# Patient Record
Sex: Female | Born: 1955 | Race: Black or African American | Hispanic: No | Smoking: Never smoker
Health system: Southern US, Community
[De-identification: ages and names within clinical notes are randomized; demographics above are authoritative.]

## PROBLEM LIST (undated history)

## (undated) DIAGNOSIS — A0472 Enterocolitis due to Clostridium difficile, not specified as recurrent: Secondary | ICD-10-CM

## (undated) DIAGNOSIS — M1990 Unspecified osteoarthritis, unspecified site: Secondary | ICD-10-CM

## (undated) DIAGNOSIS — I10 Essential (primary) hypertension: Secondary | ICD-10-CM

## (undated) DIAGNOSIS — K5190 Ulcerative colitis, unspecified, without complications: Secondary | ICD-10-CM

## (undated) DIAGNOSIS — E785 Hyperlipidemia, unspecified: Secondary | ICD-10-CM

## (undated) DIAGNOSIS — I219 Acute myocardial infarction, unspecified: Secondary | ICD-10-CM

## (undated) HISTORY — DX: Enterocolitis due to Clostridium difficile, not specified as recurrent: A04.72

## (undated) HISTORY — DX: Hyperlipidemia, unspecified: E78.5

## (undated) HISTORY — PX: TONSILLECTOMY: SUR1361

## (undated) HISTORY — DX: Ulcerative colitis, unspecified, without complications: K51.90

## (undated) HISTORY — DX: Unspecified osteoarthritis, unspecified site: M19.90

## (undated) HISTORY — DX: Acute myocardial infarction, unspecified: I21.9

## (undated) HISTORY — DX: Essential (primary) hypertension: I10

---

## 2008-10-22 HISTORY — PX: TOTAL KNEE ARTHROPLASTY: SHX125

## 2008-10-24 DIAGNOSIS — I219 Acute myocardial infarction, unspecified: Secondary | ICD-10-CM

## 2008-10-24 HISTORY — DX: Acute myocardial infarction, unspecified: I21.9

## 2017-10-05 ENCOUNTER — Encounter: Payer: Self-pay | Admitting: Gastroenterology

## 2017-10-08 ENCOUNTER — Encounter: Payer: Self-pay | Admitting: Gastroenterology

## 2017-10-08 ENCOUNTER — Ambulatory Visit: Payer: BLUE CROSS/BLUE SHIELD | Admitting: Gastroenterology

## 2017-10-08 ENCOUNTER — Other Ambulatory Visit (INDEPENDENT_AMBULATORY_CARE_PROVIDER_SITE_OTHER): Payer: BLUE CROSS/BLUE SHIELD

## 2017-10-08 VITALS — BP 100/70 | HR 104 | Ht 65.5 in | Wt 213.5 lb

## 2017-10-08 DIAGNOSIS — R112 Nausea with vomiting, unspecified: Secondary | ICD-10-CM

## 2017-10-08 DIAGNOSIS — K921 Melena: Secondary | ICD-10-CM

## 2017-10-08 DIAGNOSIS — R197 Diarrhea, unspecified: Secondary | ICD-10-CM

## 2017-10-08 DIAGNOSIS — R1030 Lower abdominal pain, unspecified: Secondary | ICD-10-CM | POA: Diagnosis not present

## 2017-10-08 LAB — BASIC METABOLIC PANEL
BUN: 17 mg/dL (ref 6–23)
CALCIUM: 9.7 mg/dL (ref 8.4–10.5)
CO2: 30 meq/L (ref 19–32)
Chloride: 98 mEq/L (ref 96–112)
Creatinine, Ser: 1.55 mg/dL — ABNORMAL HIGH (ref 0.40–1.20)
GFR: 43.54 mL/min — AB (ref >60.00)
GLUCOSE: 121 mg/dL — AB (ref 70–99)
POTASSIUM: 3.9 meq/L (ref 3.5–5.1)
SODIUM: 136 meq/L (ref 135–145)

## 2017-10-08 LAB — CBC WITH DIFFERENTIAL/PLATELET
BASOS ABS: 0 10*3/uL (ref 0.0–0.1)
Basophils Relative: 0.2 % (ref 0.0–3.0)
EOS PCT: 0.9 % (ref 0.0–5.0)
Eosinophils Absolute: 0.1 10*3/uL (ref 0.0–0.7)
HEMATOCRIT: 38.9 % (ref 36.0–46.0)
Hemoglobin: 13 g/dL (ref 12.0–15.0)
LYMPHS ABS: 1.7 10*3/uL (ref 0.7–4.0)
LYMPHS PCT: 17.3 % (ref 12.0–46.0)
MCHC: 33.3 g/dL (ref 30.0–36.0)
MCV: 90.7 fl (ref 78.0–100.0)
MONOS PCT: 11.1 % (ref 3.0–12.0)
Monocytes Absolute: 1.1 10*3/uL — ABNORMAL HIGH (ref 0.1–1.0)
NEUTROS ABS: 7.1 10*3/uL (ref 1.4–7.7)
NEUTROS PCT: 70.5 % (ref 43.0–77.0)
Platelets: 536 10*3/uL — ABNORMAL HIGH (ref 150.0–400.0)
RBC: 4.29 Mil/uL (ref 3.87–5.11)
RDW: 14.2 % (ref 11.5–15.5)
WBC: 10.1 10*3/uL (ref 4.0–10.5)

## 2017-10-08 LAB — SEDIMENTATION RATE: Sed Rate: 85 mm/hr — ABNORMAL HIGH (ref 0–30)

## 2017-10-08 MED ORDER — ONDANSETRON HCL 4 MG PO TABS: 4.0000 mg | ORAL_TABLET | Freq: Three times a day (TID) | ORAL | 1 refills | Status: AC | PRN

## 2017-10-08 MED ORDER — DICYCLOMINE HCL 10 MG PO CAPS: 10.0000 mg | ORAL_CAPSULE | Freq: Three times a day (TID) | ORAL | 0 refills | Status: AC | PRN

## 2017-10-08 MED ORDER — HYDROCORTISONE ACETATE 25 MG RE SUPP
25.0000 mg | Freq: Two times a day (BID) | RECTAL | 0 refills | Status: DC
Start: 2017-10-08 — End: 2017-10-18

## 2017-10-08 NOTE — Patient Instructions (Signed)
If you are age 62 or older, your body mass index should be between 23-30. Your Body mass index is 34.99 kg/m. If this is out of the aforementioned range listed, please consider follow up with your Primary Care Provider.  If you are age 59 or younger, your body mass index should be between 19-25. Your Body mass index is 34.99 kg/m. If this is out of the aformentioned range listed, please consider follow up with your Primary Care Provider.   Your provider has requested that you go to the basement level for lab work before leaving today. Press "B" on the elevator. The lab is located at the first door on the left as you exit the elevator.  It was a pleasure to see you today!  Dr. Loletha Carrow

## 2017-10-08 NOTE — Progress Notes (Signed)
[facility] Consult Note:  History: [name] 10/08/2017  Referring physician: Self-referred  Reason for consult/chief complaint: Ulcerative Colitis (pt thinks she is having a flare); Diarrhea (blood mixed in with stools); and nausea and vomiting   Subjective  HPI:  This is a very pleasant 62 year old woman self-referred for abdominal pain, vomiting and rectal bleeding.  She moved here several months ago and has not yet established primary care.  She reports a history of "colitis" dating back to perhaps 2011.  It was most recently cared for at the [name].  She gave [place] the name of the last physician she saw there, and unfortunately we have no records available today.  She describes getting "flares" that would occur every few years, and be manifest by crampy lower abdominal pain, protracted nausea and vomiting, sometimes to the point of volume depletion, and bloody diarrhea.  She was treated with antibiotics on at least one occasion for unclear reasons.  When I mention having to test for infection, she did not recall having been positive for C. difficile on one occasion.  At some point she was given prednisone but said it only made her worse.  Her last colonoscopy was in December 2017, and Dr.Cross told her there was "some inflammation".Takhia believe she had a colonoscopy perhaps a year prior to that as well.  She also believes these flares have often been triggered by stress.  About 2 weeks ago she started having the same constellation of symptoms, with crampy lower abdominal pain, bloody loose stool, nausea and vomiting and decreased oral intake.  She has not lately been traveling or had antibiotic use.   ROS:  Review of Systems  Constitutional: Negative for appetite change and unexpected weight change.  HENT: Negative for mouth sores and voice change.   Eyes: Negative for pain and redness.  Respiratory: Negative for cough and shortness of breath.     Cardiovascular: Negative for chest pain and palpitations.  Genitourinary: Negative for dysuria and hematuria.  Musculoskeletal: Positive for arthralgias. Negative for myalgias.  Skin: Negative for pallor and rash.  Neurological: Negative for weakness and headaches.  Hematological: Negative for adenopathy.     Past Medical History: Past Medical History:  Diagnosis Date  . Arthritis   . C. difficile diarrhea   . Heart attack ([place]) 10/24/2008  . HLD (hyperlipidemia)   . HTN (hypertension)   . UC (ulcerative colitis) [facility])      Past Surgical History: Past Surgical History:  Procedure Laterality Date  . CESAREAN SECTION     x 3  . TONSILLECTOMY     age 78  . TOTAL KNEE ARTHROPLASTY Bilateral 10/22/2008     Family History: Family History  Problem Relation Age of Onset  . Stroke Mother   . Congestive Heart Failure Father   . Obesity Sister   . Hypertension Sister   . Diabetes Brother   . Thyroid disease Daughter     Social History: Social History   Socioeconomic History  . Marital status: Unknown    Spouse name: Not on file  . Number of children: 3  . Years of education: Not on file  . Highest education level: Not on file  Occupational History  . Occupation: LPN  Social Needs  . Financial resource strain: Not on file  . Food insecurity:    Worry: Not on file    Inability: Not on file  . Transportation needs:    Medical: Not on file  Non-medical: Not on file  Tobacco Use  . Smoking status: Never Smoker  . Smokeless tobacco: Never Used  Substance and Sexual Activity  . Alcohol use: Not Currently  . Drug use: Never  . Sexual activity: Not on file  Lifestyle  . Physical activity:    Days per week: Not on file    Minutes per session: Not on file  . Stress: Not on file  Relationships  . Social connections:    Talks on phone: Not on file    Gets together: Not on file    Attends religious service: Not on file    Active member of club or organization:  Not on file    Attends meetings of clubs or organizations: Not on file    Relationship status: Not on file  Other Topics Concern  . Not on file  Social History Narrative  . Not on file   She is widowed, and moved here recently to be closer to family before her upcoming retirement. Allergies: Allergies  Allergen Reactions  . Prednisone Other (See Comments)    Tachycardia, nervous, sweating , jitters    Outpatient Meds: Current Outpatient Medications  Medication Sig Dispense Refill  . acetaminophen (TYLENOL) 500 MG tablet Take 500 mg by mouth as needed.    . Calcium Carb-Cholecalciferol (CALCIUM-VITAMIN D) 600-400 MG-UNIT TABS Take 1 tablet by mouth daily.    . metoprolol succinate (TOPROL-XL) 50 MG 24 hr tablet Take 50 mg by mouth daily. Take with or immediately following a meal.    . Multiple Vitamin (MULTIVITAMIN) tablet Take 1 tablet by mouth daily.    Marland Kitchen dicyclomine (BENTYL) 10 MG capsule Take 1 capsule (10 mg total) by mouth every 8 (eight) hours as needed for spasms. 90 capsule 0  . hydrocortisone (ANUSOL-HC) 25 MG suppository Place 1 suppository (25 mg total) rectally 2 (two) times daily. 14 suppository 0  . ondansetron (ZOFRAN) 4 MG tablet Take 1 tablet (4 mg total) by mouth every 8 (eight) hours as needed for nausea or vomiting. 30 tablet 1   No current facility-administered medications for this visit.       ___________________________________________________________________ Objective   Exam:  BP 100/70 (BP Location: Left Arm, Patient Position: Sitting, Cuff Size: Large)   Pulse (!) 104   Ht 5' 5.5" (1.664 m) Comment: height measured without shoes  Wt 213 lb 8 oz (96.8 kg)   BMI 34.99 kg/m    General: this is a(n) pleasant and somewhat anxious appearing, female patient, nontoxic, no acute distress  Eyes: sclera anicteric, no redness  ENT: oral mucosa moist without lesions, no cervical or supraclavicular lymphadenopathy, good dentition  CV: RRR without murmur,  S1/S2, no JVD, no peripheral edema  Resp: clear to auscultation bilaterally, normal RR and effort noted  GI: soft, mild lower tenderness, with active bowel sounds. No guarding or palpable organomegaly noted.  Skin; warm and dry, no rash or jaundice noted.  Venous stasis changes legs  Neuro: awake, alert and oriented x 3. Normal gross motor function and fluent speech Rectal: Discomfort, irregular texture to the rectal mucosa, blood on the glove.  She then had the urgent need for a bowel movement and had to go immediately to the bathroom across the hall, and had semi-formed to loose stool with rust colored blood.  No data for review, no prior records for review  Assessment: Encounter Diagnoses  Name Primary?  . Lower abdominal pain Yes  . Acute diarrhea   . Hematochezia   .  Nausea and vomiting in adult     Reported history of colitis, though not clear if it was truly ulcerative colitis.  If so, she was apparently not on chronic therapy for unknown reasons.  She seemed to recall perhaps had not been prescribed some medicine she took for a while, but then had to stop due to expense.  Unknown how often she was following with GI.  She also reports intolerance to prednisone in the past, and seemed to recall it actually made her worse. She does not appear volume depleted of any acute distress right now.  I am hoping we can get some records soon to understand her history.  I think she is likely need a colonoscopy very soon, but I would like to make sure there is no infection first.  Plan:  Lab today for CBC, BMP, ESR, stool for C. difficile PCR I prescribed ondansetron because she reports it has helped in the past with nausea and vomiting.  Hopefully this will prevent volume depletion and need for emergency room. As needed dicyclomine for cramps and urgency. Hydrocortisone suppository 25 mg twice a day for a week.  By then we will receive lab reports and hopefully some records to know how to  proceed.  Thank you for the courtesy of this consult.  Please call me with any questions or concerns.  Nelida Meuse III

## 2017-10-11 ENCOUNTER — Telehealth: Payer: Self-pay | Admitting: Gastroenterology

## 2017-10-11 ENCOUNTER — Other Ambulatory Visit: Payer: BLUE CROSS/BLUE SHIELD

## 2017-10-11 ENCOUNTER — Other Ambulatory Visit: Payer: Self-pay

## 2017-10-11 DIAGNOSIS — K921 Melena: Secondary | ICD-10-CM

## 2017-10-11 DIAGNOSIS — R197 Diarrhea, unspecified: Secondary | ICD-10-CM

## 2017-10-11 DIAGNOSIS — R1030 Lower abdominal pain, unspecified: Secondary | ICD-10-CM

## 2017-10-11 DIAGNOSIS — R112 Nausea with vomiting, unspecified: Secondary | ICD-10-CM

## 2017-10-11 NOTE — Telephone Encounter (Signed)
Patient turned in specimen this morning wanting to know if the results were available yet. Let her know that I did not see anything completed yet but when we do we will contact her. Also her insurance does not cover the Anusol suppositories and wanted to know what else could be prescribed.

## 2017-10-12 ENCOUNTER — Emergency Department (HOSPITAL_COMMUNITY): Payer: BLUE CROSS/BLUE SHIELD

## 2017-10-12 ENCOUNTER — Encounter (HOSPITAL_COMMUNITY): Payer: Self-pay | Admitting: Emergency Medicine

## 2017-10-12 ENCOUNTER — Other Ambulatory Visit: Payer: Self-pay

## 2017-10-12 ENCOUNTER — Inpatient Hospital Stay (HOSPITAL_COMMUNITY)
Admission: EM | Admit: 2017-10-12 | Discharge: 2017-10-18 | DRG: 386 | Disposition: A | Discharge status: Home / Self Care | Payer: BLUE CROSS/BLUE SHIELD | Attending: Internal Medicine | Admitting: Internal Medicine

## 2017-10-12 DIAGNOSIS — K5190 Ulcerative colitis, unspecified, without complications: Secondary | ICD-10-CM | POA: Diagnosis not present

## 2017-10-12 DIAGNOSIS — E860 Dehydration: Secondary | ICD-10-CM | POA: Diagnosis present

## 2017-10-12 DIAGNOSIS — Z888 Allergy status to other drugs, medicaments and biological substances status: Secondary | ICD-10-CM

## 2017-10-12 DIAGNOSIS — R112 Nausea with vomiting, unspecified: Secondary | ICD-10-CM

## 2017-10-12 DIAGNOSIS — K921 Melena: Secondary | ICD-10-CM | POA: Diagnosis not present

## 2017-10-12 DIAGNOSIS — K625 Hemorrhage of anus and rectum: Secondary | ICD-10-CM | POA: Diagnosis not present

## 2017-10-12 DIAGNOSIS — R739 Hyperglycemia, unspecified: Secondary | ICD-10-CM | POA: Diagnosis present

## 2017-10-12 DIAGNOSIS — E785 Hyperlipidemia, unspecified: Secondary | ICD-10-CM | POA: Diagnosis present

## 2017-10-12 DIAGNOSIS — D72829 Elevated white blood cell count, unspecified: Secondary | ICD-10-CM | POA: Diagnosis present

## 2017-10-12 DIAGNOSIS — K6289 Other specified diseases of anus and rectum: Secondary | ICD-10-CM | POA: Diagnosis not present

## 2017-10-12 DIAGNOSIS — R197 Diarrhea, unspecified: Secondary | ICD-10-CM | POA: Diagnosis not present

## 2017-10-12 DIAGNOSIS — Z79899 Other long term (current) drug therapy: Secondary | ICD-10-CM | POA: Diagnosis not present

## 2017-10-12 DIAGNOSIS — I10 Essential (primary) hypertension: Secondary | ICD-10-CM | POA: Diagnosis present

## 2017-10-12 DIAGNOSIS — R1084 Generalized abdominal pain: Secondary | ICD-10-CM | POA: Diagnosis not present

## 2017-10-12 DIAGNOSIS — I252 Old myocardial infarction: Secondary | ICD-10-CM | POA: Diagnosis not present

## 2017-10-12 DIAGNOSIS — K51811 Other ulcerative colitis with rectal bleeding: Secondary | ICD-10-CM | POA: Diagnosis present

## 2017-10-12 DIAGNOSIS — K51011 Ulcerative (chronic) pancolitis with rectal bleeding: Secondary | ICD-10-CM | POA: Diagnosis not present

## 2017-10-12 DIAGNOSIS — N179 Acute kidney failure, unspecified: Secondary | ICD-10-CM | POA: Diagnosis present

## 2017-10-12 DIAGNOSIS — D473 Essential (hemorrhagic) thrombocythemia: Secondary | ICD-10-CM | POA: Diagnosis not present

## 2017-10-12 DIAGNOSIS — R933 Abnormal findings on diagnostic imaging of other parts of digestive tract: Secondary | ICD-10-CM | POA: Diagnosis not present

## 2017-10-12 DIAGNOSIS — Z96653 Presence of artificial knee joint, bilateral: Secondary | ICD-10-CM | POA: Diagnosis present

## 2017-10-12 DIAGNOSIS — D75839 Thrombocytosis, unspecified: Secondary | ICD-10-CM | POA: Diagnosis present

## 2017-10-12 DIAGNOSIS — K51911 Ulcerative colitis, unspecified with rectal bleeding: Secondary | ICD-10-CM | POA: Diagnosis not present

## 2017-10-12 DIAGNOSIS — K644 Residual hemorrhoidal skin tags: Secondary | ICD-10-CM | POA: Diagnosis not present

## 2017-10-12 DIAGNOSIS — K529 Noninfective gastroenteritis and colitis, unspecified: Secondary | ICD-10-CM | POA: Diagnosis not present

## 2017-10-12 LAB — COMPREHENSIVE METABOLIC PANEL
ALK PHOS: 100 U/L (ref 38–126)
ALT: 14 U/L (ref 0–44)
AST: 16 U/L (ref 15–41)
Albumin: 2.7 g/dL — ABNORMAL LOW (ref 3.5–5.0)
Anion gap: 13 (ref 5–15)
BILIRUBIN TOTAL: 0.7 mg/dL (ref 0.3–1.2)
BUN: 15 mg/dL (ref 8–23)
CALCIUM: 9.6 mg/dL (ref 8.9–10.3)
CO2: 29 mmol/L (ref 22–32)
CREATININE: 1.61 mg/dL — AB (ref 0.44–1.00)
Chloride: 93 mmol/L — ABNORMAL LOW (ref 98–111)
GFR calc non Af Amer: 33 mL/min — ABNORMAL LOW (ref >60)
GFR, EST AFRICAN AMERICAN: 39 mL/min — AB (ref >60)
GLUCOSE: 119 mg/dL — AB (ref 70–99)
Potassium: 4.1 mmol/L (ref 3.5–5.1)
SODIUM: 135 mmol/L (ref 135–145)
Total Protein: 6.6 g/dL (ref 6.5–8.1)

## 2017-10-12 LAB — CBC WITH DIFFERENTIAL/PLATELET
Basophils Absolute: 0 10*3/uL (ref 0.0–0.1)
Basophils Relative: 0 %
EOS ABS: 0.1 10*3/uL (ref 0.0–0.7)
EOS PCT: 1 %
HCT: 42.1 % (ref 36.0–46.0)
Hemoglobin: 14.4 g/dL (ref 12.0–15.0)
LYMPHS ABS: 2.2 10*3/uL (ref 0.7–4.0)
Lymphocytes Relative: 17 %
MCH: 30.8 pg (ref 26.0–34.0)
MCHC: 34.2 g/dL (ref 30.0–36.0)
MCV: 90.1 fL (ref 78.0–100.0)
Monocytes Absolute: 1.2 10*3/uL (ref 0.1–1.0)
Monocytes Relative: 9 %
Neutro Abs: 9.5 10*3/uL (ref 1.7–7.7)
Neutrophils Relative %: 73 %
PLATELETS: 555 10*3/uL — AB (ref 150–400)
RBC: 4.67 MIL/uL (ref 3.87–5.11)
RDW: 13.8 % (ref 11.5–15.5)
WBC: 12.9 10*3/uL — AB (ref 4.0–10.5)

## 2017-10-12 LAB — GASTROINTESTINAL PANEL BY PCR, STOOL (REPLACES STOOL CULTURE)
Adenovirus F40/41: NOT DETECTED
Astrovirus: NOT DETECTED
CAMPYLOBACTER SPECIES: NOT DETECTED
Cryptosporidium: NOT DETECTED
Cyclospora cayetanensis: NOT DETECTED
ENTEROAGGREGATIVE E COLI (EAEC): NOT DETECTED
Entamoeba histolytica: NOT DETECTED
Enteropathogenic E coli (EPEC): NOT DETECTED
Enterotoxigenic E coli (ETEC): NOT DETECTED
Giardia lamblia: NOT DETECTED
NOROVIRUS GI/GII: NOT DETECTED
PLESIMONAS SHIGELLOIDES: NOT DETECTED
Rotavirus A: NOT DETECTED
SALMONELLA SPECIES: NOT DETECTED
SHIGA LIKE TOXIN PRODUCING E COLI (STEC): NOT DETECTED
SHIGELLA/ENTEROINVASIVE E COLI (EIEC): NOT DETECTED
Sapovirus (I, II, IV, and V): NOT DETECTED
Vibrio cholerae: NOT DETECTED
Vibrio species: NOT DETECTED
Yersinia enterocolitica: NOT DETECTED

## 2017-10-12 LAB — C DIFFICILE QUICK SCREEN W PCR REFLEX
C DIFFICILE (CDIFF) INTERP: NOT DETECTED
C DIFFICLE (CDIFF) ANTIGEN: NEGATIVE
C Diff toxin: NEGATIVE

## 2017-10-12 LAB — TYPE AND SCREEN
ABO/RH(D): O POS
Antibody Screen: NEGATIVE

## 2017-10-12 LAB — URINALYSIS, ROUTINE W REFLEX MICROSCOPIC
Bilirubin Urine: NEGATIVE
Glucose, UA: NEGATIVE mg/dL
Hgb urine dipstick: NEGATIVE
KETONES UR: 20 mg/dL — AB
LEUKOCYTES UA: NEGATIVE
NITRITE: NEGATIVE
Protein, ur: NEGATIVE mg/dL
Specific Gravity, Urine: >1.046 — ABNORMAL HIGH (ref 1.005–1.030)
pH: 5 (ref 5.0–8.0)

## 2017-10-12 LAB — SEDIMENTATION RATE: SED RATE: 0 mm/h (ref 0–22)

## 2017-10-12 LAB — MAGNESIUM: MAGNESIUM: 1.8 mg/dL (ref 1.7–2.4)

## 2017-10-12 LAB — LIPASE, BLOOD: Lipase: 22 U/L (ref 11–51)

## 2017-10-12 LAB — CLOSTRIDIUM DIFFICILE TOXIN B, QUALITATIVE, REAL-TIME PCR: CDIFFPCR: NOT DETECTED

## 2017-10-12 LAB — PROTIME-INR
INR: 0.98
PROTHROMBIN TIME: 12.9 s (ref 11.4–15.2)

## 2017-10-12 LAB — I-STAT CG4 LACTIC ACID, ED: LACTIC ACID, VENOUS: 1.82 mmol/L (ref 0.5–1.9)

## 2017-10-12 LAB — OCCULT BLOOD X 1 CARD TO LAB, STOOL: FECAL OCCULT BLD: POSITIVE — AB

## 2017-10-12 LAB — PHOSPHORUS: PHOSPHORUS: 3.8 mg/dL (ref 2.5–4.6)

## 2017-10-12 LAB — ABO/RH: ABO/RH(D): O POS

## 2017-10-12 MED ORDER — ACETAMINOPHEN 500 MG PO TABS
500.0000 mg | ORAL_TABLET | Freq: Four times a day (QID) | ORAL | Status: DC | PRN
  Administered 2017-10-18: 500 mg via ORAL
  Filled 2017-10-12: qty 1

## 2017-10-12 MED ORDER — IOPAMIDOL (ISOVUE-300) INJECTION 61%: 100.0000 mL | Freq: Once | INTRAVENOUS | Status: DC | PRN

## 2017-10-12 MED ORDER — HYDROMORPHONE HCL 1 MG/ML IJ SOLN
1.0000 mg | INTRAMUSCULAR | Status: DC | PRN
  Administered 2017-10-12 – 2017-10-17 (×14): 1 mg via INTRAVENOUS
  Filled 2017-10-12 (×14): qty 1

## 2017-10-12 MED ORDER — FAMOTIDINE IN NACL 20-0.9 MG/50ML-% IV SOLN
20.0000 mg | Freq: Once | INTRAVENOUS | Status: AC
  Administered 2017-10-12: 20 mg via INTRAVENOUS
  Filled 2017-10-12: qty 50

## 2017-10-12 MED ORDER — SODIUM CHLORIDE 0.9 % IV SOLN
INTRAVENOUS | Status: DC
  Administered 2017-10-13: 09:00:00 via INTRAVENOUS

## 2017-10-12 MED ORDER — IOHEXOL 300 MG/ML  SOLN
30.0000 mL | Freq: Once | INTRAMUSCULAR | Status: AC | PRN
  Administered 2017-10-12: 30 mL via ORAL

## 2017-10-12 MED ORDER — DICYCLOMINE HCL 10 MG PO CAPS
10.0000 mg | ORAL_CAPSULE | Freq: Three times a day (TID) | ORAL | Status: DC | PRN
  Filled 2017-10-12: qty 1

## 2017-10-12 MED ORDER — ADULT MULTIVITAMIN W/MINERALS CH
1.0000 | ORAL_TABLET | Freq: Every day | ORAL | Status: DC
  Administered 2017-10-12 – 2017-10-18 (×7): 1 via ORAL
  Filled 2017-10-12 (×7): qty 1

## 2017-10-12 MED ORDER — ONDANSETRON HCL 4 MG/2ML IJ SOLN
4.0000 mg | Freq: Four times a day (QID) | INTRAMUSCULAR | Status: DC | PRN
  Administered 2017-10-12 – 2017-10-18 (×15): 4 mg via INTRAVENOUS
  Filled 2017-10-12 (×15): qty 2

## 2017-10-12 MED ORDER — HYDROCORTISONE ACETATE 25 MG RE SUPP
25.0000 mg | Freq: Two times a day (BID) | RECTAL | Status: DC
  Administered 2017-10-13 – 2017-10-18 (×11): 25 mg via RECTAL
  Filled 2017-10-12 (×14): qty 1

## 2017-10-12 MED ORDER — SODIUM CHLORIDE 0.9 % IV SOLN
1000.0000 mL | INTRAVENOUS | Status: DC
  Administered 2017-10-12: 1000 mL via INTRAVENOUS

## 2017-10-12 MED ORDER — SODIUM CHLORIDE 0.9 % IV BOLUS (SEPSIS)
1000.0000 mL | Freq: Once | INTRAVENOUS | Status: AC
  Administered 2017-10-12: 1000 mL via INTRAVENOUS

## 2017-10-12 MED ORDER — IOHEXOL 300 MG/ML  SOLN
75.0000 mL | Freq: Once | INTRAMUSCULAR | Status: AC | PRN
  Administered 2017-10-12: 75 mL via INTRAVENOUS

## 2017-10-12 MED ORDER — METOPROLOL SUCCINATE ER 50 MG PO TB24
50.0000 mg | ORAL_TABLET | Freq: Every day | ORAL | Status: DC
  Administered 2017-10-12 – 2017-10-18 (×7): 50 mg via ORAL
  Filled 2017-10-12 (×7): qty 1

## 2017-10-12 MED ORDER — ONDANSETRON HCL 4 MG PO TABS
4.0000 mg | ORAL_TABLET | Freq: Four times a day (QID) | ORAL | Status: DC | PRN
  Administered 2017-10-18: 4 mg via ORAL
  Filled 2017-10-12: qty 1

## 2017-10-12 MED ORDER — PEG-KCL-NACL-NASULF-NA ASC-C 100 G PO SOLR
0.5000 | Freq: Once | ORAL | Status: AC
  Administered 2017-10-12: 100 g via ORAL
  Filled 2017-10-12: qty 1

## 2017-10-12 MED ORDER — METOCLOPRAMIDE HCL 5 MG/ML IJ SOLN
10.0000 mg | Freq: Once | INTRAMUSCULAR | Status: AC
  Administered 2017-10-12: 10 mg via INTRAVENOUS
  Filled 2017-10-12: qty 2

## 2017-10-12 MED ORDER — SODIUM CHLORIDE 0.9 % IV SOLN
INTRAVENOUS | Status: DC
  Administered 2017-10-12 – 2017-10-13 (×4): via INTRAVENOUS

## 2017-10-12 MED ORDER — SODIUM CHLORIDE 0.9 % IV BOLUS: 1000.0000 mL | Freq: Once | INTRAVENOUS | Status: DC

## 2017-10-12 MED ORDER — ONDANSETRON HCL 4 MG/2ML IJ SOLN
4.0000 mg | Freq: Once | INTRAMUSCULAR | Status: AC
  Administered 2017-10-12: 4 mg via INTRAVENOUS
  Filled 2017-10-12: qty 2

## 2017-10-12 MED ORDER — HYDROMORPHONE HCL 1 MG/ML IJ SOLN
1.0000 mg | Freq: Once | INTRAMUSCULAR | Status: AC
  Administered 2017-10-12: 1 mg via INTRAVENOUS
  Filled 2017-10-12: qty 1

## 2017-10-12 NOTE — ED Provider Notes (Signed)
[facility] DEPT Provider Note   CSN: [id number] Arrival date & time: 10/12/17  4098     History   Chief Complaint Chief Complaint  Patient presents with  . Rectal Bleeding    HPI [name] is a 62 y.o. female.  HPI Patient reports she has history of ulcerative colitis.  This is been for a number of years.  She has recently moved to this area and was seen by [facility] gastroenterology where evaluation and diagnostic studies are being reinitiated.  Patient reports that she saw them 4 days ago because she has been having a flareup of her symptoms.  She reports this is been going on for about 2 weeks now.  She has had between 10 and 7 bloody diarrheal bowel movements per day.  She has had persistent cramping lower abdominal pain.  She has also had vomiting.  She reports she has been unable to tolerate food or fluids.  There has been a lot of nausea.  Initially she reports her emesis was yellow.  She reports now is mostly clear frothy.  She denies she is had any bloody or coffee-ground looking emesis.  She reports she is gotten very weak.  She has not had a fall or a syncopal episode.  She was prescribed Zofran and Bentyl from gastroenterology.  She reports that she has not been able to keep them down and she continues to have vomiting and abdominal pain.  She was prescribed Anusol which she was not able to start as it was not available at the pharmacy.  She reports 3 prior C-sections and no other intra-abdominal surgeries. Past Medical History:  Diagnosis Date  . Arthritis   . C. difficile diarrhea   . Heart attack ([place]) 10/24/2008  . HLD (hyperlipidemia)   . HTN (hypertension)   . UC (ulcerative colitis) ([place])     There are no active problems to display for this patient.   Past Surgical History:  Procedure Laterality Date  . CESAREAN SECTION     x 3  . TONSILLECTOMY     age 28  . TOTAL KNEE ARTHROPLASTY Bilateral 10/22/2008     OB History   None      Home Medications    Prior to Admission medications   Medication Sig Start Date End Date Taking? Authorizing Provider  acetaminophen (TYLENOL) 500 MG tablet Take 500 mg by mouth every 6 (six) hours as needed (For arthritis pain.).    Yes [provider]  Calcium Carb-Cholecalciferol (CALCIUM-VITAMIN D) 600-400 MG-UNIT TABS Take 1 tablet by mouth daily.   Yes [provider]  dicyclomine (BENTYL) 10 MG capsule Take 1 capsule (10 mg total) by mouth every 8 (eight) hours as needed for spasms. 10/08/17  Yes Danis, Kirke Corin, MD  metoprolol succinate (TOPROL-XL) 50 MG 24 hr tablet Take 50 mg by mouth daily. Take with or immediately following a meal.   Yes [provider]  Multiple Vitamin (MULTIVITAMIN) tablet Take 1 tablet by mouth daily.   Yes [provider]  ondansetron (ZOFRAN) 4 MG tablet Take 1 tablet (4 mg total) by mouth every 8 (eight) hours as needed for nausea or vomiting. 10/08/17  Yes Danis, Kirke Corin, MD  hydrocortisone (ANUSOL-HC) 25 MG suppository Place 1 suppository (25 mg total) rectally 2 (two) times daily. Patient not taking: Reported on 10/12/2017 10/08/17   Doran Stabler, MD    Family History Family History  Problem Relation Age of Onset  .  Stroke Mother   . Congestive Heart Failure Father   . Obesity Sister   . Hypertension Sister   . Diabetes Brother   . Thyroid disease Daughter     Social History Social History   Tobacco Use  . Smoking status: Never Smoker  . Smokeless tobacco: Never Used  Substance Use Topics  . Alcohol use: Not Currently  . Drug use: Never     Allergies   Prednisone   Review of Systems Review of Systems 10 Systems reviewed and are negative for acute change except as noted in the HPI.  Physical Exam Updated Vital Signs BP (!) 134/92   Pulse (!) 107   Temp 99.8 F (37.7 C) (Oral)   Resp 16   Ht 5\' 7"  (1.702 m)   Wt 97.5 kg   SpO2 98%   BMI 33.67 kg/m   Physical Exam   Constitutional: She is oriented to person, place, and time.  Patient is alert and nontoxic.  She does appear slightly pale and uncomfortable.  Mental status is clear.  No respiratory distress.  HENT:  Head: Normocephalic and atraumatic.  Mucous membranes slightly dry.  Posterior oropharynx widely patent.  Eyes: Conjunctivae and EOM are normal.  Neck: Neck supple.  Cardiovascular:  Borderline tachycardia.  No gross rub murmur gallop.  Pulmonary/Chest: Effort normal and breath sounds normal.  Abdominal: Soft. Bowel sounds are normal. She exhibits no distension.  Patient reports lower abdomen is persistently uncomfortable.  She however reports palpation does not make it much worse.  No upper abdominal tenderness to palpation.  No guarding.  Musculoskeletal: Normal range of motion. She exhibits no edema or tenderness.  Neurological: She is alert and oriented to person, place, and time. She exhibits normal muscle tone. Coordination normal.  Skin: Skin is warm and dry.  Psychiatric: She has a normal mood and affect.     ED Treatments / Results  Labs (all labs ordered are listed, but only abnormal results are displayed) Labs Reviewed  COMPREHENSIVE METABOLIC PANEL - Abnormal; Notable for the following components:      Result Value   Chloride 93 (*)    Glucose, Bld 119 (*)    Creatinine, Ser 1.61 (*)    Albumin 2.7 (*)    GFR calc non Af Amer 33 (*)    GFR calc Af Amer 39 (*)    All other components within normal limits  OCCULT BLOOD X 1 CARD TO LAB, STOOL - Abnormal; Notable for the following components:   Fecal Occult Bld POSITIVE (*)    All other components within normal limits  CBC WITH DIFFERENTIAL/PLATELET - Abnormal; Notable for the following components:   WBC 12.9 (*)    Platelets 555 (*)    All other components within normal limits  C DIFFICILE QUICK SCREEN W PCR REFLEX  GASTROINTESTINAL PANEL BY PCR, STOOL (REPLACES STOOL CULTURE)  LIPASE, BLOOD  PROTIME-INR  CBC WITH  DIFFERENTIAL/PLATELET  URINALYSIS, ROUTINE W REFLEX MICROSCOPIC  I-STAT CG4 LACTIC ACID, ED  I-STAT CG4 LACTIC ACID, ED  TYPE AND SCREEN  ABO/RH    EKG EKG Interpretation  Date/Time:  Tuesday October 12 2017 08:42:22 EDT Ventricular Rate:  109 PR Interval:    QRS Duration: 86 QT Interval:  333 QTC Calculation: 449 R Axis:   9 Text Interpretation:  Sinus tachycardia Consider left atrial enlargement Abnormal R-wave progression, early transition LVH by voltage agree, no acute ischemia, no old comparison Confirmed by Charlesetta Shanks [phone]) on 10/12/2017 8:47:13 AM  Radiology Ct Abdomen Pelvis W Contrast  Result Date: 10/12/2017 CLINICAL DATA:  Nausea and vomiting for 2 weeks, history of ulcerative colitis, patient could not drink oral contrast due to vomiting EXAM: CT ABDOMEN AND PELVIS WITH CONTRAST TECHNIQUE: Multidetector CT imaging of the abdomen and pelvis was performed using the standard protocol following bolus administration of intravenous contrast. CONTRAST:  4mL OMNIPAQUE IOHEXOL 300 MG/ML  SOLN COMPARISON:  None. FINDINGS: Lower chest: The lung bases are clear. The heart is within normal limits in size. No pericardial effusion is seen. Hepatobiliary: Tiny subcentimeter low-attenuation structures in the liver most consistent with a benign process such as cysts. No focal hepatic abnormality is seen. The gallbladder is slightly distended but no calcified gallstones are seen. Pancreas: The pancreas is normal in size and the pancreatic duct is not dilated. Spleen: The spleen is unremarkable. Adrenals/Urinary Tract: The adrenal glands appear normal. The kidneys enhance with no mass or calculus. On delayed images, the pelvocaliceal systems are unremarkable. Ureters appear normal in caliber. The urinary bladder is not well distended but no abnormality is noted. Stomach/Bowel: The stomach is completely decompressed and cannot be well assessed. No abnormality of small bowel is seen. The colon  however is completely decompressed and does appear to be somewhat edematous diffusely. Diffuse colitis is a definite consideration. No focal abscess is seen. The terminal ileum and the appendix are unremarkable. Vascular/Lymphatic: The abdominal aorta is normal in caliber. No focal aneurysmal dilatation is seen. No adenopathy is noted. Reproductive: The uterus is normal in size. No adnexal lesion seen with probable tiny follicles on the right. No free fluid is seen within the pelvis. Other: No abdominal wall hernia is seen. Musculoskeletal: There degenerative changes involving both SI joints with sclerosis and irregularity. The lumbar vertebrae are in normal alignment with intervertebral disc spaces appear grossly normal. However considerable degenerative change does involve the facet joints of the mid to lower lumbar spine diffusely. IMPRESSION: 1. Although the colon is completely decompressed, the entire colonic mucosa appears somewhat edematous, suspicious for diffuse colitis. Correlate clinically. No abscess is seen. 2. Degenerative changes throughout the facet joints of the lower lumbar spine and within the SI joints. Electronically Signed   By: Ivar Drape M.D.   On: 10/12/2017 13:25    Procedures Procedures (including critical care time)  Medications Ordered in ED Medications  sodium chloride 0.9 % bolus 1,000 mL (0 mLs Intravenous Stopped 10/12/17 1112)    Followed by  0.9 %  sodium chloride infusion (1,000 mLs Intravenous New Bag/Given 10/12/17 1244)  sodium chloride 0.9 % bolus 1,000 mL (0 mLs Intravenous Hold 10/12/17 1112)  ondansetron (ZOFRAN) injection 4 mg (4 mg Intravenous Given 10/12/17 1037)  famotidine (PEPCID) IVPB 20 mg premix (0 mg Intravenous Stopped 10/12/17 1111)  HYDROmorphone (DILAUDID) injection 1 mg (1 mg Intravenous Given 10/12/17 1037)  iohexol (OMNIPAQUE) 300 MG/ML solution 30 mL (30 mLs Oral Contrast Given 10/12/17 0947)  iohexol (OMNIPAQUE) 300 MG/ML solution 75 mL (75 mLs  Intravenous Contrast Given 10/12/17 1251)     Initial Impression / Assessment and Plan / ED Course  I have reviewed the triage vital signs and the nursing notes.  Pertinent labs & imaging results that were available during my care of the patient were reviewed by me and considered in my medical decision making (see chart for details).  Clinical Course as of Oct 13 1355  Tue Oct 12, 2017  1157 Just informed by nursing staff that the patient's IV blew after getting  her medications and 100 mils of ordered fluids.  Advised that no one is been able to obtain IV and CT has not yet been done.  Will evaluate for ultrasound IV by myself.   [MP]    Clinical Course User Index [MP] Charlesetta Shanks, MD  Recheck: Patient reports feeling much improved in terms of nausea and pain after treatment with Pepcid, Dilaudid and Zofran.  Fluids are infusing.  There was delayed due to difficulty with getting second IV.  Consult: Reviewed with PA for lower GI.  Suggests patient should be admitted for ongoing hydration and management of active ulcerative colitis.  They will do consultation for management.  Final Clinical Impressions(s) / ED Diagnoses   Final diagnoses:  Other ulcerative colitis with rectal bleeding ([place])  Dehydration  Intractable vomiting with nausea, unspecified vomiting type  Patient presents as outlined above.  She has known history of colitis.  She has had 2 weeks of bloody diarrhea.  While in the emergency department she continues to have frequent, small amounts of bloody diarrhea.  Clinically dehydrated with tachycardia and generalized weakness.  She is nontoxic.  CT does not show any abscess or perforation.  Also patient has been made with gastroenterology.  Recommendation is for admission to hospitalist service and they will do consultation for further management.  ED Discharge Orders    None       Charlesetta Shanks, MD 10/12/17 1359

## 2017-10-12 NOTE — Telephone Encounter (Signed)
Patient is currently in the ED for rectal bleeding.

## 2017-10-12 NOTE — ED Notes (Signed)
Unable to initiate an IV, IV team requested

## 2017-10-12 NOTE — Consult Note (Signed)
Consultation  Referring Provider: Dr. Johnney Killian      Primary Care Physician:  Patient, No Pcp Per Primary Gastroenterologist: Dr. Loletha Carrow        Reason for Consultation: Rectal Bleeding           HPI:   [name] is a 62 y.o. female with a past medical history as listed below including Ulcerative Colitis, who presents to the ER today with a complaint of rectal bleeding.    Today, explains that over the past 2 weeks she has had a "flare" of her colitis.  Describes having at least 8 loose bowel movements per day, about 50% of these are bloody associated with lower abdominal pain, described as "stabbing" and constant nausea with episodes of vomiting when she tries to eat anything.  Prior to this patient's normal, about 3 weeks ago, was 2-3 formed bowel movements per day with no abdominal pain.  Has been using Dicyclomine which helps with the cramping and Zofran which does help with the nausea.  Associated symptoms include a weight loss of around 20 pounds in the past 3 weeks per the patient.    Past medical history significant for another episode of colitis back in December 2017 for which she was hospitalized and on Prednisone.  Explains that Prednisone made her heart rate jump into the 160s and made her jittery and anxious.  They had to taper off of this quickly due to the side effects.  Prior to this her last flare was in 2013.    Denies fever or chills, new rashes or new joint pains.  ER work-up: CT abdomen pelvis with contrast showing that the entire colonic mucosa appeared somewhat edematous, suspicious for diffuse colitis, degenerative changes also throughout the facet joints of the lower lumbar spine and within the SI joints  GI History: 10/08/2017 office visit Dr. Loletha Carrow: At that time establishing care for history of ulcerative colitis, diarrhea and nausea and vomiting, apparently recent colonoscopy in December 2017 with "some inflammation", at that time lab including CBC, BMP, ESR and stool  for C. difficile, prescribed Zofran and dicyclomine as well as hydrocortisone suppositories; C. difficile returned negative and Dr. Loletha Carrow thought this was a true flare of her ulcerative colitis, had still not received records from her GI physician, he recommended she have a colonoscopy with him by the next week and she was told to use over-the-counter preparations H suppositories with hydrocortisone as her insurance did not cover Anusol-ESR returned elevated at 85, BMP with a creatinine of 1.55, CBC with elevated platelets at 536  Past Medical History:  Diagnosis Date  . Arthritis   . C. difficile diarrhea   . Heart attack ([place]) 10/24/2008  . HLD (hyperlipidemia)   . HTN (hypertension)   . UC (ulcerative colitis) [facility])     Past Surgical History:  Procedure Laterality Date  . CESAREAN SECTION     x 3  . TONSILLECTOMY     age 61  . TOTAL KNEE ARTHROPLASTY Bilateral 10/22/2008    Family History  Problem Relation Age of Onset  . Stroke Mother   . Congestive Heart Failure Father   . Obesity Sister   . Hypertension Sister   . Diabetes Brother   . Thyroid disease Daughter     Social History   Tobacco Use  . Smoking status: Never Smoker  . Smokeless tobacco: Never Used  Substance Use Topics  . Alcohol use: Not Currently  . Drug use: Never  Prior to Admission medications   Medication Sig Start Date End Date Taking? Authorizing Provider  acetaminophen (TYLENOL) 500 MG tablet Take 500 mg by mouth every 6 (six) hours as needed (For arthritis pain.).    Yes [provider]  Calcium Carb-Cholecalciferol (CALCIUM-VITAMIN D) 600-400 MG-UNIT TABS Take 1 tablet by mouth daily.   Yes [provider]  dicyclomine (BENTYL) 10 MG capsule Take 1 capsule (10 mg total) by mouth every 8 (eight) hours as needed for spasms. 10/08/17  Yes Danis, Kirke Corin, MD  metoprolol succinate (TOPROL-XL) 50 MG 24 hr tablet Take 50 mg by mouth daily. Take with or immediately following a meal.    Yes [provider]  Multiple Vitamin (MULTIVITAMIN) tablet Take 1 tablet by mouth daily.   Yes [provider]  ondansetron (ZOFRAN) 4 MG tablet Take 1 tablet (4 mg total) by mouth every 8 (eight) hours as needed for nausea or vomiting. 10/08/17  Yes Danis, Kirke Corin, MD  hydrocortisone (ANUSOL-HC) 25 MG suppository Place 1 suppository (25 mg total) rectally 2 (two) times daily. Patient not taking: Reported on 10/12/2017 10/08/17   Doran Stabler, MD    Current Facility-Administered Medications  Medication Dose Route Frequency Provider Last Rate Last Dose  . 0.9 %  sodium chloride infusion  1,000 mL Intravenous Continuous Charlesetta Shanks, MD 125 mL/hr at 10/12/17 1244 1,000 mL at 10/12/17 1244  . sodium chloride 0.9 % bolus 1,000 mL  1,000 mL Intravenous Once Charlesetta Shanks, MD   Stopped at 10/12/17 1112   Current Outpatient Medications  Medication Sig Dispense Refill  . acetaminophen (TYLENOL) 500 MG tablet Take 500 mg by mouth every 6 (six) hours as needed (For arthritis pain.).     Marland Kitchen Calcium Carb-Cholecalciferol (CALCIUM-VITAMIN D) 600-400 MG-UNIT TABS Take 1 tablet by mouth daily.    Marland Kitchen dicyclomine (BENTYL) 10 MG capsule Take 1 capsule (10 mg total) by mouth every 8 (eight) hours as needed for spasms. 90 capsule 0  . metoprolol succinate (TOPROL-XL) 50 MG 24 hr tablet Take 50 mg by mouth daily. Take with or immediately following a meal.    . Multiple Vitamin (MULTIVITAMIN) tablet Take 1 tablet by mouth daily.    . ondansetron (ZOFRAN) 4 MG tablet Take 1 tablet (4 mg total) by mouth every 8 (eight) hours as needed for nausea or vomiting. 30 tablet 1  . hydrocortisone (ANUSOL-HC) 25 MG suppository Place 1 suppository (25 mg total) rectally 2 (two) times daily. (Patient not taking: Reported on 10/12/2017) 14 suppository 0    Allergies as of 10/12/2017 - Review Complete 10/12/2017  Allergen Reaction Noted  . Prednisone Other (See Comments) 10/08/2017     Review of  Systems:    Constitutional: No fever or chills Skin: No rash  Cardiovascular: No chest pain   Respiratory: No SOB  Gastrointestinal: See HPI and otherwise negative Genitourinary: No dysuria  Neurological: No headache, dizziness or syncope Musculoskeletal: No new muscle or joint pain Hematologic: No bruising Psychiatric: No history of depression or anxiety    Physical Exam:  Vital signs in last 24 hours: Temp:  [99.8 F (37.7 C)] 99.8 F (37.7 C) (08/20 0831) Pulse Rate:  [42-121] 107 (08/20 1340) Resp:  [16] 16 (08/20 1243) BP: (102-136)/(72-92) 134/92 (08/20 1340) SpO2:  [91 %-100 %] 98 % (08/20 1340) Weight:  [97.5 kg] 97.5 kg (08/20 0833)   General:   Pleasant overweight AA female appears to be in NAD, Well developed, Well nourished, alert  and cooperative Head:  Normocephalic and atraumatic. Eyes:   PEERL, EOMI. No icterus. Conjunctiva pink. Ears:  Normal auditory acuity. Neck:  Supple Throat: Oral cavity and pharynx without inflammation, swelling or lesion. Lungs: Respirations even and unlabored. Lungs clear to auscultation bilaterally.   No wheezes, crackles, or rhonchi.  Heart: Normal S1, S2. No MRG. Regular rate and rhythm. No peripheral edema, cyanosis or pallor.  Abdomen:  Soft, nondistended, nontender (did just receive Dilaudid prior to exam). No rebound or guarding. Normal bowel sounds. No appreciable masses or hepatomegaly. Rectal:  Not performed.  Msk:  Symmetrical without gross deformities.  Extremities:  Without edema, no deformity or joint abnormality.  Neurologic:  Alert and  oriented x4;  grossly normal neurologically. Skin:   Dry and intact without significant lesions or rashes. Psychiatric: Demonstrates good judgement and reason without abnormal affect or behaviors.   LAB RESULTS: Recent Labs    10/12/17 0900  WBC 12.9*  HGB 14.4  HCT 42.1  PLT 555*   BMET Recent Labs    10/12/17 0900  NA 135  K 4.1  CL 93*  CO2 29  GLUCOSE 119*  BUN 15    CREATININE 1.61*  CALCIUM 9.6   LFT Recent Labs    10/12/17 0900  PROT 6.6  ALBUMIN 2.7*  AST 16  ALT 14  ALKPHOS 100  BILITOT 0.7   PT/INR Recent Labs    10/12/17 0900  LABPROT 12.9  INR 0.98    STUDIES: Ct Abdomen Pelvis W Contrast  Result Date: 10/12/2017 CLINICAL DATA:  Nausea and vomiting for 2 weeks, history of ulcerative colitis, patient could not drink oral contrast due to vomiting EXAM: CT ABDOMEN AND PELVIS WITH CONTRAST TECHNIQUE: Multidetector CT imaging of the abdomen and pelvis was performed using the standard protocol following bolus administration of intravenous contrast. CONTRAST:  50m OMNIPAQUE IOHEXOL 300 MG/ML  SOLN COMPARISON:  None. FINDINGS: Lower chest: The lung bases are clear. The heart is within normal limits in size. No pericardial effusion is seen. Hepatobiliary: Tiny subcentimeter low-attenuation structures in the liver most consistent with a benign process such as cysts. No focal hepatic abnormality is seen. The gallbladder is slightly distended but no calcified gallstones are seen. Pancreas: The pancreas is normal in size and the pancreatic duct is not dilated. Spleen: The spleen is unremarkable. Adrenals/Urinary Tract: The adrenal glands appear normal. The kidneys enhance with no mass or calculus. On delayed images, the pelvocaliceal systems are unremarkable. Ureters appear normal in caliber. The urinary bladder is not well distended but no abnormality is noted. Stomach/Bowel: The stomach is completely decompressed and cannot be well assessed. No abnormality of small bowel is seen. The colon however is completely decompressed and does appear to be somewhat edematous diffusely. Diffuse colitis is a definite consideration. No focal abscess is seen. The terminal ileum and the appendix are unremarkable. Vascular/Lymphatic: The abdominal aorta is normal in caliber. No focal aneurysmal dilatation is seen. No adenopathy is noted. Reproductive: The uterus is  normal in size. No adnexal lesion seen with probable tiny follicles on the right. No free fluid is seen within the pelvis. Other: No abdominal wall hernia is seen. Musculoskeletal: There degenerative changes involving both SI joints with sclerosis and irregularity. The lumbar vertebrae are in normal alignment with intervertebral disc spaces appear grossly normal. However considerable degenerative change does involve the facet joints of the mid to lower lumbar spine diffusely. IMPRESSION: 1. Although the colon is completely decompressed, the entire colonic mucosa appears somewhat  edematous, suspicious for diffuse colitis. Correlate clinically. No abscess is seen. 2. Degenerative changes throughout the facet joints of the lower lumbar spine and within the SI joints. Electronically Signed   By: Ivar Drape M.D.   On: 10/12/2017 13:25    Impression / Plan:   Impression: 1.  Hematochezia: with nausea, vomiting and abdominal pain, Cdiff negative, CT as above showing diffuse colitis, history of ulcerative colitis per patient that we do not have records; likely UC flare  Plan: 1.  Scheduled patient for colonoscopy tomorrow with Dr. Rush Landmark.  Did discuss risk, benefits, limitations and alternatives and the patient agrees to proceed. 2.  Patient will do a half of a movi prep tonight starting at 7 PM.  Ordered IV Reglan 20 mg to be given at time of starting prep 3.  Patient will be on clears today and n.p.o. after midnight 4.  We will hold off on starting steroids due to their side effects for the patient last time.  Did discuss that depending on severity of inflammation these may be required.  Or possibly Uceris 5.  Ordered fecal calprotectin and ESR today 6.  Patient was seen with Dr. Rush Landmark.  Further recommendations after procedure tomorrow.  Thank you for your kind consultation, we will continue to follow.  Lavone Nian Lemmon  10/12/2017, 2:04 PM

## 2017-10-12 NOTE — H&P (View-Only) (Signed)
Consultation  Referring Provider: Dr. Johnney Killian      Primary Care Physician:  Patient, No Pcp Per Primary Gastroenterologist: Dr. Loletha Carrow        Reason for Consultation: Rectal Bleeding           HPI:   [name] is a 62 y.o. female with a past medical history as listed below including Ulcerative Colitis, who presents to the ER today with a complaint of rectal bleeding.    Today, explains that over the past 2 weeks she has had a "flare" of her colitis.  Describes having at least 8 loose bowel movements per day, about 50% of these are bloody associated with lower abdominal pain, described as "stabbing" and constant nausea with episodes of vomiting when she tries to eat anything.  Prior to this patient's normal, about 3 weeks ago, was 2-3 formed bowel movements per day with no abdominal pain.  Has been using Dicyclomine which helps with the cramping and Zofran which does help with the nausea.  Associated symptoms include a weight loss of around 20 pounds in the past 3 weeks per the patient.    Past medical history significant for another episode of colitis back in December 2017 for which she was hospitalized and on Prednisone.  Explains that Prednisone made her heart rate jump into the 160s and made her jittery and anxious.  They had to taper off of this quickly due to the side effects.  Prior to this her last flare was in 2013.    Denies fever or chills, new rashes or new joint pains.  ER work-up: CT abdomen pelvis with contrast showing that the entire colonic mucosa appeared somewhat edematous, suspicious for diffuse colitis, degenerative changes also throughout the facet joints of the lower lumbar spine and within the SI joints  GI History: 10/08/2017 office visit Dr. Loletha Carrow: At that time establishing care for history of ulcerative colitis, diarrhea and nausea and vomiting, apparently recent colonoscopy in December 2017 with "some inflammation", at that time lab including CBC, BMP, ESR and stool  for C. difficile, prescribed Zofran and dicyclomine as well as hydrocortisone suppositories; C. difficile returned negative and Dr. Loletha Carrow thought this was a true flare of her ulcerative colitis, had still not received records from her GI physician, he recommended she have a colonoscopy with him by the next week and she was told to use over-the-counter preparations H suppositories with hydrocortisone as her insurance did not cover Anusol-ESR returned elevated at 85, BMP with a creatinine of 1.55, CBC with elevated platelets at 536  Past Medical History:  Diagnosis Date  . Arthritis   . C. difficile diarrhea   . Heart attack ([place]) 10/24/2008  . HLD (hyperlipidemia)   . HTN (hypertension)   . UC (ulcerative colitis) [facility])     Past Surgical History:  Procedure Laterality Date  . CESAREAN SECTION     x 3  . TONSILLECTOMY     age 70  . TOTAL KNEE ARTHROPLASTY Bilateral 10/22/2008    Family History  Problem Relation Age of Onset  . Stroke Mother   . Congestive Heart Failure Father   . Obesity Sister   . Hypertension Sister   . Diabetes Brother   . Thyroid disease Daughter     Social History   Tobacco Use  . Smoking status: Never Smoker  . Smokeless tobacco: Never Used  Substance Use Topics  . Alcohol use: Not Currently  . Drug use: Never  Prior to Admission medications   Medication Sig Start Date End Date Taking? Authorizing Provider  acetaminophen (TYLENOL) 500 MG tablet Take 500 mg by mouth every 6 (six) hours as needed (For arthritis pain.).    Yes [provider]  Calcium Carb-Cholecalciferol (CALCIUM-VITAMIN D) 600-400 MG-UNIT TABS Take 1 tablet by mouth daily.   Yes [provider]  dicyclomine (BENTYL) 10 MG capsule Take 1 capsule (10 mg total) by mouth every 8 (eight) hours as needed for spasms. 10/08/17  Yes Danis, Kirke Corin, MD  metoprolol succinate (TOPROL-XL) 50 MG 24 hr tablet Take 50 mg by mouth daily. Take with or immediately following a meal.    Yes [provider]  Multiple Vitamin (MULTIVITAMIN) tablet Take 1 tablet by mouth daily.   Yes [provider]  ondansetron (ZOFRAN) 4 MG tablet Take 1 tablet (4 mg total) by mouth every 8 (eight) hours as needed for nausea or vomiting. 10/08/17  Yes Danis, Kirke Corin, MD  hydrocortisone (ANUSOL-HC) 25 MG suppository Place 1 suppository (25 mg total) rectally 2 (two) times daily. Patient not taking: Reported on 10/12/2017 10/08/17   Doran Stabler, MD    Current Facility-Administered Medications  Medication Dose Route Frequency Provider Last Rate Last Dose  . 0.9 %  sodium chloride infusion  1,000 mL Intravenous Continuous Charlesetta Shanks, MD 125 mL/hr at 10/12/17 1244 1,000 mL at 10/12/17 1244  . sodium chloride 0.9 % bolus 1,000 mL  1,000 mL Intravenous Once Charlesetta Shanks, MD   Stopped at 10/12/17 1112   Current Outpatient Medications  Medication Sig Dispense Refill  . acetaminophen (TYLENOL) 500 MG tablet Take 500 mg by mouth every 6 (six) hours as needed (For arthritis pain.).     Marland Kitchen Calcium Carb-Cholecalciferol (CALCIUM-VITAMIN D) 600-400 MG-UNIT TABS Take 1 tablet by mouth daily.    Marland Kitchen dicyclomine (BENTYL) 10 MG capsule Take 1 capsule (10 mg total) by mouth every 8 (eight) hours as needed for spasms. 90 capsule 0  . metoprolol succinate (TOPROL-XL) 50 MG 24 hr tablet Take 50 mg by mouth daily. Take with or immediately following a meal.    . Multiple Vitamin (MULTIVITAMIN) tablet Take 1 tablet by mouth daily.    . ondansetron (ZOFRAN) 4 MG tablet Take 1 tablet (4 mg total) by mouth every 8 (eight) hours as needed for nausea or vomiting. 30 tablet 1  . hydrocortisone (ANUSOL-HC) 25 MG suppository Place 1 suppository (25 mg total) rectally 2 (two) times daily. (Patient not taking: Reported on 10/12/2017) 14 suppository 0    Allergies as of 10/12/2017 - Review Complete 10/12/2017  Allergen Reaction Noted  . Prednisone Other (See Comments) 10/08/2017     Review of  Systems:    Constitutional: No fever or chills Skin: No rash  Cardiovascular: No chest pain   Respiratory: No SOB  Gastrointestinal: See HPI and otherwise negative Genitourinary: No dysuria  Neurological: No headache, dizziness or syncope Musculoskeletal: No new muscle or joint pain Hematologic: No bruising Psychiatric: No history of depression or anxiety    Physical Exam:  Vital signs in last 24 hours: Temp:  [99.8 F (37.7 C)] 99.8 F (37.7 C) (08/20 0831) Pulse Rate:  [42-121] 107 (08/20 1340) Resp:  [16] 16 (08/20 1243) BP: (102-136)/(72-92) 134/92 (08/20 1340) SpO2:  [91 %-100 %] 98 % (08/20 1340) Weight:  [97.5 kg] 97.5 kg (08/20 0833)   General:   Pleasant overweight AA female appears to be in NAD, Well developed, Well nourished, alert  and cooperative Head:  Normocephalic and atraumatic. Eyes:   PEERL, EOMI. No icterus. Conjunctiva pink. Ears:  Normal auditory acuity. Neck:  Supple Throat: Oral cavity and pharynx without inflammation, swelling or lesion. Lungs: Respirations even and unlabored. Lungs clear to auscultation bilaterally.   No wheezes, crackles, or rhonchi.  Heart: Normal S1, S2. No MRG. Regular rate and rhythm. No peripheral edema, cyanosis or pallor.  Abdomen:  Soft, nondistended, nontender (did just receive Dilaudid prior to exam). No rebound or guarding. Normal bowel sounds. No appreciable masses or hepatomegaly. Rectal:  Not performed.  Msk:  Symmetrical without gross deformities.  Extremities:  Without edema, no deformity or joint abnormality.  Neurologic:  Alert and  oriented x4;  grossly normal neurologically. Skin:   Dry and intact without significant lesions or rashes. Psychiatric: Demonstrates good judgement and reason without abnormal affect or behaviors.   LAB RESULTS: Recent Labs    10/12/17 0900  WBC 12.9*  HGB 14.4  HCT 42.1  PLT 555*   BMET Recent Labs    10/12/17 0900  NA 135  K 4.1  CL 93*  CO2 29  GLUCOSE 119*  BUN 15    CREATININE 1.61*  CALCIUM 9.6   LFT Recent Labs    10/12/17 0900  PROT 6.6  ALBUMIN 2.7*  AST 16  ALT 14  ALKPHOS 100  BILITOT 0.7   PT/INR Recent Labs    10/12/17 0900  LABPROT 12.9  INR 0.98    STUDIES: Ct Abdomen Pelvis W Contrast  Result Date: 10/12/2017 CLINICAL DATA:  Nausea and vomiting for 2 weeks, history of ulcerative colitis, patient could not drink oral contrast due to vomiting EXAM: CT ABDOMEN AND PELVIS WITH CONTRAST TECHNIQUE: Multidetector CT imaging of the abdomen and pelvis was performed using the standard protocol following bolus administration of intravenous contrast. CONTRAST:  39m OMNIPAQUE IOHEXOL 300 MG/ML  SOLN COMPARISON:  None. FINDINGS: Lower chest: The lung bases are clear. The heart is within normal limits in size. No pericardial effusion is seen. Hepatobiliary: Tiny subcentimeter low-attenuation structures in the liver most consistent with a benign process such as cysts. No focal hepatic abnormality is seen. The gallbladder is slightly distended but no calcified gallstones are seen. Pancreas: The pancreas is normal in size and the pancreatic duct is not dilated. Spleen: The spleen is unremarkable. Adrenals/Urinary Tract: The adrenal glands appear normal. The kidneys enhance with no mass or calculus. On delayed images, the pelvocaliceal systems are unremarkable. Ureters appear normal in caliber. The urinary bladder is not well distended but no abnormality is noted. Stomach/Bowel: The stomach is completely decompressed and cannot be well assessed. No abnormality of small bowel is seen. The colon however is completely decompressed and does appear to be somewhat edematous diffusely. Diffuse colitis is a definite consideration. No focal abscess is seen. The terminal ileum and the appendix are unremarkable. Vascular/Lymphatic: The abdominal aorta is normal in caliber. No focal aneurysmal dilatation is seen. No adenopathy is noted. Reproductive: The uterus is  normal in size. No adnexal lesion seen with probable tiny follicles on the right. No free fluid is seen within the pelvis. Other: No abdominal wall hernia is seen. Musculoskeletal: There degenerative changes involving both SI joints with sclerosis and irregularity. The lumbar vertebrae are in normal alignment with intervertebral disc spaces appear grossly normal. However considerable degenerative change does involve the facet joints of the mid to lower lumbar spine diffusely. IMPRESSION: 1. Although the colon is completely decompressed, the entire colonic mucosa appears somewhat  edematous, suspicious for diffuse colitis. Correlate clinically. No abscess is seen. 2. Degenerative changes throughout the facet joints of the lower lumbar spine and within the SI joints. Electronically Signed   By: Ivar Drape M.D.   On: 10/12/2017 13:25    Impression / Plan:   Impression: 1.  Hematochezia: with nausea, vomiting and abdominal pain, Cdiff negative, CT as above showing diffuse colitis, history of ulcerative colitis per patient that we do not have records; likely UC flare  Plan: 1.  Scheduled patient for colonoscopy tomorrow with Dr. Rush Landmark.  Did discuss risk, benefits, limitations and alternatives and the patient agrees to proceed. 2.  Patient will do a half of a movi prep tonight starting at 7 PM.  Ordered IV Reglan 20 mg to be given at time of starting prep 3.  Patient will be on clears today and n.p.o. after midnight 4.  We will hold off on starting steroids due to their side effects for the patient last time.  Did discuss that depending on severity of inflammation these may be required.  Or possibly Uceris 5.  Ordered fecal calprotectin and ESR today 6.  Patient was seen with Dr. Rush Landmark.  Further recommendations after procedure tomorrow.  Thank you for your kind consultation, we will continue to follow.  Lavone Nian Maizee Reinhold  10/12/2017, 2:04 PM

## 2017-10-12 NOTE — ED Notes (Signed)
Pt to CT

## 2017-10-12 NOTE — ED Triage Notes (Signed)
Pt has history of ulcerative colitis last flare up was in december 2017. For the past two weeks patient has had bloody stools. Last one was 8/20 at 6 am  Medication given by her primary dr is PO and she is unable to keep anything down.

## 2017-10-12 NOTE — H&P (Signed)
History and Physical    [name] RFF:[id number] DOB: Aug 24, 1955 DOA: 10/12/2017  PCP: Patient, No Pcp Per   Patient coming from: Home  Chief Complaint: Bloody Diarrhea x 2 weeks, Nausea, Vomiting and Sharp Abdominal Pain   HPI: [name] is a 62 y.o. female with medical history significant of hypertension, hyperlipidemia no longer taking her statin, history of ulcerative colitis, history of MI after bilateral knee surgery likely attributed to blood loss, history of arthritis, and history of C. difficile seal diarrhea and other comorbidities who presents with a chief complaint of bloody diarrhea for last [redacted] weeks along associated persistent sharp abdominal pain along with nausea and vomiting.  Patient states that symptoms started 2 weeks ago and patient feels very similar to previous UC  flare. Started with bloody diarrhea and averageing 10-11 bowel movements and now dow to 8. Nausea and vomiting started a few days after the diarrhea. Had SOB. No CP and no Lightheadeness or dizziness.  Feels weak and Fatigued. Abominal Pain started same time as diarrhea and lower abdominal pain 7/10 and persistent. Sharp stabbing pain.  No current pain right now as she received IV Dilaudid.  Recently saw Dr. Loletha Carrow of GI who started her on prednisone suppositories however she did not pick these up yet.  Because of symptoms persisting she presented to the ED for further evaluation.  [facility] was called to admit this patient for a acute ulcerative colitis flare and gastroenterology was consulted for further evaluation and recommendations and are planning to take the patient for colonoscopy in a.m.  ED Course: Had basic blood work done including a C. difficile as well as a GI pathogen panel.  She also had a CT of the abdomen pelvis done along with a twelve-lead EKG. lactic acid level was normal  Review of Systems: As per HPI otherwise 10 point review of systems negative.   Past Medical History:  Diagnosis Date  .  Arthritis   . C. difficile diarrhea   . Heart attack ([place]) 10/24/2008  . HLD (hyperlipidemia)   . HTN (hypertension)   . UC (ulcerative colitis) ([place])   -MI in September 10th 2010 after Surgery and lost a lot of blood  Past Surgical History:  Procedure Laterality Date  . CESAREAN SECTION     x 3  . TONSILLECTOMY     age 9  . TOTAL KNEE ARTHROPLASTY Bilateral 10/22/2008   SOCIAL HISTORY   reports that she has never smoked. She has never used smokeless tobacco. She reports that she drank alcohol. She reports that she does not use drugs.  Allergies  Allergen Reactions  . Prednisone Other (See Comments)    Tachycardia, nervous, sweating , jitters   Family History  Problem Relation Age of Onset  . Stroke Mother   . Congestive Heart Failure Father   . Obesity Sister   . Hypertension Sister   . Diabetes Brother   . Thyroid disease Daughter   -Both Parents Deceased  Prior to Admission medications   Medication Sig Start Date End Date Taking? Authorizing Provider  acetaminophen (TYLENOL) 500 MG tablet Take 500 mg by mouth every 6 (six) hours as needed (For arthritis pain.).    Yes [provider]  Calcium Carb-Cholecalciferol (CALCIUM-VITAMIN D) 600-400 MG-UNIT TABS Take 1 tablet by mouth daily.   Yes [provider]  dicyclomine (BENTYL) 10 MG capsule Take 1 capsule (10 mg total) by mouth every 8 (eight) hours as needed for spasms. 10/08/17  Yes Nelida Meuse  III, MD  metoprolol succinate (TOPROL-XL) 50 MG 24 hr tablet Take 50 mg by mouth daily. Take with or immediately following a meal.   Yes [provider]  Multiple Vitamin (MULTIVITAMIN) tablet Take 1 tablet by mouth daily.   Yes [provider]  ondansetron (ZOFRAN) 4 MG tablet Take 1 tablet (4 mg total) by mouth every 8 (eight) hours as needed for nausea or vomiting. 10/08/17  Yes Danis, Kirke Corin, MD  hydrocortisone (ANUSOL-HC) 25 MG suppository Place 1 suppository (25 mg total) rectally 2  (two) times daily. Patient not taking: Reported on 10/12/2017 10/08/17   Doran Stabler, MD   Physical Exam: Vitals:   10/12/17 1420 10/12/17 1440 10/12/17 1500 10/12/17 1520  BP: 118/66 (!) 113/94 (!) 135/92 (!) 135/101  Pulse: 99 100 100 (!) 105  Resp:   16   Temp:      TempSrc:      SpO2: 96% 99% 96% 97%  Weight:      Height:       Constitutional: WN/WD obese AAF in NAD and appears calm and comfortable Eyes: Lids and conjunctivae normal, sclerae anicteric  ENMT: External Ears, Nose appear normal. Grossly normal hearing. Mucous membranes are slightly dry  Neck: Appears normal, supple, no cervical masses, normal ROM, no appreciable thyromegaly, no JVD Respiratory: Diminished to auscultation bilaterally, no wheezing, rales, rhonchi or crackles. Normal respiratory effort and patient is not tachypenic. No accessory muscle use.  Cardiovascular: Tachycardic Rate but Regular Rhythm, no murmurs / rubs / gallops. S1 and S2 auscultated. No extremity edema. 2 Abdomen: Soft, slightly tender, Distended due to body habitus. No masses palpated. No appreciable hepatosplenomegaly. Bowel sounds positive x4.  GU: Deferred. Musculoskeletal: No clubbing / cyanosis of digits/nails. No joint deformity upper and lower extremities. Good ROM, no contractures.  Skin: No rashes, lesions, ulcers on a limited skin evaluation. Has abdominal scar from C Section and Knee scars from arthroplasty. No induration; Warm and dry.  Neurologic: CN 2-12 grossly intact with no focal deficits. Sensation intact in all 4 Extremities, DTR normal. Strength 5/5 in all 4. Romberg sign cerebellar reflexes not assessed.  Psychiatric: Normal judgment and insight. Alert and oriented x 3. Normal mood and appropriate affect.   Labs on Admission: I have personally reviewed following labs and imaging studies  CBC: Recent Labs  Lab 10/08/17 1117 10/12/17 0900  WBC 10.1 12.9*  NEUTROABS 7.1 9.5  HGB 13.0 14.4  HCT 38.9 42.1  MCV  90.7 90.1  PLT 536.0* 350*   Basic Metabolic Panel: Recent Labs  Lab 10/08/17 1117 10/12/17 0900  NA 136 135  K 3.9 4.1  CL 98 93*  CO2 30 29  GLUCOSE 121* 119*  BUN 17 15  CREATININE 1.55* 1.61*  CALCIUM 9.7 9.6   GFR: Estimated Creatinine Clearance: 43.5 mL/min (A) (by C-G formula based on SCr of 1.61 mg/dL (H)). Liver Function Tests: Recent Labs  Lab 10/12/17 0900  AST 16  ALT 14  ALKPHOS 100  BILITOT 0.7  PROT 6.6  ALBUMIN 2.7*   Recent Labs  Lab 10/12/17 0900  LIPASE 22   No results for input(s): AMMONIA in the last 168 hours. Coagulation Profile: Recent Labs  Lab 10/12/17 0900  INR 0.98   Cardiac Enzymes: No results for input(s): CKTOTAL, CKMB, CKMBINDEX, TROPONINI in the last 168 hours. BNP (last 3 results) No results for input(s): PROBNP in the last 8760 hours. HbA1C: No results for input(s): HGBA1C in the last 72 hours.  CBG: No results for input(s): GLUCAP in the last 168 hours. Lipid Profile: No results for input(s): CHOL, HDL, LDLCALC, TRIG, CHOLHDL, LDLDIRECT in the last 72 hours. Thyroid Function Tests: No results for input(s): TSH, T4TOTAL, FREET4, T3FREE, THYROIDAB in the last 72 hours. Anemia Panel: No results for input(s): VITAMINB12, FOLATE, FERRITIN, TIBC, IRON, RETICCTPCT in the last 72 hours. Urine analysis: No results found for: COLORURINE, APPEARANCEUR, LABSPEC, PHURINE, GLUCOSEU, HGBUR, BILIRUBINUR, KETONESUR, PROTEINUR, UROBILINOGEN, NITRITE, LEUKOCYTESUR Sepsis Labs: !!!!!!!!!!!!!!!!!!!!!!!!!!!!!!!!!!!!!!!!!!!! @LABRCNTIP (procalcitonin:4,lacticidven:4) ) Recent Results (from the past 240 hour(s))  Gastrointestinal Panel by PCR , Stool     Status: None   Collection Time: 10/12/17  9:04 AM  Result Value Ref Range Status   Campylobacter species NOT DETECTED NOT DETECTED Final   Plesimonas shigelloides NOT DETECTED NOT DETECTED Final   Salmonella species NOT DETECTED NOT DETECTED Final   Yersinia enterocolitica NOT DETECTED NOT  DETECTED Final   Vibrio species NOT DETECTED NOT DETECTED Final   Vibrio cholerae NOT DETECTED NOT DETECTED Final   Enteroaggregative E coli (EAEC) NOT DETECTED NOT DETECTED Final   Enteropathogenic E coli (EPEC) NOT DETECTED NOT DETECTED Final   Enterotoxigenic E coli (ETEC) NOT DETECTED NOT DETECTED Final   Shiga like toxin producing E coli (STEC) NOT DETECTED NOT DETECTED Final   Shigella/Enteroinvasive E coli (EIEC) NOT DETECTED NOT DETECTED Final   Cryptosporidium NOT DETECTED NOT DETECTED Final   Cyclospora cayetanensis NOT DETECTED NOT DETECTED Final   Entamoeba histolytica NOT DETECTED NOT DETECTED Final   Giardia lamblia NOT DETECTED NOT DETECTED Final   Adenovirus F40/41 NOT DETECTED NOT DETECTED Final   Astrovirus NOT DETECTED NOT DETECTED Final   Norovirus GI/GII NOT DETECTED NOT DETECTED Final   Rotavirus A NOT DETECTED NOT DETECTED Final   Sapovirus (I, II, IV, and V) NOT DETECTED NOT DETECTED Final    Comment: Performed at [facility], [address] [place], [place] [address]  C difficile quick scan w PCR reflex     Status: None   Collection Time: 10/12/17  9:04 AM  Result Value Ref Range Status   C Diff antigen NEGATIVE NEGATIVE Final   C Diff toxin NEGATIVE NEGATIVE Final   C Diff interpretation No C. difficile detected.  Final    Comment: Performed at [facility] : [facility], [place] [address] [place], [place] [address]    Radiological Exams on Admission: Ct Abdomen Pelvis W Contrast  Result Date: 10/12/2017 CLINICAL DATA:  Nausea and vomiting for 2 weeks, history of ulcerative colitis, patient could not drink oral contrast due to vomiting EXAM: CT ABDOMEN AND PELVIS WITH CONTRAST TECHNIQUE: Multidetector CT imaging of the abdomen and pelvis was performed using the standard protocol following bolus administration of intravenous contrast. CONTRAST:  45mL OMNIPAQUE IOHEXOL 300 MG/ML  SOLN COMPARISON:  None. FINDINGS: Lower chest: The lung bases are  clear. The heart is within normal limits in size. No pericardial effusion is seen. Hepatobiliary: Tiny subcentimeter low-attenuation structures in the liver most consistent with a benign process such as cysts. No focal hepatic abnormality is seen. The gallbladder is slightly distended but no calcified gallstones are seen. Pancreas: The pancreas is normal in size and the pancreatic duct is not dilated. Spleen: The spleen is unremarkable. Adrenals/Urinary Tract: The adrenal glands appear normal. The kidneys enhance with no mass or calculus. On delayed images, the pelvocaliceal systems are unremarkable. Ureters appear normal in caliber. The urinary bladder is not well distended but no abnormality is noted. Stomach/Bowel: The stomach is  completely decompressed and cannot be well assessed. No abnormality of small bowel is seen. The colon however is completely decompressed and does appear to be somewhat edematous diffusely. Diffuse colitis is a definite consideration. No focal abscess is seen. The terminal ileum and the appendix are unremarkable. Vascular/Lymphatic: The abdominal aorta is normal in caliber. No focal aneurysmal dilatation is seen. No adenopathy is noted. Reproductive: The uterus is normal in size. No adnexal lesion seen with probable tiny follicles on the right. No free fluid is seen within the pelvis. Other: No abdominal wall hernia is seen. Musculoskeletal: There degenerative changes involving both SI joints with sclerosis and irregularity. The lumbar vertebrae are in normal alignment with intervertebral disc spaces appear grossly normal. However considerable degenerative change does involve the facet joints of the mid to lower lumbar spine diffusely. IMPRESSION: 1. Although the colon is completely decompressed, the entire colonic mucosa appears somewhat edematous, suspicious for diffuse colitis. Correlate clinically. No abscess is seen. 2. Degenerative changes throughout the facet joints of the lower  lumbar spine and within the SI joints. Electronically Signed   By: Ivar Drape M.D.   On: 10/12/2017 13:25   EKG: Independently reviewed.  Shows sinus tachycardia rate 109 with no ST elevation on my interpretation patient did have some LVH though.  Assessment/Plan Active Problems:   Ulcerative colitis, acute (HCC)   History of myocardial infarction   AKI (acute kidney injury) (HCC)   Hyperglycemia   Hyperlipidemia   Thrombocytosis (HCC)   Leukocytosis  Bloody diarrhea for the last 2 weeks indicative of a ulcerative colitis flare -As an inpatient -Symptomatic UC and has had nausea, vomiting, abdominal pain and bloody diarrhea -FOBT was positive- -C. difficile and GI pathogen panel negative -CT abdomen and pelvis done and showed the colon is completely decompressed, the entire colonic  mucosa appears somewhat edematous, suspicious for diffuse colitis. No abscess is seen. There were Degenerative changes throughout the facet joints of the lowerlumbar spine and within the SI joints. -Saw [facility] Dr. Loletha Carrow who had prescribed her Anusol -C/w IVFluids and Symptom Control  -Patient defers Steroids at this time -Colonoscopy scheduled for tomorrow   Hx of with MI (Assume it was Type 2 NSTEMI) -In the Setting of Prior Blood Loss for Bilateral Knee Arthroplasty -C/w Metoprolol Succinate 50 mg po qHS -Will not monitor on telemetry as patient is hemodynamically stable  AKI -From blood Loss and Dehydration -Continue with IV fluid hydration with normal saline -UN/creatinine on admission was 15/1.61 -Avoid nephrotoxic medications possible -Repeat CMP in the a.m.  Hyperglycemia -Likely reactive but will rule out diabetes and check hemoglobin A1c -If blood sugars are persistently elevated will place on sensitive NovoLog sliding scale  Hyperlipidemia -No longer taking pravastatin -Check lipid panel in a.m.  Thrombocytosis  -Likely reactive to UC flare -Platelet count on admission was  555 -Continue to monitor and trend and repeat CBC in a.m.  Leukocytosis  -The setting of UC flare -WBC went from 10.1 is now 12.9 -Likely in the setting of dehydration, nausea, vomiting and abdominal pain from UC flare -Continue to monitor for signs and symptoms of infection -Repeat CBC in a.m.  DVT prophylaxis: SCDs Code Status: FULL CODE Family Communication: Discussed with aunt who was present at bedside Disposition Plan: Anticipate discharge home when medically stable and cleared by GI Consults called: Gastroenterology  Admission status: Inpatient MedSurg  Severity of Illness: The appropriate patient status for this patient is INPATIENT. Inpatient status is judged to be reasonable and  necessary in order to provide the required intensity of service to ensure the patient's safety. The patient's presenting symptoms, physical exam findings, and initial radiographic and laboratory data in the context of their chronic comorbidities is felt to place them at high risk for further clinical deterioration. Furthermore, it is not anticipated that the patient will be medically stable for discharge from the hospital within 2 midnights of admission. The following factors support the patient status of inpatient.   " The patient's presenting symptoms include nausea, vomiting, abdominal pain and bloody diarrhea for 2 weeks. " The worrisome physical exam findings include mild abdominal discomfort. " The initial radiographic and laboratory data are worrisome because of suspected UC flare and AKI. " The chronic co-morbidities include hypertension, hyperlipidemia, ulcerative colitis  * I certify that at the point of admission it is my clinical judgment that the patient will require inpatient hospital care spanning beyond 2 midnights from the point of admission due to high intensity of service, high risk for further deterioration and high frequency of surveillance required.Kerney Elbe, D.O. [facility] PAGER is on [place]  If 7PM-7AM, please contact night-coverage [email or web address] Password [facility]  10/12/2017, 3:42 PM

## 2017-10-12 NOTE — ED Notes (Signed)
ED TO INPATIENT HANDOFF REPORT  Name/Age/Gender [name] 62 y.o. female  Code Status Advance Directive Documentation     Most Recent Value  Type of Advance Directive  Living will [has one for state of [place]]  Pre-existing out of facility DNR order (yellow form or pink MOST form)  -  "MOST" Form in Place?  -      Home/SNF/Other Home  Chief Complaint Bloody Diarrhea; Unable to Eat  Level of Care/Admitting Diagnosis ED Disposition    ED Disposition Condition [facility] Area: [facility] [[id number]]  Level of Care: Med-Surg [16]  Diagnosis: Ulcerative colitis [facility]) [[id number]]  Admitting Physician: Raiford Noble LATIF [[id number]]  Attending Physician: Raiford Noble LATIF [[id number]]  Estimated length of stay: past midnight tomorrow  Certification:: I certify this patient will need inpatient services for at least 2 midnights  PT Class (Do Not Modify): Inpatient [101]  PT Acc Code (Do Not Modify): Private [1]       Medical History Past Medical History:  Diagnosis Date  . Arthritis   . C. difficile diarrhea   . Heart attack ([place]) 10/24/2008  . HLD (hyperlipidemia)   . HTN (hypertension)   . UC (ulcerative colitis) (HCC)     Allergies Allergies  Allergen Reactions  . Prednisone Other (See Comments)    Tachycardia, nervous, sweating , jitters    IV Location/Drains/Wounds Patient Lines/Drains/Airways Status   Active Line/Drains/Airways    Name:   Placement date:   Placement time:   Site:   Days:   Peripheral IV 10/12/17 Left Antecubital   10/12/17    1212    Antecubital   less than 1          Labs/Imaging Results for orders placed or performed during the hospital encounter of 10/12/17 (from the past 48 hour(s))  Comprehensive metabolic panel     Status: Abnormal   Collection Time: 10/12/17  9:00 AM  Result Value Ref Range   Sodium 135 135 - 145 mmol/L   Potassium 4.1 3.5 - 5.1 mmol/L   Chloride 93 (L) 98 - 111 mmol/L   CO2  29 22 - 32 mmol/L   Glucose, Bld 119 (H) 70 - 99 mg/dL   BUN 15 8 - 23 mg/dL   Creatinine, Ser 1.61 (H) 0.44 - 1.00 mg/dL   Calcium 9.6 8.9 - 10.3 mg/dL   Total Protein 6.6 6.5 - 8.1 g/dL   Albumin 2.7 (L) 3.5 - 5.0 g/dL   AST 16 15 - 41 U/L   ALT 14 0 - 44 U/L   Alkaline Phosphatase 100 38 - 126 U/L   Total Bilirubin 0.7 0.3 - 1.2 mg/dL   GFR calc non Af Amer 33 (L) >60 mL/min   GFR calc Af Amer 39 (L) >60 mL/min    Comment: (NOTE) The eGFR has been calculated using the CKD EPI equation. This calculation has not been validated in all clinical situations. eGFR's persistently <60 mL/min signify possible Chronic Kidney Disease.    Anion gap 13 5 - 15    Comment: Performed at [facility], [place] [address], [place], [place] [address]  Lipase, blood     Status: None   Collection Time: 10/12/17  9:00 AM  Result Value Ref Range   Lipase 22 11 - 51 U/L    Comment: Performed at [facility], [place] [address] [place], [place] [address]  Protime-INR     Status: None   Collection  Time: 10/12/17  9:00 AM  Result Value Ref Range   Prothrombin Time 12.9 11.4 - 15.2 seconds   INR 0.98     Comment: Performed at [facility], [place] [address] [place], [place] [address]  CBC with Differential/Platelet     Status: Abnormal   Collection Time: 10/12/17  9:00 AM  Result Value Ref Range   WBC 12.9 (H) 4.0 - 10.5 K/uL    Comment: WHITE COUNT CONFIRMED ON SMEAR   RBC 4.67 3.87 - 5.11 MIL/uL   Hemoglobin 14.4 12.0 - 15.0 g/dL   HCT 42.1 36.0 - 46.0 %   MCV 90.1 78.0 - 100.0 fL   MCH 30.8 26.0 - 34.0 pg   MCHC 34.2 30.0 - 36.0 g/dL   RDW 13.8 11.5 - 15.5 %   Platelets 555 (H) 150 - 400 K/uL    Comment: SPECIMEN CHECKED FOR CLOTS REPEATED TO VERIFY PLATELET COUNT CONFIRMED BY SMEAR    Neutrophils Relative % 73 %   Neutro Abs 9.5 1.7 - 7.7 K/uL   Lymphocytes Relative 17 %   Lymphs Abs 2.2 0.7 - 4.0 K/uL   Monocytes Relative 9 %   Monocytes  Absolute 1.2 0.1 - 1.0 K/uL   Eosinophils Relative 1 %   Eosinophils Absolute 0.1 0.0 - 0.7 K/uL   Basophils Relative 0 %   Basophils Absolute 0.0 0.0 - 0.1 K/uL    Comment: Performed at [facility], [place] [address], [place], [place] [address]  Gastrointestinal Panel by PCR , Stool     Status: None   Collection Time: 10/12/17  9:04 AM  Result Value Ref Range   Campylobacter species NOT DETECTED NOT DETECTED   Plesimonas shigelloides NOT DETECTED NOT DETECTED   Salmonella species NOT DETECTED NOT DETECTED   Yersinia enterocolitica NOT DETECTED NOT DETECTED   Vibrio species NOT DETECTED NOT DETECTED   Vibrio cholerae NOT DETECTED NOT DETECTED   Enteroaggregative E coli (EAEC) NOT DETECTED NOT DETECTED   Enteropathogenic E coli (EPEC) NOT DETECTED NOT DETECTED   Enterotoxigenic E coli (ETEC) NOT DETECTED NOT DETECTED   Shiga like toxin producing E coli (STEC) NOT DETECTED NOT DETECTED   Shigella/Enteroinvasive E coli (EIEC) NOT DETECTED NOT DETECTED   Cryptosporidium NOT DETECTED NOT DETECTED   Cyclospora cayetanensis NOT DETECTED NOT DETECTED   Entamoeba histolytica NOT DETECTED NOT DETECTED   Giardia lamblia NOT DETECTED NOT DETECTED   Adenovirus F40/41 NOT DETECTED NOT DETECTED   Astrovirus NOT DETECTED NOT DETECTED   Norovirus GI/GII NOT DETECTED NOT DETECTED   Rotavirus A NOT DETECTED NOT DETECTED   Sapovirus (I, II, IV, and V) NOT DETECTED NOT DETECTED    Comment: Performed at [facility], [place] [place], [place] [address]  Occult blood card to lab, stool     Status: Abnormal   Collection Time: 10/12/17  9:04 AM  Result Value Ref Range   Fecal Occult Bld POSITIVE (A) NEGATIVE    Comment: Performed at [facility], [place] [address], [place], [place] [address]  C difficile quick scan w PCR reflex     Status: None   Collection Time: 10/12/17  9:04 AM  Result Value Ref Range   C Diff antigen NEGATIVE NEGATIVE   C Diff toxin  NEGATIVE NEGATIVE   C Diff interpretation No C. difficile detected.     Comment: Performed at [facility], [place] [address] [place],  [address]  Type and screen [facility]  [place]     Status: None   Collection Time: 10/12/17  9:22 AM  Result Value Ref Range   ABO/RH(D) O POS    Antibody Screen NEG    Sample Expiration      10/15/2017 Performed at [facility], [place] [address], [place], [place] [address]   ABO/Rh     Status: None   Collection Time: 10/12/17 10:28 AM  Result Value Ref Range   ABO/RH(D)      O POS Performed at [facility], [place] [address], [place], [place] [address]   I-Stat CG4 Lactic Acid, ED     Status: None   Collection Time: 10/12/17 10:33 AM  Result Value Ref Range   Lactic Acid, Venous 1.82 0.5 - 1.9 mmol/L   Ct Abdomen Pelvis W Contrast  Result Date: 10/12/2017 CLINICAL DATA:  Nausea and vomiting for 2 weeks, history of ulcerative colitis, patient could not drink oral contrast due to vomiting EXAM: CT ABDOMEN AND PELVIS WITH CONTRAST TECHNIQUE: Multidetector CT imaging of the abdomen and pelvis was performed using the standard protocol following bolus administration of intravenous contrast. CONTRAST:  23m OMNIPAQUE IOHEXOL 300 MG/ML  SOLN COMPARISON:  None. FINDINGS: Lower chest: The lung bases are clear. The heart is within normal limits in size. No pericardial effusion is seen. Hepatobiliary: Tiny subcentimeter low-attenuation structures in the liver most consistent with a benign process such as cysts. No focal hepatic abnormality is seen. The gallbladder is slightly distended but no calcified gallstones are seen. Pancreas: The pancreas is normal in size and the pancreatic duct is not dilated. Spleen: The spleen is unremarkable. Adrenals/Urinary Tract: The adrenal glands appear normal. The kidneys enhance with no mass or calculus. On delayed images, the pelvocaliceal systems are unremarkable.  Ureters appear normal in caliber. The urinary bladder is not well distended but no abnormality is noted. Stomach/Bowel: The stomach is completely decompressed and cannot be well assessed. No abnormality of small bowel is seen. The colon however is completely decompressed and does appear to be somewhat edematous diffusely. Diffuse colitis is a definite consideration. No focal abscess is seen. The terminal ileum and the appendix are unremarkable. Vascular/Lymphatic: The abdominal aorta is normal in caliber. No focal aneurysmal dilatation is seen. No adenopathy is noted. Reproductive: The uterus is normal in size. No adnexal lesion seen with probable tiny follicles on the right. No free fluid is seen within the pelvis. Other: No abdominal wall hernia is seen. Musculoskeletal: There degenerative changes involving both SI joints with sclerosis and irregularity. The lumbar vertebrae are in normal alignment with intervertebral disc spaces appear grossly normal. However considerable degenerative change does involve the facet joints of the mid to lower lumbar spine diffusely. IMPRESSION: 1. Although the colon is completely decompressed, the entire colonic mucosa appears somewhat edematous, suspicious for diffuse colitis. Correlate clinically. No abscess is seen. 2. Degenerative changes throughout the facet joints of the lower lumbar spine and within the SI joints. Electronically Signed   By: PIvar DrapeM.D.   On: 10/12/2017 13:25    Pending Labs Unresulted Labs (From admission, onward)    Start     Ordered   10/13/17 0500  CBC with Differential/Platelet  Tomorrow morning,   R     10/12/17 1545   10/13/17 0500  Comprehensive metabolic panel  Tomorrow morning,   R     10/12/17 1545   10/13/17 0500  Magnesium  Tomorrow morning,   R  10/12/17 1545   10/13/17 0500  Phosphorus  Tomorrow morning,   R     10/12/17 1545   10/12/17 1457  Sedimentation rate  Once,   R     10/12/17 1456   10/12/17 1457  Calprotectin,  Fecal  Once,   R     10/12/17 1456   10/12/17 0848  CBC with Differential  Once,   STAT     10/12/17 0848   10/12/17 0848  Urinalysis, Routine w reflex microscopic  STAT,   STAT     10/12/17 0848   Signed and Held  HIV antibody (Routine Testing)  Once,   R     Signed and Held   Signed and Held  Magnesium  Add-on,   R     Signed and Held   Signed and Held  Phosphorus  Add-on,   R     Signed and Held   Signed and Held  TSH  Add-on,   R     Signed and Held   Signed and Held  Hemoglobin A1c  Add-on,   R     Signed and Held   Signed and Held  Comprehensive metabolic panel  Tomorrow morning,   R     Signed and Held   Signed and Held  Lipid panel  Tomorrow morning,   R     Signed and Held   Signed and Held  CBC with Differential/Platelet  Tomorrow morning,   R     Signed and Held          Vitals/Pain Today's Vitals   10/12/17 1440 10/12/17 1500 10/12/17 1520 10/12/17 1540  BP: (!) 113/94 (!) 135/92 (!) 135/101 (!) 137/125  Pulse: 100 100 (!) 105 (!) 125  Resp:  16  17  Temp:      TempSrc:      SpO2: 99% 96% 97% 97%  Weight:      Height:      PainSc:        Isolation Precautions No active isolations  Medications Medications  sodium chloride 0.9 % bolus 1,000 mL (0 mLs Intravenous Stopped 10/12/17 1112)    Followed by  0.9 %  sodium chloride infusion (1,000 mLs Intravenous New Bag/Given 10/12/17 1244)  sodium chloride 0.9 % bolus 1,000 mL (0 mLs Intravenous Hold 10/12/17 1112)  peg 3350 powder (MOVIPREP) kit 100 g (has no administration in time range)  metoCLOPramide (REGLAN) injection 10 mg (has no administration in time range)  ondansetron (ZOFRAN) injection 4 mg (4 mg Intravenous Given 10/12/17 1037)  famotidine (PEPCID) IVPB 20 mg premix (0 mg Intravenous Stopped 10/12/17 1111)  HYDROmorphone (DILAUDID) injection 1 mg (1 mg Intravenous Given 10/12/17 1037)  iohexol (OMNIPAQUE) 300 MG/ML solution 30 mL (30 mLs Oral Contrast Given 10/12/17 0947)  iohexol (OMNIPAQUE) 300  MG/ML solution 75 mL (75 mLs Intravenous Contrast Given 10/12/17 1251)    Mobility walks

## 2017-10-12 NOTE — Telephone Encounter (Signed)
No C diff infection. Therefore, seems to be a flare of her colitis.  Still have not received any records from prior Gi doc in [place].  Fortunately, she is not anemic.  She needs a colonoscopy with me by next week.  I could even do a 730 start on the 22nd (day after tomorrow), if she is willing.    OTC Preparation H suppositories with hydrocortisone can be used if insurance will not cover the other.  I will hold off on any other recs until I see the extent and degree of this colitis.

## 2017-10-12 NOTE — Telephone Encounter (Signed)
She has been admitted and our practice consulted.  No further action needed now - will plan follow up pending colonoscopy findings and hospital course.

## 2017-10-12 NOTE — ED Notes (Signed)
IV team at bedside 

## 2017-10-13 ENCOUNTER — Encounter (HOSPITAL_COMMUNITY)
Admission: EM | Disposition: A | Discharge status: Home / Self Care | Payer: Self-pay | Source: Home / Self Care | Attending: Internal Medicine

## 2017-10-13 ENCOUNTER — Encounter (HOSPITAL_COMMUNITY): Payer: Self-pay | Admitting: *Deleted

## 2017-10-13 ENCOUNTER — Inpatient Hospital Stay (HOSPITAL_COMMUNITY): Payer: BLUE CROSS/BLUE SHIELD | Admitting: Certified Registered Nurse Anesthetist

## 2017-10-13 DIAGNOSIS — K6289 Other specified diseases of anus and rectum: Secondary | ICD-10-CM

## 2017-10-13 DIAGNOSIS — K51811 Other ulcerative colitis with rectal bleeding: Principal | ICD-10-CM

## 2017-10-13 DIAGNOSIS — K51011 Ulcerative (chronic) pancolitis with rectal bleeding: Secondary | ICD-10-CM

## 2017-10-13 DIAGNOSIS — K644 Residual hemorrhoidal skin tags: Secondary | ICD-10-CM

## 2017-10-13 HISTORY — PX: COLONOSCOPY WITH PROPOFOL: SHX5780

## 2017-10-13 HISTORY — PX: BIOPSY: SHX5522

## 2017-10-13 LAB — CBC WITH DIFFERENTIAL/PLATELET
BASOS ABS: 0 10*3/uL (ref 0.0–0.1)
BASOS PCT: 0 %
EOS PCT: 1 %
Eosinophils Absolute: 0.1 10*3/uL (ref 0.0–0.7)
HEMATOCRIT: 35.6 % — AB (ref 36.0–46.0)
Hemoglobin: 11.9 g/dL — ABNORMAL LOW (ref 12.0–15.0)
Lymphocytes Relative: 18 %
Lymphs Abs: 1.7 10*3/uL (ref 0.7–4.0)
MCH: 30.5 pg (ref 26.0–34.0)
MCHC: 33.4 g/dL (ref 30.0–36.0)
MCV: 91.3 fL (ref 78.0–100.0)
MONO ABS: 1.1 10*3/uL — AB (ref 0.1–1.0)
Monocytes Relative: 12 %
NEUTROS ABS: 6.3 10*3/uL (ref 1.7–7.7)
Neutrophils Relative %: 69 %
PLATELETS: 479 10*3/uL — AB (ref 150–400)
RBC: 3.9 MIL/uL (ref 3.87–5.11)
RDW: 14.1 % (ref 11.5–15.5)
WBC: 9.2 10*3/uL (ref 4.0–10.5)

## 2017-10-13 LAB — COMPREHENSIVE METABOLIC PANEL
ALBUMIN: 2.1 g/dL — AB (ref 3.5–5.0)
ALK PHOS: 90 U/L (ref 38–126)
ALT: 12 U/L (ref 0–44)
AST: 13 U/L — AB (ref 15–41)
Anion gap: 10 (ref 5–15)
BUN: 13 mg/dL (ref 8–23)
CO2: 26 mmol/L (ref 22–32)
CREATININE: 1.09 mg/dL — AB (ref 0.44–1.00)
Calcium: 8.7 mg/dL — ABNORMAL LOW (ref 8.9–10.3)
Chloride: 104 mmol/L (ref 98–111)
GFR calc non Af Amer: 53 mL/min — ABNORMAL LOW (ref >60)
GLUCOSE: 115 mg/dL — AB (ref 70–99)
Potassium: 4 mmol/L (ref 3.5–5.1)
SODIUM: 140 mmol/L (ref 135–145)
Total Bilirubin: 0.6 mg/dL (ref 0.3–1.2)
Total Protein: 5.5 g/dL — ABNORMAL LOW (ref 6.5–8.1)

## 2017-10-13 LAB — HEMOGLOBIN A1C
Hgb A1c MFr Bld: 5.7 % — ABNORMAL HIGH (ref 4.8–5.6)
MEAN PLASMA GLUCOSE: 116.89 mg/dL

## 2017-10-13 LAB — LIPID PANEL
Cholesterol: 95 mg/dL (ref 0–200)
HDL: 39 mg/dL — ABNORMAL LOW (ref >40)
LDL CALC: 42 mg/dL (ref 0–99)
TRIGLYCERIDES: 71 mg/dL (ref <150)
Total CHOL/HDL Ratio: 2.4 RATIO
VLDL: 14 mg/dL (ref 0–40)

## 2017-10-13 LAB — TSH: TSH: 0.942 u[IU]/mL (ref 0.350–4.500)

## 2017-10-13 LAB — PHOSPHORUS: PHOSPHORUS: 3.1 mg/dL (ref 2.5–4.6)

## 2017-10-13 LAB — CALPROTECTIN, FECAL: CALPROTECTIN, FECAL: 1036 ug/g — AB (ref 0–120)

## 2017-10-13 LAB — GLUCOSE, CAPILLARY: GLUCOSE-CAPILLARY: 108 mg/dL — AB (ref 70–99)

## 2017-10-13 LAB — MAGNESIUM: Magnesium: 1.7 mg/dL (ref 1.7–2.4)

## 2017-10-13 SURGERY — COLONOSCOPY WITH PROPOFOL
Anesthesia: Monitor Anesthesia Care

## 2017-10-13 MED ORDER — MAGNESIUM SULFATE 4 GM/100ML IV SOLN
4.0000 g | Freq: Once | INTRAVENOUS | Status: AC
  Administered 2017-10-13: 4 g via INTRAVENOUS
  Filled 2017-10-13: qty 100

## 2017-10-13 MED ORDER — METHYLPREDNISOLONE SODIUM SUCC 40 MG IJ SOLR
20.0000 mg | Freq: Three times a day (TID) | INTRAMUSCULAR | Status: DC
  Administered 2017-10-13 – 2017-10-16 (×11): 20 mg via INTRAVENOUS
  Filled 2017-10-13 (×11): qty 1

## 2017-10-13 MED ORDER — LACTATED RINGERS IV SOLN
INTRAVENOUS | Status: DC
  Administered 2017-10-13: 12:00:00 via INTRAVENOUS

## 2017-10-13 MED ORDER — PROPOFOL 500 MG/50ML IV EMUL
INTRAVENOUS | Status: DC | PRN
  Administered 2017-10-13: 100 ug/kg/min via INTRAVENOUS

## 2017-10-13 MED ORDER — PHENYLEPHRINE HCL 10 MG/ML IJ SOLN
INTRAMUSCULAR | Status: DC | PRN
  Administered 2017-10-13 (×3): 80 ug via INTRAVENOUS

## 2017-10-13 MED ORDER — PROPOFOL 10 MG/ML IV BOLUS
INTRAVENOUS | Status: AC
  Filled 2017-10-13: qty 20

## 2017-10-13 MED ORDER — PROPOFOL 10 MG/ML IV BOLUS
INTRAVENOUS | Status: AC
  Filled 2017-10-13: qty 40

## 2017-10-13 MED ORDER — PROPOFOL 10 MG/ML IV BOLUS
INTRAVENOUS | Status: DC | PRN
  Administered 2017-10-13: 40 mg via INTRAVENOUS
  Administered 2017-10-13: 20 mg via INTRAVENOUS

## 2017-10-13 SURGICAL SUPPLY — 22 items

## 2017-10-13 NOTE — Anesthesia Procedure Notes (Signed)
Procedure Name: MAC Date/Time: 10/13/2017 1:24 PM Performed by: West Pugh, CRNA Pre-anesthesia Checklist: Patient identified, Emergency Drugs available, Suction available, Patient being monitored and Timeout performed Patient Re-evaluated:Patient Re-evaluated prior to induction Oxygen Delivery Method: Nasal cannula Preoxygenation: Pre-oxygenation with 100% oxygen Induction Type: IV induction Placement Confirmation: positive ETCO2 Dental Injury: Teeth and Oropharynx as per pre-operative assessment

## 2017-10-13 NOTE — Progress Notes (Signed)
Initial Nutrition Assessment  DOCUMENTATION CODES:   Obesity unspecified  INTERVENTION:    Monitor for diet advancement/toleration  Provide MVI daily  Encourage intake of high protein food options  NUTRITION DIAGNOSIS:   Inadequate oral intake related to vomiting, nausea as evidenced by per patient/family report  GOAL:   Patient will meet greater than or equal to 90% of their needs  MONITOR:   PO intake, Diet advancement, Weight trends, Labs  REASON FOR ASSESSMENT:   Malnutrition Screening Tool    ASSESSMENT:   Patient with PMH significant for HTN, HLD, UC, MI after bilateral knee surgery, and CHF. Presents this admission with complaints of bloody diarrhea with associated persistent sharp abdominal pain. Admitted for acute ulcerative colitis flare.    Pt reports having a loss in appetite since May after her dad had stroke. States she has been under a lot of stress lately and she thinks this contributed to her flare. During this time period she would eat two meals per day that consisted of high protein food options, vegetables, and grains. She denies that certain foods that make her UC symptoms worse. During the last two weeks pt has consumed bites as she could not keep food down. Nausea continued this admission but Zofran is helping to resolve it. She is currently NPO for her colonoscopy. She does not wish to have supplementation this admission. RD encouraged intake of high protein food options.   Pt endorses a UBW of 200 lb and endorses an unknown amount of weight loss. Records are limited in weight history as pt just moved to [place]. Nutrition-Focused physical exam completed.    Medications reviewed and include: MVI with minerals Labs reviewed.   NUTRITION - FOCUSED PHYSICAL EXAM:    Most Recent Value  Orbital Region  No depletion  Upper Arm Region  No depletion  Thoracic and Lumbar Region  No depletion  Buccal Region  No depletion  Temple Region  No depletion   Clavicle Bone Region  No depletion  Clavicle and Acromion Bone Region  No depletion  Scapular Bone Region  No depletion  Dorsal Hand  No depletion  Patellar Region  No depletion  Anterior Thigh Region  No depletion  Posterior Calf Region  No depletion  Edema (RD Assessment)  Mild  Hair  Reviewed  Eyes  Reviewed  Mouth  Reviewed  Skin  Reviewed  Nails  Reviewed     Diet Order:   Diet Order            Diet NPO time specified  Diet effective now              EDUCATION NEEDS:   Education needs have been addressed  Skin:  Skin Assessment: Reviewed RN Assessment  Last BM:  10/13/17  Height:   Ht Readings from Last 1 Encounters:  10/13/17 5\' 7"  (1.702 m)    Weight:   Wt Readings from Last 1 Encounters:  10/13/17 97.5 kg    Ideal Body Weight:  61.4 kg  BMI:  Body mass index is 33.67 kg/m.  Estimated Nutritional Needs:   Kcal:  1600-1800 kcal  Protein:  80-95 grams  Fluid:  >/= 1.6 L/day   Mariana Single RD, LDN Clinical Nutrition Pager # - [phone]

## 2017-10-13 NOTE — Progress Notes (Signed)
PROGRESS NOTE    [name]  TIW:[id number] DOB: [birth year] DOA: 10/12/2017 PCP: Patient, No Pcp Per    Brief Narrative:  62 year old female history of hypertension, hyperlipidemia, history of ulcerative colitis, history of MI status post bilateral knee surgery, history of arthritis, history of C. difficile colitis presenting with bloody diarrhea x2 weeks associated with abdominal pain nausea vomiting.  Patient admitted due to concerns for ulcerative colitis flare.  Patient started on prednisone.  GI following.  Patient for colonoscopy today 10/13/2017.   Assessment & Plan:   Principal Problem:   Ulcerative colitis, acute ([place]) Active Problems:   History of myocardial infarction   AKI (acute kidney injury) ([place])   Hyperglycemia   Hyperlipidemia   Thrombocytosis ([facility])   Leukocytosis  #1 bloody diarrhea likely secondary to ulcerative colitis flare Patient admitted with concerns for ulcerative colitis flare with bloody diarrhea abdominal pain nausea and vomiting.  FOBT done was positive.  C. difficile PCR GI pathogen panel negative.  CT abdomen and pelvis done showed decompressed colon, entire colonic mucosa edematous with suspicion for colitis.  No abscess seen.  Patient seen by labile gastroenterologist, Dr. Linna Hoff is the third and patient has been started on Anusol.  Continue IV fluids.  Steroids have been deferred at this time.  Patient for colonoscopy today.  2.  Prior history of MI (presumed type II non-STEMI) In the setting of prior blood loss for bilateral knee arthroplasty.  Denies any chest pain.  Continue current regimen of metoprolol succinate 50 mg nightly.  Follow.  3.  Acute kidney injury Secondary to prerenal azotemia secondary to GI loss.  Improving with hydration.  Follow.  4.  Hyperlipidemia Patient no longer on a statin.  Fasting lipid panel with a LDL of 42, HDL of 39.  Outpatient follow-up.  5.  Leukocytosis In the setting of ulcerative colitis flare.  Likely a  reactive leukocytosis.  WBC trended down.  Urinalysis nitrite negative leukocytes negative.  C. difficile PCR negative.  GI pathogen panel negative.  Patient afebrile.  No need for antibiotics at this time.  Follow.  6.  Thrombocytosis Likely reactive secondary to ulcerative colitis flare.  Platelet count at admission was 555.  Platelet count trending down and now at 479.  Follow.  7.  Hyperglycemia Hemoglobin A1c 5.7.  Outpatient follow-up.   DVT prophylaxis: SCDs Code Status: Full Family Communication: Updated patient.  No family at bedside. Disposition Plan: Home once clinically improved and cleared medically per gastroenterology.   Consultants:   Gastroenterology: Dr. Rush Landmark 10/12/2017  Procedures:   CT abdomen and pelvis 10/12/2017  Antimicrobials:  None   Subjective: Patient sleeping.  Easily arousable.  Denies any chest pain or shortness of breath.  States currently abdominal pain has improved as she recently received some pain medication.  Noticed some bloody bowel movement this morning.  No nausea or emesis.  Following commands.   Objective: Vitals:   10/12/17 1714 10/12/17 1715 10/12/17 1952 10/13/17 0350  BP: 122/83 115/77 125/79 106/70  Pulse: (!) 115 (!) 136 96 (!) 101  Resp:   18 19  Temp:   98.4 F (36.9 C) 98.2 F (36.8 C)  TempSrc:   Oral Oral  SpO2: 99% 98% 98% 96%  Weight:      Height:        Intake/Output Summary (Last 24 hours) at 10/13/2017 1133 Last data filed at 10/13/2017 0720 Gross per 24 hour  Intake 752.95 ml  Output 2 ml  Net 750.95 ml  Filed Weights   10/12/17 0833  Weight: 97.5 kg    Examination:  General exam: Appears calm and comfortable  Respiratory system: Clear to auscultation. Respiratory effort normal. Cardiovascular system: S1 & S2 heard, RRR. No JVD, murmurs, rubs, gallops or clicks. No pedal edema. Gastrointestinal system: Abdomen is non distended, soft, decreased tenderness to palpation in lower abdomen, no  rebound.  No guarding.  Central nervous system: And oriented.  No focal neurological deficits.   Extremities: Symmetric 5 x 5 power. Skin: No rashes, lesions or ulcers Psychiatry: Judgement and insight appear normal. Mood & affect appropriate.     Data Reviewed: I have personally reviewed following labs and imaging studies  CBC: Recent Labs  Lab 10/08/17 1117 10/12/17 0900 10/13/17 0640  WBC 10.1 12.9* 9.2  NEUTROABS 7.1 9.5 6.3  HGB 13.0 14.4 11.9*  HCT 38.9 42.1 35.6*  MCV 90.7 90.1 91.3  PLT 536.0* 555* 709*   Basic Metabolic Panel: Recent Labs  Lab 10/08/17 1117 10/12/17 0900 10/12/17 1708 10/13/17 0640  NA 136 135  --  140  K 3.9 4.1  --  4.0  CL 98 93*  --  104  CO2 30 29  --  26  GLUCOSE 121* 119*  --  115*  BUN 17 15  --  13  CREATININE 1.55* 1.61*  --  1.09*  CALCIUM 9.7 9.6  --  8.7*  MG  --   --  1.8 1.7  PHOS  --   --  3.8 3.1   GFR: Estimated Creatinine Clearance: 64.2 mL/min (A) (by C-G formula based on SCr of 1.09 mg/dL (H)). Liver Function Tests: Recent Labs  Lab 10/12/17 0900 10/13/17 0640  AST 16 13*  ALT 14 12  ALKPHOS 100 90  BILITOT 0.7 0.6  PROT 6.6 5.5*  ALBUMIN 2.7* 2.1*   Recent Labs  Lab 10/12/17 0900  LIPASE 22   No results for input(s): AMMONIA in the last 168 hours. Coagulation Profile: Recent Labs  Lab 10/12/17 0900  INR 0.98   Cardiac Enzymes: No results for input(s): CKTOTAL, CKMB, CKMBINDEX, TROPONINI in the last 168 hours. BNP (last 3 results) No results for input(s): PROBNP in the last 8760 hours. HbA1C: Recent Labs    10/13/17 0640  HGBA1C 5.7*   CBG: Recent Labs  Lab 10/13/17 0715  GLUCAP 108*   Lipid Profile: Recent Labs    10/13/17 0640  CHOL 95  HDL 39*  LDLCALC 42  TRIG 71  CHOLHDL 2.4   Thyroid Function Tests: Recent Labs    10/13/17 0640  TSH 0.942   Anemia Panel: No results for input(s): VITAMINB12, FOLATE, FERRITIN, TIBC, IRON, RETICCTPCT in the last 72 hours. Sepsis  Labs: Recent Labs  Lab 10/12/17 1033  LATICACIDVEN 1.82    Recent Results (from the past 240 hour(s))  Gastrointestinal Panel by PCR , Stool     Status: None   Collection Time: 10/12/17  9:04 AM  Result Value Ref Range Status   Campylobacter species NOT DETECTED NOT DETECTED Final   Plesimonas shigelloides NOT DETECTED NOT DETECTED Final   Salmonella species NOT DETECTED NOT DETECTED Final   Yersinia enterocolitica NOT DETECTED NOT DETECTED Final   Vibrio species NOT DETECTED NOT DETECTED Final   Vibrio cholerae NOT DETECTED NOT DETECTED Final   Enteroaggregative E coli (EAEC) NOT DETECTED NOT DETECTED Final   Enteropathogenic E coli (EPEC) NOT DETECTED NOT DETECTED Final   Enterotoxigenic E coli (ETEC) NOT DETECTED NOT DETECTED Final  Shiga like toxin producing E coli (STEC) NOT DETECTED NOT DETECTED Final   Shigella/Enteroinvasive E coli (EIEC) NOT DETECTED NOT DETECTED Final   Cryptosporidium NOT DETECTED NOT DETECTED Final   Cyclospora cayetanensis NOT DETECTED NOT DETECTED Final   Entamoeba histolytica NOT DETECTED NOT DETECTED Final   Giardia lamblia NOT DETECTED NOT DETECTED Final   Adenovirus F40/41 NOT DETECTED NOT DETECTED Final   Astrovirus NOT DETECTED NOT DETECTED Final   Norovirus GI/GII NOT DETECTED NOT DETECTED Final   Rotavirus A NOT DETECTED NOT DETECTED Final   Sapovirus (I, II, IV, and V) NOT DETECTED NOT DETECTED Final    Comment: Performed at [facility], [address] [place], [place] [address]  C difficile quick scan w PCR reflex     Status: None   Collection Time: 10/12/17  9:04 AM  Result Value Ref Range Status   C Diff antigen NEGATIVE NEGATIVE Final   C Diff toxin NEGATIVE NEGATIVE Final   C Diff interpretation No C. difficile detected.  Final    Comment: Performed at [facility], [place] [address] [place], [place] [address]         Radiology Studies: Ct Abdomen Pelvis W Contrast  Result Date:  10/12/2017 CLINICAL DATA:  Nausea and vomiting for 2 weeks, history of ulcerative colitis, patient could not drink oral contrast due to vomiting EXAM: CT ABDOMEN AND PELVIS WITH CONTRAST TECHNIQUE: Multidetector CT imaging of the abdomen and pelvis was performed using the standard protocol following bolus administration of intravenous contrast. CONTRAST:  23mL OMNIPAQUE IOHEXOL 300 MG/ML  SOLN COMPARISON:  None. FINDINGS: Lower chest: The lung bases are clear. The heart is within normal limits in size. No pericardial effusion is seen. Hepatobiliary: Tiny subcentimeter low-attenuation structures in the liver most consistent with a benign process such as cysts. No focal hepatic abnormality is seen. The gallbladder is slightly distended but no calcified gallstones are seen. Pancreas: The pancreas is normal in size and the pancreatic duct is not dilated. Spleen: The spleen is unremarkable. Adrenals/Urinary Tract: The adrenal glands appear normal. The kidneys enhance with no mass or calculus. On delayed images, the pelvocaliceal systems are unremarkable. Ureters appear normal in caliber. The urinary bladder is not well distended but no abnormality is noted. Stomach/Bowel: The stomach is completely decompressed and cannot be well assessed. No abnormality of small bowel is seen. The colon however is completely decompressed and does appear to be somewhat edematous diffusely. Diffuse colitis is a definite consideration. No focal abscess is seen. The terminal ileum and the appendix are unremarkable. Vascular/Lymphatic: The abdominal aorta is normal in caliber. No focal aneurysmal dilatation is seen. No adenopathy is noted. Reproductive: The uterus is normal in size. No adnexal lesion seen with probable tiny follicles on the right. No free fluid is seen within the pelvis. Other: No abdominal wall hernia is seen. Musculoskeletal: There degenerative changes involving both SI joints with sclerosis and irregularity. The lumbar  vertebrae are in normal alignment with intervertebral disc spaces appear grossly normal. However considerable degenerative change does involve the facet joints of the mid to lower lumbar spine diffusely. IMPRESSION: 1. Although the colon is completely decompressed, the entire colonic mucosa appears somewhat edematous, suspicious for diffuse colitis. Correlate clinically. No abscess is seen. 2. Degenerative changes throughout the facet joints of the lower lumbar spine and within the SI joints. Electronically Signed   By: Ivar Drape M.D.   On: 10/12/2017 13:25        Scheduled  Meds: . hydrocortisone  25 mg Rectal BID  . metoprolol succinate  50 mg Oral Daily  . multivitamin with minerals  1 tablet Oral Daily   Continuous Infusions: . sodium chloride 20 mL/hr at 10/13/17 0915  . sodium chloride Stopped (10/13/17 0853)  . sodium chloride Stopped (10/12/17 1112)     LOS: 1 day    Time spent: 40 minutes    Irine Seal, MD [facility] Pager [phone]  If 7PM-7AM, please contact night-coverage [email or web address] Password [facility] 10/13/2017, 11:33 AM

## 2017-10-13 NOTE — Progress Notes (Signed)
PT Cancellation Note  Patient Details Name: [name] MRN: [id number] DOB: 1955-07-19   Cancelled Treatment:    Reason Eval/Treat Not Completed: PT screened, no needs identified, will sign off, per RN, patient is independent.   Claretha Cooper 10/13/2017, 11:20 AM Tresa Endo PT [phone]

## 2017-10-13 NOTE — Anesthesia Preprocedure Evaluation (Addendum)
Anesthesia Evaluation  Patient identified by MRN, date of birth, ID band Patient awake    Reviewed: Allergy & Precautions, H&P , NPO status , Patient's Chart, lab work & pertinent test results, reviewed documented beta blocker date and time   Airway Mallampati: II  TM Distance: >3 FB Neck ROM: full    Dental no notable dental hx.    Pulmonary neg pulmonary ROS,    Pulmonary exam normal breath sounds clear to auscultation       Cardiovascular Exercise Tolerance: Good hypertension, Pt. on medications + Past MI  negative cardio ROS   Rhythm:regular Rate:Normal     Neuro/Psych negative neurological ROS  negative psych ROS   GI/Hepatic Neg liver ROS, PUD,   Endo/Other  negative endocrine ROS  Renal/GU Renal disease  negative genitourinary   Musculoskeletal  (+) Arthritis ,   Abdominal   Peds  Hematology negative hematology ROS (+)   Anesthesia Other Findings   Reproductive/Obstetrics negative OB ROS                             Anesthesia Physical Anesthesia Plan  ASA: II  Anesthesia Plan: MAC   Post-op Pain Management:    Induction: Intravenous  PONV Risk Score and Plan:   Airway Management Planned: Mask and Natural Airway  Additional Equipment:   Intra-op Plan:   Post-operative Plan:   Informed Consent: I have reviewed the patients History and Physical, chart, labs and discussed the procedure including the risks, benefits and alternatives for the proposed anesthesia with the patient or authorized representative who has indicated his/her understanding and acceptance.   Dental Advisory Given  Plan Discussed with: CRNA, Anesthesiologist and Surgeon  Anesthesia Plan Comments:        Anesthesia Quick Evaluation

## 2017-10-13 NOTE — Care Management Note (Signed)
Case Management Note  Patient Details  Name: [name] MRN: [id number] Date of Birth: 1955-05-04  Subjective/Objective:                   62 y.o. female with medical history significant of hypertension, hyperlipidemia no longer taking her statin, history of ulcerative colitis, history of MI after bilateral knee surgery likely attributed to blood loss, history of arthritis, and history of C. difficile seal diarrhea and other comorbidities who presents with a chief complaint of bloody diarrhea for last [redacted] weeks along associated persistent sharp abdominal pain along with nausea and vomiting.  Patient states that symptoms started 2 weeks ago and patient feels very similar to previous UC  flare. Started with bloody diarrhea and averageing 10-11 bowel movements and now dow to 8. Nausea and vomiting started a few days after the diarrhea. Had SOB. No CP and no Lightheadeness or dizziness.  Feels weak and Fatigued. Abominal Pain started same time as diarrhea and lower abdominal pain 7/10 and persistent. Sharp stabbing pain.  No current pain right now as she received IV Dilaudid.  Recently saw Dr. Loletha Carrow of GI who started her on prednisone suppositories however she did not pick these up yet.  Because of symptoms persisting she presented to the ED for further evaluation.  [facility] was called to admit this patient for a acute ulcerative colitis flare and gastroenterology was consulted for further evaluation and recommendations and are planning to take the patient for colonoscopy in a.m.(10/13/17)   Action/Plan: Progression-colonoscopy pending Cm- no needs present at this time.lives at home with aunt  Expected Discharge Date:  (unknown)               Expected Discharge Plan:  Home/Self Care  In-House Referral:     Discharge planning Services  CM Consult  Post Acute Care Choice:    Choice offered to:     DME Arranged:    DME Agency:     HH Arranged:    HH Agency:     Status of Service:  In process,  will continue to follow  If discussed at Long Length of Stay Meetings, dates discussed:    Additional Comments:  Leeroy Cha, RN 10/13/2017, 11:04 AM

## 2017-10-13 NOTE — Transfer of Care (Signed)
Immediate Anesthesia Transfer of Care Note  Patient: [name]  Procedure(s) Performed: COLONOSCOPY WITH PROPOFOL (N/A ) BIOPSY  Patient Location: PACU  Anesthesia Type:MAC  Level of Consciousness: awake, alert , oriented and patient cooperative  Airway & Oxygen Therapy: Patient Spontanous Breathing and Patient connected to nasal cannula oxygen  Post-op Assessment: Report given to RN and Post -op Vital signs reviewed and stable  Post vital signs: Reviewed and stable  Last Vitals:  Vitals Value Taken Time  BP 121/65 10/13/2017  1:54 PM  Temp    Pulse 97 10/13/2017  1:56 PM  Resp 19 10/13/2017  1:56 PM  SpO2 100 % 10/13/2017  1:56 PM  Vitals shown include unvalidated device data.  Last Pain:  Vitals:   10/13/17 1140  TempSrc: Oral  PainSc: 0-No pain      Patients Stated Pain Goal: 0 (52/77/82 4235)  Complications: No apparent anesthesia complications

## 2017-10-13 NOTE — Anesthesia Postprocedure Evaluation (Signed)
Anesthesia Post Note  Patient: [name]  Procedure(s) Performed: COLONOSCOPY WITH PROPOFOL (N/A ) BIOPSY     Patient location during evaluation: PACU Anesthesia Type: MAC Level of consciousness: awake and alert Pain management: pain level controlled Vital Signs Assessment: post-procedure vital signs reviewed and stable Respiratory status: spontaneous breathing, nonlabored ventilation, respiratory function stable and patient connected to nasal cannula oxygen Cardiovascular status: stable and blood pressure returned to baseline Postop Assessment: no apparent nausea or vomiting Anesthetic complications: no    Last Vitals:  Vitals:   10/13/17 1410 10/13/17 1445  BP: (!) 105/59 130/86  Pulse: 97 (!) 112  Resp: 14 20  Temp:  (!) 36.3 C  SpO2: 96% 100%    Last Pain:  Vitals:   10/13/17 1500  TempSrc:   PainSc: 4                  Shearon Clonch

## 2017-10-13 NOTE — Op Note (Signed)
[facility] Patient Name: [name] Procedure Date: 10/13/2017 MRN: [id number] Attending MD: Justice Britain , MD Date of Birth: December 11, 1955 CSN: [id number] Age: 62 Admit Type: Inpatient Procedure:                Flexible Sigmoidoscopy Indications:              Rectal hemorrhage, Hematochezia, Abnormal CT of the                            GI tract, Diarrhea (presumed secondary to                            inflammatory bowel disease) Providers:                Justice Britain, MD, Cleda Daub, RN, Cherylynn Ridges, Technician, Christell Faith, CRNA Referring MD:             Estill Cotta. Loletha Carrow, MD Medicines:                Monitored Anesthesia Care Complications:            No immediate complications. Estimated Blood Loss:     Estimated blood loss was minimal. Procedure:                Pre-Anesthesia Assessment:                           - Prior to the procedure, a History and Physical                            was performed, and patient medications and                            allergies were reviewed. The patient's tolerance of                            previous anesthesia was also reviewed. The risks                            and benefits of the procedure and the sedation                            options and risks were discussed with the patient.                            All questions were answered, and informed consent                            was obtained. Prior Anticoagulants: The patient has                            taken no previous anticoagulant or antiplatelet  agents. ASA Grade Assessment: II - A patient with                            mild systemic disease. After reviewing the risks                            and benefits, the patient was deemed in                            satisfactory condition to undergo the procedure.                           After obtaining informed consent, the scope was                             passed under direct vision. The [id number]-[id number]                            ([id number] Olympus peds colonscope was introduced                            through the anus and advanced to the the left                            transverse colon. After obtaining informed consent,                            the scope was passed under direct vision.The                            flexible sigmoidoscopy was accomplished without                            difficulty. The patient tolerated the procedure                            well. The quality of the bowel preparation was good. Scope In: 1:38:39 PM Scope Out: 1:48:04 PM Total Procedure Duration: 0 hours 9 minutes 25 seconds  Findings:      The digital rectal exam findings include rectal tenderness and       non-thrombosed external hemorrhoids. Pertinent negatives include no       palpable rectal lesions.      Inflammation characterized by altered vascularity, congestion (edema),       erosions, erythema, friability, granularity, mucus and serpentine       ulcerations was found in a continuous and circumferential pattern from       the anus to the transverse colon. No sites were spared. This was severe.       Biopsies were taken with a cold forceps for histology. Impression:               - Rectal tenderness and non-thrombosed external                            hemorrhoids found on digital rectal exam.                           -  Pancolitis ulcerative colitis. Inflammation was                            found from the anus to the transverse colon. This                            was severe. Biopsied. Moderate Sedation:      N/A- Per Anesthesia Care Recommendation:           - The patient will be observed post-procedure,                            until all discharge criteria are met.                           - Return patient to referring hospital for ongoing                            care.                           -  Discussed the risks/benefits of steroids (she                            previously has described issues of steroid                            intolerance) and she agrees to use of this in the                            short-term with hope for the long term as well.                           - Start Solumedrol 20 mg Q8H.                           - I am concerned about the extensiveness of the                            colitis that is present. If we are unable to                            salvage the patient with steroids due to                            intolerance or too significant inflammatory state,                            she may need biologic rescue vs surgical                            intervention.                           - Will send Biologic/Immunomodulator Initiation  laboratories as they will likely be needed in the                            near future.                           - Surgical consultation tomorrow would be                            reasonable.                           - The findings and recommendations were discussed                            with the patient.                           - The findings and recommendations were discussed                            with the referring physician. Procedure Code(s):        --- Professional ---                           [id number], Sigmoidoscopy, flexible; with biopsy, single                            or multiple Diagnosis Code(s):        --- Professional ---                           K64.4, Residual hemorrhoidal skin tags                           K51.011, Ulcerative (chronic) pancolitis with                            rectal bleeding                           K62.89, Other specified diseases of anus and rectum                           K62.5, Hemorrhage of anus and rectum                           K92.1, Melena (includes Hematochezia)                           R19.7, Diarrhea,  unspecified                           R93.3, Abnormal findings on diagnostic imaging of                            other parts of digestive tract CPT copyright 2017 [employer] Association. All rights  reserved. The codes documented in this report are preliminary and upon coder review may  be revised to meet current compliance requirements. Justice Britain, MD 10/13/2017 5:22:25 PM Number of Addenda: 0

## 2017-10-13 NOTE — Interval H&P Note (Signed)
History and Physical Interval Note:  10/13/2017 1:06 PM  [name]  has presented today for surgery, with the diagnosis of Colitis  The various methods of treatment have been discussed with the patient and family. After consideration of risks, benefits and other options for treatment, the patient has consented to  Procedure(s): COLONOSCOPY WITH PROPOFOL (N/A) as a surgical intervention .  The patient's history has been reviewed, patient examined, no change in status, stable for surgery.  I have reviewed the patient's chart and labs.  Questions were answered to the patient's satisfaction.    The risks and benefits of endoscopic evaluation were discussed with the patient; these include but are not limited to the risk of perforation, infection, bleeding, missed lesions, lack of diagnosis, severe illness requiring hospitalization, as well as anesthesia and sedation related illnesses.  The patient is agreeable to proceed.    [employer]

## 2017-10-13 NOTE — Progress Notes (Signed)
OT Cancellation Note  Patient Details Name: Aroush Chasse MRN: [id number] DOB: Feb 10, 1956   Cancelled Treatment:    Reason Eval/Treat Not Completed: Other (comment).  Independent with adls and mobility per RN  Isabeau Mccalla 10/13/2017, 12:27 PM  Lesle Chris, OTR/L [phone] 10/13/2017

## 2017-10-14 ENCOUNTER — Encounter (HOSPITAL_COMMUNITY): Payer: Self-pay | Admitting: Gastroenterology

## 2017-10-14 DIAGNOSIS — E860 Dehydration: Secondary | ICD-10-CM

## 2017-10-14 LAB — BASIC METABOLIC PANEL
Anion gap: 8 (ref 5–15)
BUN: 12 mg/dL (ref 8–23)
CO2: 25 mmol/L (ref 22–32)
CREATININE: 0.83 mg/dL (ref 0.44–1.00)
Calcium: 8.6 mg/dL — ABNORMAL LOW (ref 8.9–10.3)
Chloride: 103 mmol/L (ref 98–111)
Glucose, Bld: 124 mg/dL — ABNORMAL HIGH (ref 70–99)
Potassium: 4.5 mmol/L (ref 3.5–5.1)
SODIUM: 136 mmol/L (ref 135–145)

## 2017-10-14 LAB — CBC WITH DIFFERENTIAL/PLATELET
BASOS PCT: 0 %
Basophils Absolute: 0 10*3/uL (ref 0.0–0.1)
EOS ABS: 0 10*3/uL (ref 0.0–0.7)
EOS PCT: 0 %
HCT: 33.1 % — ABNORMAL LOW (ref 36.0–46.0)
HEMOGLOBIN: 11 g/dL — AB (ref 12.0–15.0)
LYMPHS ABS: 0.8 10*3/uL (ref 0.7–4.0)
Lymphocytes Relative: 10 %
MCH: 30.6 pg (ref 26.0–34.0)
MCHC: 33.2 g/dL (ref 30.0–36.0)
MCV: 91.9 fL (ref 78.0–100.0)
Monocytes Absolute: 0.6 10*3/uL (ref 0.1–1.0)
Monocytes Relative: 7 %
Neutro Abs: 6.7 10*3/uL (ref 1.7–7.7)
Neutrophils Relative %: 83 %
PLATELETS: 439 10*3/uL — AB (ref 150–400)
RBC: 3.6 MIL/uL — AB (ref 3.87–5.11)
RDW: 14.1 % (ref 11.5–15.5)
WBC: 8.1 10*3/uL (ref 4.0–10.5)

## 2017-10-14 LAB — HEPATITIS B SURFACE ANTIGEN: HEP B S AG: NEGATIVE

## 2017-10-14 LAB — GLUCOSE, CAPILLARY: GLUCOSE-CAPILLARY: 110 mg/dL — AB (ref 70–99)

## 2017-10-14 LAB — MAGNESIUM: Magnesium: 2.1 mg/dL (ref 1.7–2.4)

## 2017-10-14 NOTE — Consult Note (Addendum)
[facility] Surgery Consult Note  [name] Jul 04, 1955  [id number].    Requesting MD: Mansouraty Chief Complaint/Reason for Consult: Ulcerative Colitis HPI:  Patient is a 62 year old female who presented to [facility] ED with a 2 week history of bloody diarrhea, abdominal pain, nausea and vomiting. Patient has had past episodes of ulcerative colitis but those were many years apart and treated successfully with steroids and biologic agents. Patient states previous 2 episodes were less severe and precipitated by the loss of her husband and brother. She had talked to a surgeon in the past but it was not felt that surgery was needed. She had been maintained on remicade but then had some issues with insurance previously and went off medication. 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 Past abdominal surgeries include 3 cesarean sections. PMH significant for HTN. Not on any blood thinning medications. Patient denies tobacco, alcohol or illicit drug use. She underwent flex-sig yesterday and was started on steroids, since this she is feeling much better. Still having bloody stools but reports decrease in amount. Hopeful that she does not require surgery.   ROS: Review of Systems  Constitutional: Negative for chills and fever.  Respiratory: Negative for cough and shortness of breath.   Cardiovascular: Negative for chest pain and palpitations.  Gastrointestinal: Positive for abdominal pain, blood in stool, diarrhea, nausea and vomiting.  Genitourinary: Negative for dysuria, frequency and urgency.  All other systems reviewed and are negative.   Family History  Problem Relation Age of Onset  . Stroke Mother   . Congestive Heart Failure Father   . Obesity Sister   . Hypertension Sister   . Diabetes Brother   . Thyroid disease Daughter     Past Medical History:  Diagnosis Date  . Arthritis   . C. difficile diarrhea    . Heart attack ([place]) 10/24/2008  . HLD (hyperlipidemia)   . HTN (hypertension)   . UC (ulcerative colitis) [facility])     Past Surgical History:  Procedure Laterality Date  . CESAREAN SECTION     x 3  . TONSILLECTOMY     age 89  . TOTAL KNEE ARTHROPLASTY Bilateral 10/22/2008    Social History:  reports that she has never smoked. She has never used smokeless tobacco. She reports that she drank alcohol. She reports that she does not use drugs.  Allergies:  Allergies  Allergen Reactions  . Prednisone Other (See Comments)    Tachycardia, nervous, sweating , jitters    Medications Prior to Admission  Medication Sig Dispense Refill  . acetaminophen (TYLENOL) 500 MG tablet Take 500 mg by mouth every 6 (six) hours as needed (For arthritis pain.).     Marland Kitchen Calcium Carb-Cholecalciferol (CALCIUM-VITAMIN D) 600-400 MG-UNIT TABS Take 1 tablet by mouth daily.    Marland Kitchen dicyclomine (BENTYL) 10 MG capsule Take 1 capsule (10 mg total) by mouth every 8 (eight) hours as needed for spasms. 90 capsule 0  . metoprolol succinate (TOPROL-XL) 50 MG 24 hr tablet Take 50 mg by mouth daily. Take with or immediately following a meal.    . Multiple Vitamin (MULTIVITAMIN) tablet Take 1 tablet by mouth daily.    . ondansetron (ZOFRAN) 4 MG tablet Take 1 tablet (4 mg total) by mouth every 8 (eight) hours as needed for nausea or vomiting. 30 tablet 1  . hydrocortisone (ANUSOL-HC) 25 MG suppository Place 1 suppository (25 mg total) rectally 2 (  two) times daily. (Patient not taking: Reported on 10/12/2017) 14 suppository 0    Blood pressure 118/76, pulse 91, temperature 98.3 F (36.8 C), resp. rate 19, height 5' 7"  (1.702 m), weight 95.7 kg, SpO2 95 %. Physical Exam: Physical Exam  Constitutional: She is oriented to person, place, and time. She appears well-developed and well-nourished. She is cooperative.  Non-toxic appearance. No distress.  HENT:  Head: Normocephalic and atraumatic.  Right Ear: External ear normal.   Left Ear: External ear normal.  Nose: Nose normal.  Mouth/Throat: Mucous membranes are normal.  Eyes: Conjunctivae, EOM and lids are normal.  Pupils equal and round   Neck: Normal range of motion and phonation normal. Neck supple.  Cardiovascular: Normal rate and regular rhythm.  Pulses:      Radial pulses are 2+ on the right side, and 2+ on the left side.       Dorsalis pedis pulses are 2+ on the right side, and 2+ on the left side.  Pulmonary/Chest: Effort normal and breath sounds normal.  Abdominal: Soft. Bowel sounds are normal. She exhibits distension (mild). There is no hepatosplenomegaly. There is no tenderness. There is no rigidity, no rebound and no guarding. No hernia.  Musculoskeletal:  ROM grossly intact in bilateral upper and lower extremities  Neurological: She is alert and oriented to person, place, and time.  Skin: Skin is warm, dry and intact.  Psychiatric: She has a normal mood and affect. Her speech is normal and behavior is normal.    Results for orders placed or performed during the hospital encounter of 10/12/17 (from the past 48 hour(s))  I-Stat CG4 Lactic Acid, ED     Status: None   Collection Time: 10/12/17 10:33 AM  Result Value Ref Range   Lactic Acid, Venous 1.82 0.5 - 1.9 mmol/L  Sedimentation rate     Status: None   Collection Time: 10/12/17  2:57 PM  Result Value Ref Range   Sed Rate 0 0 - 22 mm/hr    Comment: Performed at [facility], [place] [address] [place], [place] [address]  Urinalysis, Routine w reflex microscopic     Status: Abnormal   Collection Time: 10/12/17  3:37 PM  Result Value Ref Range   Color, Urine YELLOW YELLOW   APPearance CLEAR CLEAR   Specific Gravity, Urine >1.046 (H) 1.005 - 1.030   pH 5.0 5.0 - 8.0   Glucose, UA NEGATIVE NEGATIVE mg/dL   Hgb urine dipstick NEGATIVE NEGATIVE   Bilirubin Urine NEGATIVE NEGATIVE   Ketones, ur 20 (A) NEGATIVE mg/dL   Protein, ur NEGATIVE NEGATIVE mg/dL   Nitrite NEGATIVE  NEGATIVE   Leukocytes, UA NEGATIVE NEGATIVE    Comment: Performed at [facility] [address] [place], [place] [address]  Calprotectin, Fecal     Status: Abnormal   Collection Time: 10/12/17  3:37 PM  Result Value Ref Range   Calprotectin, Fecal 1,036 (H) 0 - 120 ug/g    Comment: (NOTE) Concentration     Interpretation   Follow-Up <16 - 50 ug/g     Normal           None >50 -120 ug/g     Borderline       Re-evaluate in 4-6 weeks    >120 ug/g     Abnormal         Repeat as clinically  indicated Performed At: [facility] [place], [place] [id number] Rush Farmer MD TO:[id number]   Magnesium     Status: None   Collection Time: 10/12/17  5:08 PM  Result Value Ref Range   Magnesium 1.8 1.7 - 2.4 mg/dL    Comment: Performed at [facility], [place] [address] [place], [place] [address]  Phosphorus     Status: None   Collection Time: 10/12/17  5:08 PM  Result Value Ref Range   Phosphorus 3.8 2.5 - 4.6 mg/dL    Comment: Performed at [facility], [place] [address] [place], [place] [address]  TSH     Status: None   Collection Time: 10/13/17  6:40 AM  Result Value Ref Range   TSH 0.942 0.350 - 4.500 uIU/mL    Comment: Performed by a 3rd Generation assay with a functional sensitivity of <=0.01 uIU/mL. Performed at [facility], [place] [address] [place], [place] [address]   Hemoglobin A1c     Status: Abnormal   Collection Time: 10/13/17  6:40 AM  Result Value Ref Range   Hgb A1c MFr Bld 5.7 (H) 4.8 - 5.6 %    Comment: (NOTE) Pre diabetes:          5.7%-6.4% Diabetes:              >6.4% Glycemic control for   <7.0% adults with diabetes    Mean Plasma Glucose 116.89 mg/dL    Comment: Performed at [facility] [address] [place], [place] [address]  Comprehensive metabolic panel     Status: Abnormal   Collection Time: 10/13/17  6:40 AM   Result Value Ref Range   Sodium 140 135 - 145 mmol/L   Potassium 4.0 3.5 - 5.1 mmol/L   Chloride 104 98 - 111 mmol/L   CO2 26 22 - 32 mmol/L   Glucose, Bld 115 (H) 70 - 99 mg/dL   BUN 13 8 - 23 mg/dL   Creatinine, Ser 1.09 (H) 0.44 - 1.00 mg/dL   Calcium 8.7 (L) 8.9 - 10.3 mg/dL   Total Protein 5.5 (L) 6.5 - 8.1 g/dL   Albumin 2.1 (L) 3.5 - 5.0 g/dL   AST 13 (L) 15 - 41 U/L   ALT 12 0 - 44 U/L   Alkaline Phosphatase 90 38 - 126 U/L   Total Bilirubin 0.6 0.3 - 1.2 mg/dL   GFR calc non Af Amer 53 (L) >60 mL/min   GFR calc Af Amer >60 >60 mL/min    Comment: (NOTE) The eGFR has been calculated using the CKD EPI equation. This calculation has not been validated in all clinical situations. eGFR's persistently <60 mL/min signify possible Chronic Kidney Disease.    Anion gap 10 5 - 15    Comment: Performed at [facility], [place] [address], [place], [place] [address]  Lipid panel     Status: Abnormal   Collection Time: 10/13/17  6:40 AM  Result Value Ref Range   Cholesterol 95 0 - 200 mg/dL   Triglycerides 71 <150 mg/dL   HDL 39 (L) >40 mg/dL   Total CHOL/HDL Ratio 2.4 RATIO   VLDL 14 0 - 40 mg/dL   LDL Cholesterol 42 0 - 99 mg/dL    Comment:        Total Cholesterol/HDL:CHD Risk Coronary Heart Disease Risk Table  Men   Women  1/2 Average Risk   3.4   3.3  Average Risk       5.0   4.4  2 X Average Risk   9.6   7.1  3 X Average Risk  23.4   11.0        Use the calculated Patient Ratio above and the CHD Risk Table to determine the patient's CHD Risk.        ATP III CLASSIFICATION (LDL):  <100     mg/dL   Optimal  100-129  mg/dL   Near or Above                    Optimal  130-159  mg/dL   Borderline  160-189  mg/dL   High  >190     mg/dL   Very High Performed at [facility] [address], [place], [place] [address]   CBC with Differential/Platelet     Status: Abnormal   Collection Time: 10/13/17  6:40 AM   Result Value Ref Range   WBC 9.2 4.0 - 10.5 K/uL   RBC 3.90 3.87 - 5.11 MIL/uL   Hemoglobin 11.9 (L) 12.0 - 15.0 g/dL   HCT 35.6 (L) 36.0 - 46.0 %   MCV 91.3 78.0 - 100.0 fL   MCH 30.5 26.0 - 34.0 pg   MCHC 33.4 30.0 - 36.0 g/dL   RDW 14.1 11.5 - 15.5 %   Platelets 479 (H) 150 - 400 K/uL   Neutrophils Relative % 69 %   Neutro Abs 6.3 1.7 - 7.7 K/uL   Lymphocytes Relative 18 %   Lymphs Abs 1.7 0.7 - 4.0 K/uL   Monocytes Relative 12 %   Monocytes Absolute 1.1 (H) 0.1 - 1.0 K/uL   Eosinophils Relative 1 %   Eosinophils Absolute 0.1 0.0 - 0.7 K/uL   Basophils Relative 0 %   Basophils Absolute 0.0 0.0 - 0.1 K/uL    Comment: Performed at [facility], [place] [address], [place], [place] [address]  Magnesium     Status: None   Collection Time: 10/13/17  6:40 AM  Result Value Ref Range   Magnesium 1.7 1.7 - 2.4 mg/dL    Comment: Performed at [facility], [place] [address], [place], [place] [address]  Phosphorus     Status: None   Collection Time: 10/13/17  6:40 AM  Result Value Ref Range   Phosphorus 3.1 2.5 - 4.6 mg/dL    Comment: Performed at [facility], [place] [address] [place], [place] [address]  Glucose, capillary     Status: Abnormal   Collection Time: 10/13/17  7:15 AM  Result Value Ref Range   Glucose-Capillary 108 (H) 70 - 99 mg/dL  Hepatitis B surface antigen     Status: None   Collection Time: 10/13/17  4:00 PM  Result Value Ref Range   Hepatitis B Surface Ag Negative Negative    Comment: (NOTE) Performed At: [facility] [place], [place] [id number] Rush Farmer MD WS:[id number]   Magnesium     Status: None   Collection Time: 10/14/17  6:00 AM  Result Value Ref Range   Magnesium 2.1 1.7 - 2.4 mg/dL    Comment: Performed at [facility], [place] [address], [place], [place] [address]  CBC with Differential/Platelet     Status: Abnormal   Collection Time: 10/14/17  6:00 AM   Result Value Ref Range  WBC 8.1 4.0 - 10.5 K/uL   RBC 3.60 (L) 3.87 - 5.11 MIL/uL   Hemoglobin 11.0 (L) 12.0 - 15.0 g/dL   HCT 33.1 (L) 36.0 - 46.0 %   MCV 91.9 78.0 - 100.0 fL   MCH 30.6 26.0 - 34.0 pg   MCHC 33.2 30.0 - 36.0 g/dL   RDW 14.1 11.5 - 15.5 %   Platelets 439 (H) 150 - 400 K/uL   Neutrophils Relative % 83 %   Neutro Abs 6.7 1.7 - 7.7 K/uL   Lymphocytes Relative 10 %   Lymphs Abs 0.8 0.7 - 4.0 K/uL   Monocytes Relative 7 %   Monocytes Absolute 0.6 0.1 - 1.0 K/uL   Eosinophils Relative 0 %   Eosinophils Absolute 0.0 0.0 - 0.7 K/uL   Basophils Relative 0 %   Basophils Absolute 0.0 0.0 - 0.1 K/uL    Comment: Performed at [facility], [place] [address], [place], [place] [address]  Basic metabolic panel     Status: Abnormal   Collection Time: 10/14/17  6:00 AM  Result Value Ref Range   Sodium 136 135 - 145 mmol/L   Potassium 4.5 3.5 - 5.1 mmol/L   Chloride 103 98 - 111 mmol/L   CO2 25 22 - 32 mmol/L   Glucose, Bld 124 (H) 70 - 99 mg/dL   BUN 12 8 - 23 mg/dL   Creatinine, Ser 0.83 0.44 - 1.00 mg/dL   Calcium 8.6 (L) 8.9 - 10.3 mg/dL   GFR calc non Af Amer >60 >60 mL/min   GFR calc Af Amer >60 >60 mL/min    Comment: (NOTE) The eGFR has been calculated using the CKD EPI equation. This calculation has not been validated in all clinical situations. eGFR's persistently <60 mL/min signify possible Chronic Kidney Disease.    Anion gap 8 5 - 15    Comment: Performed at [facility], [place] [address] [place], [place] [address]  Glucose, capillary     Status: Abnormal   Collection Time: 10/14/17  7:41 AM  Result Value Ref Range   Glucose-Capillary 110 (H) 70 - 99 mg/dL   Ct Abdomen Pelvis W Contrast  Result Date: 10/12/2017 CLINICAL DATA:  Nausea and vomiting for 2 weeks, history of ulcerative colitis, patient could not drink oral contrast due to vomiting EXAM: CT ABDOMEN AND PELVIS WITH CONTRAST TECHNIQUE: Multidetector CT imaging of  the abdomen and pelvis was performed using the standard protocol following bolus administration of intravenous contrast. CONTRAST:  67m OMNIPAQUE IOHEXOL 300 MG/ML  SOLN COMPARISON:  None. FINDINGS: Lower chest: The lung bases are clear. The heart is within normal limits in size. No pericardial effusion is seen. Hepatobiliary: Tiny subcentimeter low-attenuation structures in the liver most consistent with a benign process such as cysts. No focal hepatic abnormality is seen. The gallbladder is slightly distended but no calcified gallstones are seen. Pancreas: The pancreas is normal in size and the pancreatic duct is not dilated. Spleen: The spleen is unremarkable. Adrenals/Urinary Tract: The adrenal glands appear normal. The kidneys enhance with no mass or calculus. On delayed images, the pelvocaliceal systems are unremarkable. Ureters appear normal in caliber. The urinary bladder is not well distended but no abnormality is noted. Stomach/Bowel: The stomach is completely decompressed and cannot be well assessed. No abnormality of small bowel is seen. The colon however is completely decompressed and does appear to be somewhat edematous diffusely. Diffuse colitis is a definite consideration. No focal abscess is seen. The  terminal ileum and the appendix are unremarkable. Vascular/Lymphatic: The abdominal aorta is normal in caliber. No focal aneurysmal dilatation is seen. No adenopathy is noted. Reproductive: The uterus is normal in size. No adnexal lesion seen with probable tiny follicles on the right. No free fluid is seen within the pelvis. Other: No abdominal wall hernia is seen. Musculoskeletal: There degenerative changes involving both SI joints with sclerosis and irregularity. The lumbar vertebrae are in normal alignment with intervertebral disc spaces appear grossly normal. However considerable degenerative change does involve the facet joints of the mid to lower lumbar spine diffusely. IMPRESSION: 1. Although  the colon is completely decompressed, the entire colonic mucosa appears somewhat edematous, suspicious for diffuse colitis. Correlate clinically. No abscess is seen. 2. Degenerative changes throughout the facet joints of the lower lumbar spine and within the SI joints. Electronically Signed   By: Ivar Drape M.D.   On: 10/12/2017 13:25      Assessment/Plan HTN HLD Hx of MI in 2010   Ulcerative Colitis - GI following, s/p flex sig 8/21 - Patient improving on steroids, considering biologic therapy - recommend continuation of current management - no indications for acute surgical intervention at this time, but we will follow pt chart - discussed that if patient were to require emergent surgery that would mean total colectomy with end ileostomy, and if patient were to end up needing surgery non-emergently that would likely mean total colectomy with creation of a J-pouch with temporary ileostomy  FEN: soft diet  VTE: SCDs ID: no current abx  Brigid Re, [facility] Surgery 10/14/2017, 10:31 AM Pager: [phone] Consults: [phone] Mon-Fri 7:00 am-4:30 pm Sat-Sun 7:00 am-11:30 am

## 2017-10-14 NOTE — Progress Notes (Signed)
   10/14/17 1038  Clinical Encounter Type  Visited With Patient  Visit Type Initial  Referral From Nurse;Patient  Consult/Referral To Chaplain   Responded to a [facility] for HCPA.  Patient was welcoming of the visit and stated she is feeling so much better today.  When I asked about the [place] she said she already had one naming her sister.  Patient moved to this area from [place] back in the Spring.  She does have some family support in this area.  She is thankful to have some answers as related to her health and is ready to feel better.  We prayed together.  Will follow and support as needed. Chaplain Katherene Ponto

## 2017-10-14 NOTE — Progress Notes (Signed)
Progress Note   Subjective  Chief Complaint: Ulcerative colitis flare  Today, the patient reports that free she feels much better overnight.  She has had a 50% decrease in the amount of stool she is having and still reports only about 50% of them have any bright red blood in.  She is aware of results of colonoscopy and is hopeful that prednisone and possibly Remicade can salvage her.  We did discuss that surgery will be by today to see her as well.  The patient is hungry today.   Objective   Vital signs in last 24 hours: Temp:  [97.4 F (36.3 C)-98.3 F (36.8 C)] 98.3 F (36.8 C) (08/22 0433) Pulse Rate:  [91-112] 91 (08/22 0433) Resp:  [14-20] 19 (08/22 0433) BP: (105-141)/(59-86) 118/76 (08/22 0433) SpO2:  [92 %-100 %] 95 % (08/22 0433) Weight:  [95.7 kg-97.5 kg] 95.7 kg (08/22 0500) Last BM Date: 10/13/17(per pt) General:  AA female in NAD Heart:  Regular rate and rhythm; no murmurs Lungs: Respirations even and unlabored, lungs CTA bilaterally Abdomen:  Soft, mild generalized ttp and nondistended. Normal bowel sounds. Extremities:  Without edema. Neurologic:  Alert and oriented,  grossly normal neurologically. Psych:  Cooperative. Normal mood and affect.  Intake/Output from previous day: 08/21 0701 - 08/22 0700 In: 4278.9 [P.O.:600; I.V.:3567.8; IV Piggyback:111.1] Out: 1 [Urine:1]  Lab Results: Recent Labs    10/12/17 0900 10/13/17 0640 10/14/17 0600  WBC 12.9* 9.2 8.1  HGB 14.4 11.9* 11.0*  HCT 42.1 35.6* 33.1*  PLT 555* 479* 439*   BMET Recent Labs    10/12/17 0900 10/13/17 0640 10/14/17 0600  NA 135 140 136  K 4.1 4.0 4.5  CL 93* 104 103  CO2 _0 GLUCOSE 119* 115* 124*  BUN _1 CREATININE 1.61* 1.09* 0.83  CALCIUM 9.6 8.7* 8.6*   LFT Recent Labs    10/13/17 0640  PROT 5.5*  ALBUMIN 2.1*  AST 13*  ALT 12  ALKPHOS 90  BILITOT 0.6   PT/INR Recent Labs    10/12/17 0900  LABPROT 12.9  INR 0.98    Studies/Results: Ct  Abdomen Pelvis W Contrast  Result Date: 10/12/2017 CLINICAL DATA:  Nausea and vomiting for 2 weeks, history of ulcerative colitis, patient could not drink oral contrast due to vomiting EXAM: CT ABDOMEN AND PELVIS WITH CONTRAST TECHNIQUE: Multidetector CT imaging of the abdomen and pelvis was performed using the standard protocol following bolus administration of intravenous contrast. CONTRAST:  46m OMNIPAQUE IOHEXOL 300 MG/ML  SOLN COMPARISON:  None. FINDINGS: Lower chest: The lung bases are clear. The heart is within normal limits in size. No pericardial effusion is seen. Hepatobiliary: Tiny subcentimeter low-attenuation structures in the liver most consistent with a benign process such as cysts. No focal hepatic abnormality is seen. The gallbladder is slightly distended but no calcified gallstones are seen. Pancreas: The pancreas is normal in size and the pancreatic duct is not dilated. Spleen: The spleen is unremarkable. Adrenals/Urinary Tract: The adrenal glands appear normal. The kidneys enhance with no mass or calculus. On delayed images, the pelvocaliceal systems are unremarkable. Ureters appear normal in caliber. The urinary bladder is not well distended but no abnormality is noted. Stomach/Bowel: The stomach is completely decompressed and cannot be well assessed. No abnormality of small bowel is seen. The colon however is completely decompressed and does appear to be somewhat edematous diffusely. Diffuse colitis is a definite consideration. No focal abscess is seen. The terminal  ileum and the appendix are unremarkable. Vascular/Lymphatic: The abdominal aorta is normal in caliber. No focal aneurysmal dilatation is seen. No adenopathy is noted. Reproductive: The uterus is normal in size. No adnexal lesion seen with probable tiny follicles on the right. No free fluid is seen within the pelvis. Other: No abdominal wall hernia is seen. Musculoskeletal: There degenerative changes involving both SI joints with  sclerosis and irregularity. The lumbar vertebrae are in normal alignment with intervertebral disc spaces appear grossly normal. However considerable degenerative change does involve the facet joints of the mid to lower lumbar spine diffusely. IMPRESSION: 1. Although the colon is completely decompressed, the entire colonic mucosa appears somewhat edematous, suspicious for diffuse colitis. Correlate clinically. No abscess is seen. 2. Degenerative changes throughout the facet joints of the lower lumbar spine and within the SI joints. Electronically Signed   By: Ivar Drape M.D.   On: 10/12/2017 13:25   Colonoscopy 10/13/2017 Impression:               - Rectal tenderness and non-thrombosed external                            hemorrhoids found on digital rectal exam.                           - Pancolitis ulcerative colitis. Inflammation was                            found from the anus to the transverse colon. This                            was severe. Biopsied. Recommendation:           - The patient will be observed post-procedure,                            until all discharge criteria are met.                           - Return patient to referring hospital for ongoing                            care.                           - Discussed the risks/benefits of steroids (she                            previously has described issues of steroid                            intolerance) and she agrees to use of this in the                            short-term with hope for the long term as well.                           - Start Solumedrol 20 mg Q8H.                           -  I am concerned about the extensiveness of the                            colitis that is present. If we are unable to                            salvage the patient with steroids due to                            intolerance or too significant inflammatory state,                            she may need biologic rescue vs  surgical                            intervention.                           - Will send Biologic/Immunomodulator Initiation                            laboratories as they will likely be needed in the                            near future.                           - Surgical consultation tomorrow would be                            reasonable.                           - The findings and recommendations were discussed                            with the patient.                           - The findings and recommendations were discussed                            with the referring physician.    Assessment / Plan:   Assessment: 1.  Ulcerative colitis flare: Severe inflammation and ulceration found throughout the colon yesterday via colonoscopy, patient initiated on methylprednisolone 20 mg every 8 hours and is doing well overnight with a 50% decrease in symptoms  Plan: 1.  Continue Methylprednisolone 20 mg every 8 hours 2.  We did consult surgery today to come by and see the patient and discuss plans with her if salvage therapy does not work with steroids and likely Remicade. 3.  Increased patient to a soft diet today from her full liquids. 4.  Please await any further recommendations from Dr. Rush Landmark later today.  Thank you for kind consultation, we will continue to follow along.    LOS: 2 days   Levin Erp  10/14/2017, 9:42 AM

## 2017-10-14 NOTE — Progress Notes (Signed)
PROGRESS NOTE    [name]  FFM:[id number] DOB: 1955/07/05 DOA: 10/12/2017 PCP: Patient, No Pcp Per    Brief Narrative:  62 year old female history of hypertension, hyperlipidemia, history of ulcerative colitis, history of MI status post bilateral knee surgery, history of arthritis, history of C. difficile colitis presenting with bloody diarrhea x2 weeks associated with abdominal pain nausea vomiting.  Patient admitted due to concerns for ulcerative colitis flare.  Patient started on prednisone.  GI following.  Patient for colonoscopy today 10/13/2017.   Assessment & Plan:   Principal Problem:   Ulcerative colitis, acute ([place]) Active Problems:   History of myocardial infarction   AKI (acute kidney injury) ([place])   Hyperglycemia   Hyperlipidemia   Thrombocytosis ([facility])   Leukocytosis   Other ulcerative colitis with rectal bleeding ([facility])  #1 bloody diarrhea likely secondary to severe pancolitis ulcerative colitis flare Patient admitted with concerns for ulcerative colitis flare with bloody diarrhea abdominal pain nausea and vomiting.  FOBT done was positive.  C. difficile PCR GI pathogen panel negative.  CT abdomen and pelvis done showed decompressed colon, entire colonic mucosa edematous with suspicion for colitis.  No abscess seen.  Patient seen by [facility] gastroenterologist, Dr. Loletha Carrow III and patient has been started on Anusol.  Continue IV fluids.  Patient underwent flexible sigmoidoscopy on 10/13/2017 that showed severe pancolitis, ulcerative colitis with nonthrombosed external hemorrhoids.  Patient started on IV Solu-Medrol per gastroenterology.  General surgery consulted by GI.  GI following and I appreciate the input and recommendations.   2.  Prior history of MI (presumed type II non-STEMI) In the setting of prior blood loss for bilateral knee arthroplasty.  Denies any chest pain.  Continue current regimen of metoprolol succinate 50 mg nightly.  Follow.  3.  Acute kidney  injury Secondary to prerenal azotemia secondary to GI loss.  Renal function improved with hydration. Follow.  4.  Hyperlipidemia Patient no longer on a statin.  Fasting lipid panel with a LDL of 42, HDL of 39.  Outpatient follow-up.  5.  Leukocytosis In the setting of ulcerative colitis flare.  Likely a reactive leukocytosis.  WBC trending down.  Urinalysis nitrite negative leukocytes negative.  C. difficile PCR negative.  GI pathogen panel negative.  Patient afebrile.  No need for antibiotics at this time.  Follow.  6.  Thrombocytosis Likely reactive secondary to ulcerative colitis flare.  Platelet count at admission was 555.  Platelet count trending down and now at 439.  Follow.  7.  Hyperglycemia Hemoglobin A1c 5.7.  CBG 110 this morning.  Patient on IV steroids.  Outpatient follow-up.   DVT prophylaxis: SCDs Code Status: Full Family Communication: Updated patient.  No family at bedside. Disposition Plan: Home once clinically improved and cleared medically per gastroenterology.   Consultants:   Gastroenterology: Dr. Rush Landmark 10/12/2017  General surgery pending  Procedures:   CT abdomen and pelvis 10/12/2017  Flexible sigmoidoscopy by Dr. Rush Landmark 10/13/2017  Antimicrobials:  None   Subjective: Patient sleeping.  Arousable.  Denies any chest pain no shortness of breath.  States improvement with lower abdominal pain.  No bloody bowel movement this morning.  No nausea or emesis.  Tolerating oral intake.    Objective: Vitals:   10/13/17 1445 10/13/17 1956 10/14/17 0433 10/14/17 0500  BP: 130/86 127/81 118/76   Pulse: (!) 112 98 91   Resp: 20 18 19    Temp: (!) 97.4 F (36.3 C) 98 F (36.7 C) 98.3 F (36.8 C)   TempSrc: Oral  SpO2: 100% 92% 95%   Weight:    95.7 kg  Height:        Intake/Output Summary (Last 24 hours) at 10/14/2017 1113 Last data filed at 10/14/2017 0654 Gross per 24 hour  Intake 4278.93 ml  Output -  Net 4278.93 ml   Filed Weights    10/12/17 0833 10/13/17 1140 10/14/17 0500  Weight: 97.5 kg 97.5 kg 95.7 kg    Examination:  General exam: Appears calm and comfortable  Respiratory system: Lungs clear to auscultation bilaterally.  No wheezes, no crackles, no rhonchi.   Respiratory effort normal. Cardiovascular system: Regular rate and rhythm no murmurs rubs or gallops.  No JVD.  No lower extremity edema.  Gastrointestinal system: Abdomen is soft, nondistended, decreased tenderness to palpation in the lower abdomen.  No rebound.  No guarding.   Central nervous system: ALERT and oriented.  No focal neurological deficits.   Extremities: Symmetric 5 x 5 power. Skin: No rashes, lesions or ulcers Psychiatry: Judgement and insight appear normal. Mood & affect appropriate.     Data Reviewed: I have personally reviewed following labs and imaging studies  CBC: Recent Labs  Lab 10/08/17 1117 10/12/17 0900 10/13/17 0640 10/14/17 0600  WBC 10.1 12.9* 9.2 8.1  NEUTROABS 7.1 9.5 6.3 6.7  HGB 13.0 14.4 11.9* 11.0*  HCT 38.9 42.1 35.6* 33.1*  MCV 90.7 90.1 91.3 91.9  PLT 536.0* 555* 479* 470*   Basic Metabolic Panel: Recent Labs  Lab 10/08/17 1117 10/12/17 0900 10/12/17 1708 10/13/17 0640 10/14/17 0600  NA 136 135  --  140 136  K 3.9 4.1  --  4.0 4.5  CL 98 93*  --  104 103  CO2 30 29  --  26 25  GLUCOSE 121* 119*  --  115* 124*  BUN 17 15  --  13 12  CREATININE 1.55* 1.61*  --  1.09* 0.83  CALCIUM 9.7 9.6  --  8.7* 8.6*  MG  --   --  1.8 1.7 2.1  PHOS  --   --  3.8 3.1  --    GFR: Estimated Creatinine Clearance: 83.4 mL/min (by C-G formula based on SCr of 0.83 mg/dL). Liver Function Tests: Recent Labs  Lab 10/12/17 0900 10/13/17 0640  AST 16 13*  ALT 14 12  ALKPHOS 100 90  BILITOT 0.7 0.6  PROT 6.6 5.5*  ALBUMIN 2.7* 2.1*   Recent Labs  Lab 10/12/17 0900  LIPASE 22   No results for input(s): AMMONIA in the last 168 hours. Coagulation Profile: Recent Labs  Lab 10/12/17 0900  INR 0.98    Cardiac Enzymes: No results for input(s): CKTOTAL, CKMB, CKMBINDEX, TROPONINI in the last 168 hours. BNP (last 3 results) No results for input(s): PROBNP in the last 8760 hours. HbA1C: Recent Labs    10/13/17 0640  HGBA1C 5.7*   CBG: Recent Labs  Lab 10/13/17 0715 10/14/17 0741  GLUCAP 108* 110*   Lipid Profile: Recent Labs    10/13/17 0640  CHOL 95  HDL 39*  LDLCALC 42  TRIG 71  CHOLHDL 2.4   Thyroid Function Tests: Recent Labs    10/13/17 0640  TSH 0.942   Anemia Panel: No results for input(s): VITAMINB12, FOLATE, FERRITIN, TIBC, IRON, RETICCTPCT in the last 72 hours. Sepsis Labs: Recent Labs  Lab 10/12/17 1033  LATICACIDVEN 1.82    Recent Results (from the past 240 hour(s))  Gastrointestinal Panel by PCR , Stool     Status: None   Collection  Time: 10/12/17  9:04 AM  Result Value Ref Range Status   Campylobacter species NOT DETECTED NOT DETECTED Final   Plesimonas shigelloides NOT DETECTED NOT DETECTED Final   Salmonella species NOT DETECTED NOT DETECTED Final   Yersinia enterocolitica NOT DETECTED NOT DETECTED Final   Vibrio species NOT DETECTED NOT DETECTED Final   Vibrio cholerae NOT DETECTED NOT DETECTED Final   Enteroaggregative E coli (EAEC) NOT DETECTED NOT DETECTED Final   Enteropathogenic E coli (EPEC) NOT DETECTED NOT DETECTED Final   Enterotoxigenic E coli (ETEC) NOT DETECTED NOT DETECTED Final   Shiga like toxin producing E coli (STEC) NOT DETECTED NOT DETECTED Final   Shigella/Enteroinvasive E coli (EIEC) NOT DETECTED NOT DETECTED Final   Cryptosporidium NOT DETECTED NOT DETECTED Final   Cyclospora cayetanensis NOT DETECTED NOT DETECTED Final   Entamoeba histolytica NOT DETECTED NOT DETECTED Final   Giardia lamblia NOT DETECTED NOT DETECTED Final   Adenovirus F40/41 NOT DETECTED NOT DETECTED Final   Astrovirus NOT DETECTED NOT DETECTED Final   Norovirus GI/GII NOT DETECTED NOT DETECTED Final   Rotavirus A NOT DETECTED NOT DETECTED  Final   Sapovirus (I, II, IV, and V) NOT DETECTED NOT DETECTED Final    Comment: Performed at [facility], [address] [place], [place] [address]  C difficile quick scan w PCR reflex     Status: None   Collection Time: 10/12/17  9:04 AM  Result Value Ref Range Status   C Diff antigen NEGATIVE NEGATIVE Final   C Diff toxin NEGATIVE NEGATIVE Final   C Diff interpretation No C. difficile detected.  Final    Comment: Performed at [facility], [place] [address], [place], [place] [address]  Calprotectin, Fecal     Status: Abnormal   Collection Time: 10/12/17  3:37 PM  Result Value Ref Range Status   Calprotectin, Fecal 1,036 (H) 0 - 120 ug/g Final    Comment: (NOTE) Concentration     Interpretation   Follow-Up <16 - 50 ug/g     Normal           None >50 -120 ug/g     Borderline       Re-evaluate in 4-6 weeks    >120 ug/g     Abnormal         Repeat as clinically                                   indicated Performed At: [facility] [place], [place] [id number] Rush Farmer MD WU:[id number]          Radiology Studies: Ct Abdomen Pelvis W Contrast  Result Date: 10/12/2017 CLINICAL DATA:  Nausea and vomiting for 2 weeks, history of ulcerative colitis, patient could not drink oral contrast due to vomiting EXAM: CT ABDOMEN AND PELVIS WITH CONTRAST TECHNIQUE: Multidetector CT imaging of the abdomen and pelvis was performed using the standard protocol following bolus administration of intravenous contrast. CONTRAST:  15mL OMNIPAQUE IOHEXOL 300 MG/ML  SOLN COMPARISON:  None. FINDINGS: Lower chest: The lung bases are clear. The heart is within normal limits in size. No pericardial effusion is seen. Hepatobiliary: Tiny subcentimeter low-attenuation structures in the liver most consistent with a benign process such as cysts. No focal hepatic abnormality is seen. The gallbladder is slightly distended but no calcified gallstones are seen.  Pancreas: The pancreas is normal in size and the pancreatic  duct is not dilated. Spleen: The spleen is unremarkable. Adrenals/Urinary Tract: The adrenal glands appear normal. The kidneys enhance with no mass or calculus. On delayed images, the pelvocaliceal systems are unremarkable. Ureters appear normal in caliber. The urinary bladder is not well distended but no abnormality is noted. Stomach/Bowel: The stomach is completely decompressed and cannot be well assessed. No abnormality of small bowel is seen. The colon however is completely decompressed and does appear to be somewhat edematous diffusely. Diffuse colitis is a definite consideration. No focal abscess is seen. The terminal ileum and the appendix are unremarkable. Vascular/Lymphatic: The abdominal aorta is normal in caliber. No focal aneurysmal dilatation is seen. No adenopathy is noted. Reproductive: The uterus is normal in size. No adnexal lesion seen with probable tiny follicles on the right. No free fluid is seen within the pelvis. Other: No abdominal wall hernia is seen. Musculoskeletal: There degenerative changes involving both SI joints with sclerosis and irregularity. The lumbar vertebrae are in normal alignment with intervertebral disc spaces appear grossly normal. However considerable degenerative change does involve the facet joints of the mid to lower lumbar spine diffusely. IMPRESSION: 1. Although the colon is completely decompressed, the entire colonic mucosa appears somewhat edematous, suspicious for diffuse colitis. Correlate clinically. No abscess is seen. 2. Degenerative changes throughout the facet joints of the lower lumbar spine and within the SI joints. Electronically Signed   By: Ivar Drape M.D.   On: 10/12/2017 13:25        Scheduled Meds: . hydrocortisone  25 mg Rectal BID  . methylPREDNISolone (SOLU-MEDROL) injection  20 mg Intravenous Q8H  . metoprolol succinate  50 mg Oral Daily  . multivitamin with minerals  1 tablet  Oral Daily   Continuous Infusions: . sodium chloride 125 mL/hr at 10/13/17 2238  . sodium chloride Stopped (10/12/17 1112)     LOS: 2 days    Time spent: 35 minutes    Irine Seal, MD [facility] Pager [phone]  If 7PM-7AM, please contact night-coverage [email or web address] Password [email or web address] 10/14/2017, 11:13 AM

## 2017-10-14 NOTE — Care Management Note (Signed)
Case Management Note  Patient Details  Name: [name] MRN: [id number] Date of Birth: 07-03-1955  Subjective/Objective:                  flex-sig yesterday and was started on steroids, since this she is feeling much better. Still having bloody stools but reports decrease in amount. Hopeful that she does not require surgery.   Action/Plan: Following for poss. surgerical intervention  Expected Discharge Date:  (unknown)               Expected Discharge Plan:  Home/Self Care  In-House Referral:     Discharge planning Services  CM Consult  Post Acute Care Choice:    Choice offered to:     DME Arranged:    DME Agency:     HH Arranged:    HH Agency:     Status of Service:  In process, will continue to follow  If discussed at Long Length of Stay Meetings, dates discussed:    Additional Comments:  Leeroy Cha, RN 10/14/2017, 12:41 PM

## 2017-10-15 DIAGNOSIS — R1084 Generalized abdominal pain: Secondary | ICD-10-CM

## 2017-10-15 DIAGNOSIS — R933 Abnormal findings on diagnostic imaging of other parts of digestive tract: Secondary | ICD-10-CM

## 2017-10-15 DIAGNOSIS — K529 Noninfective gastroenteritis and colitis, unspecified: Secondary | ICD-10-CM

## 2017-10-15 LAB — CBC WITH DIFFERENTIAL/PLATELET
BASOS ABS: 0 10*3/uL (ref 0.0–0.1)
Basophils Relative: 0 %
Eosinophils Absolute: 0 10*3/uL (ref 0.0–0.7)
Eosinophils Relative: 0 %
HEMATOCRIT: 35.5 % — AB (ref 36.0–46.0)
Hemoglobin: 11.6 g/dL — ABNORMAL LOW (ref 12.0–15.0)
LYMPHS ABS: 1.3 10*3/uL (ref 0.7–4.0)
LYMPHS PCT: 14 %
MCH: 30 pg (ref 26.0–34.0)
MCHC: 32.7 g/dL (ref 30.0–36.0)
MCV: 91.7 fL (ref 78.0–100.0)
MONOS PCT: 14 %
Monocytes Absolute: 1.3 10*3/uL — ABNORMAL HIGH (ref 0.1–1.0)
NEUTROS PCT: 72 %
Neutro Abs: 6.5 10*3/uL (ref 1.7–7.7)
Platelets: 566 10*3/uL — ABNORMAL HIGH (ref 150–400)
RBC: 3.87 MIL/uL (ref 3.87–5.11)
RDW: 14.3 % (ref 11.5–15.5)
WBC: 9.1 10*3/uL (ref 4.0–10.5)

## 2017-10-15 LAB — BASIC METABOLIC PANEL
ANION GAP: 8 (ref 5–15)
BUN: 13 mg/dL (ref 8–23)
CALCIUM: 9.3 mg/dL (ref 8.9–10.3)
CO2: 26 mmol/L (ref 22–32)
Chloride: 105 mmol/L (ref 98–111)
Creatinine, Ser: 0.9 mg/dL (ref 0.44–1.00)
GFR calc non Af Amer: >60 mL/min (ref >60)
Glucose, Bld: 130 mg/dL — ABNORMAL HIGH (ref 70–99)
POTASSIUM: 4.3 mmol/L (ref 3.5–5.1)
Sodium: 139 mmol/L (ref 135–145)

## 2017-10-15 LAB — HCV COMMENT:

## 2017-10-15 LAB — HEPATITIS B SURFACE ANTIBODY, QUANTITATIVE: Hepatitis B-Post: 174.3 m[IU]/mL (ref >9.9)

## 2017-10-15 LAB — GLUCOSE, CAPILLARY: GLUCOSE-CAPILLARY: 118 mg/dL — AB (ref 70–99)

## 2017-10-15 LAB — HEPATITIS C ANTIBODY (REFLEX): HCV Ab: <0.1 s/co ratio (ref 0.0–0.9)

## 2017-10-15 LAB — HEPATITIS B CORE ANTIBODY, TOTAL: HEP B C TOTAL AB: NEGATIVE

## 2017-10-15 LAB — MAGNESIUM: Magnesium: 2 mg/dL (ref 1.7–2.4)

## 2017-10-15 LAB — VITAMIN D 25 HYDROXY (VIT D DEFICIENCY, FRACTURES): VIT D 25 HYDROXY: 28.2 ng/mL — AB (ref 30.0–100.0)

## 2017-10-15 NOTE — Progress Notes (Signed)
2 Days Post-Op    CC:  Bloody diarrhea  Subjective: She says she feels much better.    Objective: Vital signs in last 24 hours: Temp:  [98 F (36.7 C)-98.4 F (36.9 C)] 98 F (36.7 C) (08/23 0549) Pulse Rate:  [92-100] 100 (08/23 0549) Resp:  [16-18] 18 (08/23 0549) BP: (115-133)/(76-84) 115/84 (08/23 0549) SpO2:  [95 %-97 %] 97 % (08/23 0549) Weight:  [95.3 kg] 95.3 kg (08/23 0549) Last BM Date: 10/14/17 IV:  751 Urine x 4  stools x 5 Afebrile, VSS Labs OK   Intake/Output from previous day: 08/22 0701 - 08/23 0700 In: 751.8 [I.V.:751.8] Out: -  Intake/Output this shift: No intake/output data recorded.  General appearance: alert, cooperative and no distress GI: soft, non-tender; bowel sounds normal; no masses,  no organomegaly  Lab Results:  Recent Labs    10/14/17 0600 10/15/17 0555  WBC 8.1 9.1  HGB 11.0* 11.6*  HCT 33.1* 35.5*  PLT 439* 566*    BMET Recent Labs    10/14/17 0600 10/15/17 0555  NA 136 139  K 4.5 4.3  CL 103 105  CO2 25 26  GLUCOSE 124* 130*  BUN 12 13  CREATININE 0.83 0.90  CALCIUM 8.6* 9.3   PT/INR Recent Labs    10/12/17 0900  LABPROT 12.9  INR 0.98    Recent Labs  Lab 10/12/17 0900 10/13/17 0640  AST 16 13*  ALT 14 12  ALKPHOS 100 90  BILITOT 0.7 0.6  PROT 6.6 5.5*  ALBUMIN 2.7* 2.1*     Lipase     Component Value Date/Time   LIPASE 22 10/12/2017 0900     Medications: . hydrocortisone  25 mg Rectal BID  . methylPREDNISolone (SOLU-MEDROL) injection  20 mg Intravenous Q8H  . metoprolol succinate  50 mg Oral Daily  . multivitamin with minerals  1 tablet Oral Daily   . sodium chloride 75 mL/hr at 10/15/17 0300  . sodium chloride Stopped (10/12/17 1112)    Assessment/Plan Prior hx of NSTEMI AKI - improved  Ulcerative colitis -   - day 2 Steroids  FEN:  IV fluids/soft diet ID:  None DVT:  SCD - bloody diarrhea - no anticoagulants Follow up:  GI   Plan:  We will be available as needed.      LOS: 3 days    Lassie Demorest 10/15/2017 [phone]

## 2017-10-15 NOTE — Progress Notes (Signed)
PROGRESS NOTE    [name]  QIO:[id number] DOB: 1955-12-23 DOA: 10/12/2017 PCP: Patient, No Pcp Per    Brief Narrative:  62 year old female history of hypertension, hyperlipidemia, history of ulcerative colitis, history of MI status post bilateral knee surgery, history of arthritis, history of C. difficile colitis presenting with bloody diarrhea x2 weeks associated with abdominal pain nausea vomiting.  Patient admitted due to concerns for ulcerative colitis flare.  Patient started on prednisone.  GI following.  Patient for colonoscopy today 10/13/2017.   Assessment & Plan:   Principal Problem:   Ulcerative colitis, acute ([place]) Active Problems:   History of myocardial infarction   AKI (acute kidney injury) ([place])   Hyperglycemia   Hyperlipidemia   Thrombocytosis (HCC)   Leukocytosis   Other ulcerative colitis with rectal bleeding (HCC)   Dehydration  #1 bloody diarrhea likely secondary to severe pancolitis ulcerative colitis flare Patient admitted with concerns for ulcerative colitis flare with bloody diarrhea abdominal pain nausea and vomiting.  FOBT done was positive.  C. difficile PCR GI pathogen panel negative.  CT abdomen and pelvis done showed decompressed colon, entire colonic mucosa edematous with suspicion for colitis.  No abscess seen.  Patient seen by [facility] gastroenterologist, Dr. Loletha Carrow III and patient has been started on Anusol.  Continue IV fluids.  Patient underwent flexible sigmoidoscopy on 10/13/2017 that showed severe pancolitis, ulcerative colitis with nonthrombosed external hemorrhoids.  Patient started on IV Solu-Medrol per gastroenterology.  Continue IV steroids as well as hydrocortisone suppositories.  Per GI.    2.  Prior history of MI (presumed type II non-STEMI) In the setting of prior blood loss for bilateral knee arthroplasty.  Denies any chest pain.  Continue current regimen of metoprolol succinate 50 mg nightly.  Follow.  3.  Acute kidney injury Secondary  to prerenal azotemia secondary to GI loss.  Renal function improved with hydration. Follow.  4.  Hyperlipidemia Patient no longer on a statin.  Fasting lipid panel with a LDL of 42, HDL of 39.  Outpatient follow-up.  5.  Leukocytosis In the setting of ulcerative colitis flare.  Likely a reactive leukocytosis.  WBC trending down.  Urinalysis nitrite negative leukocytes negative.  C. difficile PCR negative.  GI pathogen panel negative.  Patient afebrile.  No need for antibiotics at this time.  Follow.  6.  Thrombocytosis Likely reactive secondary to ulcerative colitis flare.  Platelet count at admission was 555.  Platelet count trending back up and now at 566. Follow.  7.  Hyperglycemia Hemoglobin A1c 5.7.  CBG 118 this morning.  Patient on IV steroids.  Outpatient follow-up.   DVT prophylaxis: SCDs Code Status: Full Family Communication: Updated patient.  No family at bedside. Disposition Plan: Home once clinically improved and cleared medically per gastroenterology.   Consultants:   Gastroenterology: Dr. Rush Landmark 10/12/2017  General surgery pending  Procedures:   CT abdomen and pelvis 10/12/2017  Flexible sigmoidoscopy by Dr. Rush Landmark 10/13/2017  Antimicrobials:  None   Subjective: Patient sleeping however easily arousable.  Patient states she is a night owl and is usually up most of the night.  States lower abdominal pain improving.  No nausea or emesis.  Tolerating current diet.    Objective: Vitals:   10/14/17 0500 10/14/17 1527 10/14/17 2018 10/15/17 0549  BP:  129/76 133/77 115/84  Pulse:  93 92 100  Resp:  16 16 18   Temp:  98.2 F (36.8 C) 98.4 F (36.9 C) 98 F (36.7 C)  TempSrc:  Oral  Oral  SpO2:  96% 95% 97%  Weight: 95.7 kg   95.3 kg  Height:        Intake/Output Summary (Last 24 hours) at 10/15/2017 1114 Last data filed at 10/15/2017 0300 Gross per 24 hour  Intake 751.8 ml  Output -  Net 751.8 ml   Filed Weights   10/13/17 1140 10/14/17 0500  10/15/17 0549  Weight: 97.5 kg 95.7 kg 95.3 kg    Examination:  General exam: Sleeping.  Easily arousable.   Respiratory system: CTAB.  No wheezes, no crackles, no rhonchi.  Respiratory effort normal. Cardiovascular system: RRR no murmurs rubs or gallops.  No JVD.  No lower extremity edema.   Gastrointestinal system: Abdomen is nondistended, soft, decreased tenderness to palpation in the lower abdomen.  No rebound.  No guarding.    Central nervous system: ALERT and oriented.  No focal neurological deficits.   Extremities: Symmetric 5 x 5 power. Skin: No rashes, lesions or ulcers Psychiatry: Judgement and insight appear normal. Mood & affect appropriate.     Data Reviewed: I have personally reviewed following labs and imaging studies  CBC: Recent Labs  Lab 10/08/17 1117 10/12/17 0900 10/13/17 0640 10/14/17 0600 10/15/17 0555  WBC 10.1 12.9* 9.2 8.1 9.1  NEUTROABS 7.1 9.5 6.3 6.7 6.5  HGB 13.0 14.4 11.9* 11.0* 11.6*  HCT 38.9 42.1 35.6* 33.1* 35.5*  MCV 90.7 90.1 91.3 91.9 91.7  PLT 536.0* 555* 479* 439* 606*   Basic Metabolic Panel: Recent Labs  Lab 10/08/17 1117 10/12/17 0900 10/12/17 1708 10/13/17 0640 10/14/17 0600 10/15/17 0555  NA 136 135  --  140 136 139  K 3.9 4.1  --  4.0 4.5 4.3  CL 98 93*  --  104 103 105  CO2 30 29  --  26 25 26   GLUCOSE 121* 119*  --  115* 124* 130*  BUN 17 15  --  13 12 13   CREATININE 1.55* 1.61*  --  1.09* 0.83 0.90  CALCIUM 9.7 9.6  --  8.7* 8.6* 9.3  MG  --   --  1.8 1.7 2.1 2.0  PHOS  --   --  3.8 3.1  --   --    GFR: Estimated Creatinine Clearance: 76.8 mL/min (by C-G formula based on SCr of 0.9 mg/dL). Liver Function Tests: Recent Labs  Lab 10/12/17 0900 10/13/17 0640  AST 16 13*  ALT 14 12  ALKPHOS 100 90  BILITOT 0.7 0.6  PROT 6.6 5.5*  ALBUMIN 2.7* 2.1*   Recent Labs  Lab 10/12/17 0900  LIPASE 22   No results for input(s): AMMONIA in the last 168 hours. Coagulation Profile: Recent Labs  Lab  10/12/17 0900  INR 0.98   Cardiac Enzymes: No results for input(s): CKTOTAL, CKMB, CKMBINDEX, TROPONINI in the last 168 hours. BNP (last 3 results) No results for input(s): PROBNP in the last 8760 hours. HbA1C: Recent Labs    10/13/17 0640  HGBA1C 5.7*   CBG: Recent Labs  Lab 10/13/17 0715 10/14/17 0741 10/15/17 0730  GLUCAP 108* 110* 118*   Lipid Profile: Recent Labs    10/13/17 0640  CHOL 95  HDL 39*  LDLCALC 42  TRIG 71  CHOLHDL 2.4   Thyroid Function Tests: Recent Labs    10/13/17 0640  TSH 0.942   Anemia Panel: No results for input(s): VITAMINB12, FOLATE, FERRITIN, TIBC, IRON, RETICCTPCT in the last 72 hours. Sepsis Labs: Recent Labs  Lab 10/12/17 1033  LATICACIDVEN 1.82    Recent Results (  from the past 240 hour(s))  Gastrointestinal Panel by PCR , Stool     Status: None   Collection Time: 10/12/17  9:04 AM  Result Value Ref Range Status   Campylobacter species NOT DETECTED NOT DETECTED Final   Plesimonas shigelloides NOT DETECTED NOT DETECTED Final   Salmonella species NOT DETECTED NOT DETECTED Final   Yersinia enterocolitica NOT DETECTED NOT DETECTED Final   Vibrio species NOT DETECTED NOT DETECTED Final   Vibrio cholerae NOT DETECTED NOT DETECTED Final   Enteroaggregative E coli (EAEC) NOT DETECTED NOT DETECTED Final   Enteropathogenic E coli (EPEC) NOT DETECTED NOT DETECTED Final   Enterotoxigenic E coli (ETEC) NOT DETECTED NOT DETECTED Final   Shiga like toxin producing E coli (STEC) NOT DETECTED NOT DETECTED Final   Shigella/Enteroinvasive E coli (EIEC) NOT DETECTED NOT DETECTED Final   Cryptosporidium NOT DETECTED NOT DETECTED Final   Cyclospora cayetanensis NOT DETECTED NOT DETECTED Final   Entamoeba histolytica NOT DETECTED NOT DETECTED Final   Giardia lamblia NOT DETECTED NOT DETECTED Final   Adenovirus F40/41 NOT DETECTED NOT DETECTED Final   Astrovirus NOT DETECTED NOT DETECTED Final   Norovirus GI/GII NOT DETECTED NOT DETECTED  Final   Rotavirus A NOT DETECTED NOT DETECTED Final   Sapovirus (I, II, IV, and V) NOT DETECTED NOT DETECTED Final    Comment: Performed at [facility], [address] [place], [place] [address]  C difficile quick scan w PCR reflex     Status: None   Collection Time: 10/12/17  9:04 AM  Result Value Ref Range Status   C Diff antigen NEGATIVE NEGATIVE Final   C Diff toxin NEGATIVE NEGATIVE Final   C Diff interpretation No C. difficile detected.  Final    Comment: Performed at [facility], [place] [address], [place], [place] [address]  Calprotectin, Fecal     Status: Abnormal   Collection Time: 10/12/17  3:37 PM  Result Value Ref Range Status   Calprotectin, Fecal 1,036 (H) 0 - 120 ug/g Final    Comment: (NOTE) Concentration     Interpretation   Follow-Up <16 - 50 ug/g     Normal           None >50 -120 ug/g     Borderline       Re-evaluate in 4-6 weeks    >120 ug/g     Abnormal         Repeat as clinically                                   indicated Performed At: [facility] [place], [place] [id number] Rush Farmer MD NA:[id number]          Radiology Studies: No results found.      Scheduled Meds: . hydrocortisone  25 mg Rectal BID  . methylPREDNISolone (SOLU-MEDROL) injection  20 mg Intravenous Q8H  . metoprolol succinate  50 mg Oral Daily  . multivitamin with minerals  1 tablet Oral Daily   Continuous Infusions: . sodium chloride 75 mL/hr at 10/15/17 0300  . sodium chloride Stopped (10/12/17 1112)     LOS: 3 days    Time spent: 35 minutes    Irine Seal, MD [facility] Pager [phone]  If 7PM-7AM, please contact night-coverage [email or web address] Password [facility] 10/15/2017, 11:14 AM

## 2017-10-15 NOTE — Progress Notes (Signed)
    Progress Note   Subjective  Chief Complaint: Ulcerative colitis flare  Today, patient continues to feel much improved, she had 8 total stools yesterday though some of them are just "a little bit of bright red blood".  Overall she feels her symptoms are much better.  She has been able to tolerate her diet.   Objective   Vital signs in last 24 hours: Temp:  [98 F (36.7 C)-98.4 F (36.9 C)] 98 F (36.7 C) (08/23 0549) Pulse Rate:  [92-100] 100 (08/23 0549) Resp:  [16-18] 18 (08/23 0549) BP: (115-133)/(76-84) 115/84 (08/23 0549) SpO2:  [95 %-97 %] 97 % (08/23 0549) Weight:  [95.3 kg] 95.3 kg (08/23 0549) Last BM Date: 10/14/17 General:    white female in NAD Heart:  Regular rate and rhythm; no murmurs Lungs: Respirations even and unlabored, lungs CTA bilaterally Abdomen:  Soft, mild generalized TTP and nondistended. Normal bowel sounds. Extremities:  Without edema. Neurologic:  Alert and oriented,  grossly normal neurologically. Psych:  Cooperative. Normal mood and affect.  Intake/Output from previous day: 08/22 0701 - 08/23 0700 In: 751.8 [I.V.:751.8] Out: -   Lab Results: Recent Labs    10/13/17 0640 10/14/17 0600 10/15/17 0555  WBC 9.2 8.1 9.1  HGB 11.9* 11.0* 11.6*  HCT 35.6* 33.1* 35.5*  PLT 479* 439* 566*   BMET Recent Labs    10/13/17 0640 10/14/17 0600 10/15/17 0555  NA 140 136 139  K 4.0 4.5 4.3  CL 104 103 105  CO2 26 25 26   GLUCOSE 115* 124* 130*  BUN 13 12 13   CREATININE 1.09* 0.83 0.90  CALCIUM 8.7* 8.6* 9.3   LFT Recent Labs    10/13/17 0640  PROT 5.5*  ALBUMIN 2.1*  AST 13*  ALT 12  ALKPHOS 90  BILITOT 0.6    Assessment / Plan:   Assessment: 1.  Ulcerative colitis flare: Severe inflammation and ulceration found throughout the colon via colonoscopy 10/13/2017, pathology showing mild to severely active chronic colitis with foci of ulceration, consistent with history of ulcerative colitis negative for granulomas or dysplasia;  Methylprednisolone 20 mg every 8 hours was initiated on 10/13/2017, currently day #2 steroids with a continued decrease in symptoms  Plan: 1.  Continue Methylprednisolone 20 mg every 8 hours 2.  Please await any further recommendations Dr. Rush Landmark later today  Thank you for your kind consultation, we will continue to follow along.   LOS: 3 days   [name]  10/15/2017, 10:55 AM

## 2017-10-15 NOTE — Care Management Note (Signed)
Case Management Note  Patient Details  Name: [name] MRN: [id number] Date of Birth: 10/09/1955  Subpatient continues to feel much improved, she had 8 total stools yesterday though some of them are just "a little bit of bright red blood".  Overall she feels her symptoms are much better.  She has been able to tolerate her diet.jective/Objective:                    Action/Plan: Following for progression and cm needs  Expected Discharge Date:  (unknown)               Expected Discharge Plan:  Home/Self Care  In-House Referral:     Discharge planning Services  CM Consult  Post Acute Care Choice:    Choice offered to:     DME Arranged:    DME Agency:     HH Arranged:    HH Agency:     Status of Service:  In process, will continue to follow  If discussed at Long Length of Stay Meetings, dates discussed:    Additional Comments:  Leeroy Cha, RN 10/15/2017, 3:43 PM

## 2017-10-16 DIAGNOSIS — K625 Hemorrhage of anus and rectum: Secondary | ICD-10-CM

## 2017-10-16 DIAGNOSIS — R197 Diarrhea, unspecified: Secondary | ICD-10-CM

## 2017-10-16 LAB — CBC WITH DIFFERENTIAL/PLATELET
BASOS ABS: 0 10*3/uL (ref 0.0–0.1)
Basophils Relative: 0 %
EOS ABS: 0 10*3/uL (ref 0.0–0.7)
Eosinophils Relative: 0 %
HCT: 32 % — ABNORMAL LOW (ref 36.0–46.0)
HEMOGLOBIN: 10.4 g/dL — AB (ref 12.0–15.0)
LYMPHS PCT: 14 %
Lymphs Abs: 1.2 10*3/uL (ref 0.7–4.0)
MCH: 30 pg (ref 26.0–34.0)
MCHC: 32.5 g/dL (ref 30.0–36.0)
MCV: 92.2 fL (ref 78.0–100.0)
MONOS PCT: 17 %
Monocytes Absolute: 1.5 10*3/uL — ABNORMAL HIGH (ref 0.1–1.0)
NEUTROS ABS: 6.1 10*3/uL (ref 1.7–7.7)
NEUTROS PCT: 69 %
PLATELETS: 467 10*3/uL — AB (ref 150–400)
RBC: 3.47 MIL/uL — AB (ref 3.87–5.11)
RDW: 14.5 % (ref 11.5–15.5)
WBC: 8.8 10*3/uL (ref 4.0–10.5)

## 2017-10-16 LAB — BASIC METABOLIC PANEL
Anion gap: 7 (ref 5–15)
BUN: 12 mg/dL (ref 8–23)
CO2: 27 mmol/L (ref 22–32)
CREATININE: 0.8 mg/dL (ref 0.44–1.00)
Calcium: 9.1 mg/dL (ref 8.9–10.3)
Chloride: 107 mmol/L (ref 98–111)
GFR calc non Af Amer: >60 mL/min (ref >60)
Glucose, Bld: 120 mg/dL — ABNORMAL HIGH (ref 70–99)
POTASSIUM: 4.1 mmol/L (ref 3.5–5.1)
SODIUM: 141 mmol/L (ref 135–145)

## 2017-10-16 LAB — GLUCOSE, CAPILLARY: GLUCOSE-CAPILLARY: 93 mg/dL (ref 70–99)

## 2017-10-16 MED ORDER — PREDNISONE 20 MG PO TABS
40.0000 mg | ORAL_TABLET | Freq: Every day | ORAL | Status: DC
  Administered 2017-10-17 – 2017-10-18 (×2): 40 mg via ORAL
  Filled 2017-10-16 (×2): qty 2

## 2017-10-16 NOTE — Progress Notes (Signed)
PROGRESS NOTE    [name]  VOJ:[id number] DOB: 06-May-1955 DOA: 10/12/2017 PCP: Patient, No Pcp Per    Brief Narrative:  62 year old female history of hypertension, hyperlipidemia, history of ulcerative colitis, history of MI status post bilateral knee surgery, history of arthritis, history of C. difficile colitis presenting with bloody diarrhea x2 weeks associated with abdominal pain nausea vomiting.  Patient admitted due to concerns for ulcerative colitis flare.  Patient started on prednisone.  GI following.  Patient for colonoscopy today 10/13/2017.   Assessment & Plan:   Principal Problem:   Ulcerative colitis, acute ([place]) Active Problems:   History of myocardial infarction   AKI (acute kidney injury) ([place])   Hyperglycemia   Hyperlipidemia   Thrombocytosis (HCC)   Leukocytosis   Other ulcerative colitis with rectal bleeding (HCC)   Dehydration  #1 bloody diarrhea likely secondary to severe pancolitis ulcerative colitis flare Patient admitted with concerns for ulcerative colitis flare with bloody diarrhea abdominal pain nausea and vomiting.  FOBT done was positive.  C. difficile PCR GI pathogen panel negative.  CT abdomen and pelvis done showed decompressed colon, entire colonic mucosa edematous with suspicion for colitis.  No abscess seen.  Patient seen by [facility] gastroenterologist, Dr. Loletha Carrow III and patient has been started on Anusol.  Continue IV fluids.  Patient underwent flexible sigmoidoscopy on 10/13/2017 that showed severe pancolitis, ulcerative colitis with nonthrombosed external hemorrhoids.  Patient started on IV Solu-Medrol per gastroenterology.  Continue IV steroids as well as hydrocortisone suppositories.  Per GI.    2.  Prior history of MI (presumed type II non-STEMI) In the setting of prior blood loss for bilateral knee arthroplasty.  Denies any chest pain.  Continue current regimen of metoprolol succinate 50 mg nightly.  Follow.  3.  Acute kidney injury Secondary  to prerenal azotemia secondary to GI loss.  Renal function improved with hydration. Follow.  4.  Hyperlipidemia Patient no longer on a statin.  Fasting lipid panel with a LDL of 42, HDL of 39.  Outpatient follow-up.  5.  Leukocytosis In the setting of ulcerative colitis flare.  Likely a reactive leukocytosis.  WBC trending down.  Urinalysis nitrite negative leukocytes negative.  C. difficile PCR negative.  GI pathogen panel negative.  Patient afebrile.  No need for antibiotics at this time.  Follow.  6.  Thrombocytosis Likely reactive secondary to ulcerative colitis flare.  Platelet count at admission was 555.  Platelet count trending back down and now at 467 from 566. Follow.  7.  Hyperglycemia Hemoglobin A1c 5.7.  CBG 93 this morning.  Patient on IV steroids.  Outpatient follow-up.   DVT prophylaxis: SCDs Code Status: Full Family Communication: Updated patient.  No family at bedside. Disposition Plan: Home once clinically improved and cleared medically per gastroenterology.   Consultants:   Gastroenterology: Dr. Rush Landmark 10/12/2017  General surgery: Dr. Windle Guard 10/15/2017  Procedures:   CT abdomen and pelvis 10/12/2017  Flexible sigmoidoscopy by Dr. Rush Landmark 10/13/2017  Antimicrobials:  None   Subjective: Patient laying in bed alert.  Denies any nausea or emesis.  No chest pain.  No shortness of breath.  States still with some lower abdominal pain however slowly improving and responding to pain medication.  Still with some blood in her stool however amount of blood decreasing.    Objective: Vitals:   10/15/17 2031 10/16/17 0459 10/16/17 0700 10/16/17 1456  BP: 137/75 133/83  127/70  Pulse: 68 65  68  Resp: 20 18  18   Temp: 98.6 F (  37 C) 98.1 F (36.7 C)  98.6 F (37 C)  TempSrc: Oral Oral    SpO2: 96% 95%  97%  Weight:  98.9 kg 98.5 kg   Height:        Intake/Output Summary (Last 24 hours) at 10/16/2017 1622 Last data filed at 10/16/2017 0000 Gross per 24  hour  Intake 375 ml  Output -  Net 375 ml   Filed Weights   10/15/17 0549 10/16/17 0459 10/16/17 0700  Weight: 95.3 kg 98.9 kg 98.5 kg    Examination:  General exam: Awake.   Respiratory system: Lungs are to auscultation bilaterally.  No wheezes, no crackles, no rhonchi.  Normal respiratory effort.   Cardiovascular system: Regular rate and rhythm no murmurs rubs or gallops.  No JVD.  No lower extremity edema. Gastrointestinal system: Abdomen is soft, tenderness to palpation in the lower abdomen, nondistended, positive bowel sounds.  No rebound.  No guarding.     Central nervous system: Alert and oriented.  No focal neurological deficits.   Extremities: Symmetric 5 x 5 power. Skin: No rashes, lesions or ulcers Psychiatry: Judgement and insight appear normal. Mood & affect appropriate.     Data Reviewed: I have personally reviewed following labs and imaging studies  CBC: Recent Labs  Lab 10/12/17 0900 10/13/17 0640 10/14/17 0600 10/15/17 0555 10/16/17 0551  WBC 12.9* 9.2 8.1 9.1 8.8  NEUTROABS 9.5 6.3 6.7 6.5 6.1  HGB 14.4 11.9* 11.0* 11.6* 10.4*  HCT 42.1 35.6* 33.1* 35.5* 32.0*  MCV 90.1 91.3 91.9 91.7 92.2  PLT 555* 479* 439* 566* 109*   Basic Metabolic Panel: Recent Labs  Lab 10/12/17 0900 10/12/17 1708 10/13/17 0640 10/14/17 0600 10/15/17 0555 10/16/17 0551  NA 135  --  140 136 139 141  K 4.1  --  4.0 4.5 4.3 4.1  CL 93*  --  104 103 105 107  CO2 29  --  26 25 26 27   GLUCOSE 119*  --  115* 124* 130* 120*  BUN 15  --  13 12 13 12   CREATININE 1.61*  --  1.09* 0.83 0.90 0.80  CALCIUM 9.6  --  8.7* 8.6* 9.3 9.1  MG  --  1.8 1.7 2.1 2.0  --   PHOS  --  3.8 3.1  --   --   --    GFR: Estimated Creatinine Clearance: 87.9 mL/min (by C-G formula based on SCr of 0.8 mg/dL). Liver Function Tests: Recent Labs  Lab 10/12/17 0900 10/13/17 0640  AST 16 13*  ALT 14 12  ALKPHOS 100 90  BILITOT 0.7 0.6  PROT 6.6 5.5*  ALBUMIN 2.7* 2.1*   Recent Labs  Lab  10/12/17 0900  LIPASE 22   No results for input(s): AMMONIA in the last 168 hours. Coagulation Profile: Recent Labs  Lab 10/12/17 0900  INR 0.98   Cardiac Enzymes: No results for input(s): CKTOTAL, CKMB, CKMBINDEX, TROPONINI in the last 168 hours. BNP (last 3 results) No results for input(s): PROBNP in the last 8760 hours. HbA1C: No results for input(s): HGBA1C in the last 72 hours. CBG: Recent Labs  Lab 10/13/17 0715 10/14/17 0741 10/15/17 0730 10/16/17 0815  GLUCAP 108* 110* 118* 93   Lipid Profile: No results for input(s): CHOL, HDL, LDLCALC, TRIG, CHOLHDL, LDLDIRECT in the last 72 hours. Thyroid Function Tests: No results for input(s): TSH, T4TOTAL, FREET4, T3FREE, THYROIDAB in the last 72 hours. Anemia Panel: No results for input(s): VITAMINB12, FOLATE, FERRITIN, TIBC, IRON, RETICCTPCT in the  last 72 hours. Sepsis Labs: Recent Labs  Lab 10/12/17 1033  LATICACIDVEN 1.82    Recent Results (from the past 240 hour(s))  Gastrointestinal Panel by PCR , Stool     Status: None   Collection Time: 10/12/17  9:04 AM  Result Value Ref Range Status   Campylobacter species NOT DETECTED NOT DETECTED Final   Plesimonas shigelloides NOT DETECTED NOT DETECTED Final   Salmonella species NOT DETECTED NOT DETECTED Final   Yersinia enterocolitica NOT DETECTED NOT DETECTED Final   Vibrio species NOT DETECTED NOT DETECTED Final   Vibrio cholerae NOT DETECTED NOT DETECTED Final   Enteroaggregative E coli (EAEC) NOT DETECTED NOT DETECTED Final   Enteropathogenic E coli (EPEC) NOT DETECTED NOT DETECTED Final   Enterotoxigenic E coli (ETEC) NOT DETECTED NOT DETECTED Final   Shiga like toxin producing E coli (STEC) NOT DETECTED NOT DETECTED Final   Shigella/Enteroinvasive E coli (EIEC) NOT DETECTED NOT DETECTED Final   Cryptosporidium NOT DETECTED NOT DETECTED Final   Cyclospora cayetanensis NOT DETECTED NOT DETECTED Final   Entamoeba histolytica NOT DETECTED NOT DETECTED Final    Giardia lamblia NOT DETECTED NOT DETECTED Final   Adenovirus F40/41 NOT DETECTED NOT DETECTED Final   Astrovirus NOT DETECTED NOT DETECTED Final   Norovirus GI/GII NOT DETECTED NOT DETECTED Final   Rotavirus A NOT DETECTED NOT DETECTED Final   Sapovirus (I, II, IV, and V) NOT DETECTED NOT DETECTED Final    Comment: Performed at [facility], [address] [place], [place] [address]  C difficile quick scan w PCR reflex     Status: None   Collection Time: 10/12/17  9:04 AM  Result Value Ref Range Status   C Diff antigen NEGATIVE NEGATIVE Final   C Diff toxin NEGATIVE NEGATIVE Final   C Diff interpretation No C. difficile detected.  Final    Comment: Performed at [facility], [place] [address] [place], [place] [address]  Calprotectin, Fecal     Status: Abnormal   Collection Time: 10/12/17  3:37 PM  Result Value Ref Range Status   Calprotectin, Fecal 1,036 (H) 0 - 120 ug/g Final    Comment: (NOTE) Concentration     Interpretation   Follow-Up <16 - 50 ug/g     Normal           None >50 -120 ug/g     Borderline       Re-evaluate in 4-6 weeks    >120 ug/g     Abnormal         Repeat as clinically                                   indicated Performed At: [facility] [place], [place] [id number] Rush Farmer MD JT:[id number]          Radiology Studies: No results found.      Scheduled Meds: . hydrocortisone  25 mg Rectal BID  . methylPREDNISolone (SOLU-MEDROL) injection  20 mg Intravenous Q8H  . metoprolol succinate  50 mg Oral Daily  . multivitamin with minerals  1 tablet Oral Daily   Continuous Infusions: . sodium chloride Stopped (10/12/17 1112)     LOS: 4 days    Time spent: 35 minutes    Irine Seal, MD [facility] Pager [phone]  If 7PM-7AM, please contact night-coverage [email or web address] Password [facility] 10/16/2017, 4:22 PM

## 2017-10-16 NOTE — Progress Notes (Signed)
Gastroenterology Inpatient Follow-up Note   PATIENT IDENTIFICATION  [name] is a 62 y.o. female who presented with diarrhea, rectal bleeding, hematochezia and has severe inflammation concerning for ulcerative colitis. Hospital Day: 5  SUBJECTIVE  The patient feels improved from yesterday to today. The patient denies fevers or chills. Patient was able to tolerate meals yesterday and today. Patient is tolerating her steroids without any issues.   OBJECTIVE  Scheduled Inpatient Medications:  . hydrocortisone  25 mg Rectal BID  . metoprolol succinate  50 mg Oral Daily  . multivitamin with minerals  1 tablet Oral Daily  . [START ON 10/17/2017] predniSONE  40 mg Oral Q breakfast   Continuous Inpatient Infusions:  . sodium chloride Stopped (10/12/17 1112)   PRN Inpatient Medications: acetaminophen, dicyclomine, HYDROmorphone (DILAUDID) injection, ondansetron **OR** ondansetron (ZOFRAN) IV   Physical Examination  Temp:  [98.1 F (36.7 C)-98.7 F (37.1 C)] 98.7 F (37.1 C) (08/24 2039) Pulse Rate:  [65-70] 70 (08/24 2039) Resp:  [18-20] 20 (08/24 2039) BP: (127-153)/(70-84) 153/84 (08/24 2039) SpO2:  [95 %-97 %] 96 % (08/24 2039) Weight:  [98.5 kg-98.9 kg] 98.5 kg (08/24 0700) Temp (24hrs), Avg:98.5 F (36.9 C), Min:98.1 F (36.7 C), Max:98.7 F (37.1 C)  Weight: 98.5 kg GEN: NAD, appears stated age, doesn't appear chronically ill PSYCH: Cooperative, without pressured speech EYE: Conjunctivae pink, sclerae anicteric ENT: MMM CV: RR without R/Gs  RESP: CTAB posteriorly GI: NABS, soft, tenderness to palpation in the midepigastrium (less than prior) without rebound or guarding MSK/EXT: No lower extremity NEURO:  Alert & Oriented x 3, no focal deficits   Review of Data   Laboratory Studies   Recent Labs  Lab 10/12/17 1708 10/13/17 0640  10/15/17 0555 10/16/17 0551  NA  --  140   < > 139 141  K  --  4.0   < > 4.3 4.1  CL  --  104   < > 105 107  CO2  --  26   <  > 26 27  BUN  --  13   < > 13 12  CREATININE  --  1.09*   < > 0.90 0.80  GLUCOSE  --  115*   < > 130* 120*  CALCIUM  --  8.7*   < > 9.3 9.1  MG 1.8 1.7   < > 2.0  --   PHOS 3.8 3.1  --   --   --    < > = values in this interval not displayed.   Recent Labs  Lab 10/13/17 0640  AST 13*  ALT 12  ALKPHOS 90    Recent Labs  Lab 10/14/17 0600 10/15/17 0555 10/16/17 0551  WBC 8.1 9.1 8.8  HGB 11.0* 11.6* 10.4*  HCT 33.1* 35.5* 32.0*  PLT 439* 566* 467*   Recent Labs  Lab 10/12/17 0900  INR 0.98   Computed MELD-Na score unavailable. Necessary lab results were not found in the last year. Computed MELD score unavailable. Necessary lab results were not found in the last year.  GI Procedures and Studies  No results found.   ASSESSMENT  Ms. Quant is a 62 y.o. female  who presented with diarrhea, rectal bleeding, hematochezia and has severe inflammation concerning for ulcerative colitis.  The patient seems to be doing well on IV steroids.  It is time for [place] to begin a transition to oral steroids to see if she can tolerate this.  Overall the patient has done well and continues to  make improvement on a day by day basis.  We will continue IV steroids today and transition tomorrow to oral steroids.  We will plan to monitor for at least 24 to 48 hours on oral steroids.  If she does well she will be able to be discharged on a long taper.  I have discussed her case with her primary gastroenterologist and he agrees with this plan of action.  Patient has been seen by surgery but is no longer in my eyes necessitating a surgical intervention at this point in time.  I am hopeful that we are able to salvage this patient and likely get her started on a biologic agent in the coming weeks.  We discussed briefly about Remicade and Humira and the patient will begin to consider these options.   PLAN/RECOMMENDATIONS  Continue IV Solu-Medrol on Saturday and transition to orals prednisone 40 mg daily on  Sunday (this order has been placed) Follow-up biological labs that were sent I think it is reasonable to start patient on DVT prophylaxis if medical service agrees and would consider Lovenox or heparin as per protocol   Please page/call with questions or concerns.   Justice Britain, MD [facility] Office # [phone]    LOS: 4 days  Irving Copas  10/16/2017, 10:25 PM

## 2017-10-17 LAB — CBC
HCT: 30.6 % — ABNORMAL LOW (ref 36.0–46.0)
Hemoglobin: 10 g/dL — ABNORMAL LOW (ref 12.0–15.0)
MCH: 30.2 pg (ref 26.0–34.0)
MCHC: 32.7 g/dL (ref 30.0–36.0)
MCV: 92.4 fL (ref 78.0–100.0)
PLATELETS: 457 10*3/uL — AB (ref 150–400)
RBC: 3.31 MIL/uL — AB (ref 3.87–5.11)
RDW: 14.6 % (ref 11.5–15.5)
WBC: 8.8 10*3/uL (ref 4.0–10.5)

## 2017-10-17 LAB — QUANTIFERON-TB GOLD PLUS (RQFGPL)
QUANTIFERON TB1 AG VALUE: 0.04 [IU]/mL
QUANTIFERON TB2 AG VALUE: 0.03 [IU]/mL
QuantiFERON Mitogen Value: 0.15 IU/mL
QuantiFERON Nil Value: 0.03 IU/mL

## 2017-10-17 LAB — BASIC METABOLIC PANEL
ANION GAP: 7 (ref 5–15)
BUN: 15 mg/dL (ref 8–23)
CALCIUM: 9.3 mg/dL (ref 8.9–10.3)
CO2: 28 mmol/L (ref 22–32)
CREATININE: 0.79 mg/dL (ref 0.44–1.00)
Chloride: 107 mmol/L (ref 98–111)
GFR calc Af Amer: >60 mL/min (ref >60)
Glucose, Bld: 122 mg/dL — ABNORMAL HIGH (ref 70–99)
POTASSIUM: 3.9 mmol/L (ref 3.5–5.1)
Sodium: 142 mmol/L (ref 135–145)

## 2017-10-17 LAB — GLUCOSE, CAPILLARY: GLUCOSE-CAPILLARY: 93 mg/dL (ref 70–99)

## 2017-10-17 LAB — QUANTIFERON-TB GOLD PLUS: QuantiFERON-TB Gold Plus: UNDETERMINED

## 2017-10-17 MED ORDER — ENOXAPARIN SODIUM 40 MG/0.4ML ~~LOC~~ SOLN
40.0000 mg | SUBCUTANEOUS | Status: DC
  Administered 2017-10-17 – 2017-10-18 (×2): 40 mg via SUBCUTANEOUS
  Filled 2017-10-17 (×2): qty 0.4

## 2017-10-17 MED ORDER — OXYCODONE-ACETAMINOPHEN 5-325 MG PO TABS
1.0000 | ORAL_TABLET | ORAL | Status: DC | PRN
  Administered 2017-10-17 – 2017-10-18 (×2): 1 via ORAL
  Filled 2017-10-17 (×2): qty 1
  Filled 2017-10-17: qty 2

## 2017-10-17 NOTE — Progress Notes (Signed)
PROGRESS NOTE    Jeniece Hannis  BLT:[id number] DOB: 1955-07-15 DOA: 10/12/2017 PCP: Patient, No Pcp Per    Brief Narrative:  62 year old female history of hypertension, hyperlipidemia, history of ulcerative colitis, history of MI status post bilateral knee surgery, history of arthritis, history of C. difficile colitis presenting with bloody diarrhea x2 weeks associated with abdominal pain nausea vomiting.  Patient admitted due to concerns for ulcerative colitis flare.  Patient started on prednisone.  GI following.  Patient for colonoscopy today 10/13/2017.   Assessment & Plan:   Principal Problem:   Ulcerative colitis, acute ([place]) Active Problems:   History of myocardial infarction   AKI (acute kidney injury) ([place])   Hyperglycemia   Hyperlipidemia   Thrombocytosis (HCC)   Leukocytosis   Other ulcerative colitis with rectal bleeding (HCC)   Dehydration  1 bloody diarrhea likely secondary to severe pancolitis ulcerative colitis flare Patient admitted with concerns for ulcerative colitis flare with bloody diarrhea abdominal pain nausea and vomiting.  FOBT done was positive.  C. difficile PCR GI pathogen panel negative.  CT abdomen and pelvis done showed decompressed colon, entire colonic mucosa edematous with suspicion for colitis.  No abscess seen.  Patient seen by [facility] gastroenterologist, Dr. Loletha Carrow III and patient has been started on Anusol.  Patient underwent flexible sigmoidoscopy on 10/13/2017 that showed severe pancolitis, ulcerative colitis with nonthrombosed external hemorrhoids.  Patient started on IV Solu-Medrol subsequently transition to oral prednisone that she is tolerating.  Continue hydrocortisone suppositories.  Biologic labs pending.  Per gastroenterology.  2.  Prior history of MI (presumed type II non-STEMI) In the setting of prior blood loss for bilateral knee arthroplasty.  Denies any chest pain.  Continue current regimen of metoprolol succinate 50 mg nightly.   Follow.  3.  Acute kidney injury Secondary to prerenal azotemia secondary to GI loss.  Improved with hydration.  Follow.   4.  Hyperlipidemia Patient no longer on a statin.  Fasting lipid panel with a LDL of 42, HDL of 39.  Outpatient follow-up.  5.  Leukocytosis In the setting of ulcerative colitis flare.  Likely a reactive leukocytosis.  WBC trended down.  Urinalysis nitrite negative leukocytes negative.  C. difficile PCR negative.  GI pathogen panel negative.  Patient afebrile.  No need for antibiotics at this time.  Monitor while on steroids.  Follow.  6.  Thrombocytosis Likely reactive secondary to ulcerative colitis flare.  Platelet count at admission was 555.  Platelet count trending back down and now at 457 from 467 from 566. Follow.  7.  Hyperglycemia Hemoglobin A1c 5.7.  CBG 93 this morning.  Patient has been transitioned from IV steroids to oral prednisone taper. Outpatient follow-up.   DVT prophylaxis: SCDs Code Status: Full Family Communication: Updated patient.  No family at bedside. Disposition Plan: Home once clinically improved and cleared medically per gastroenterology.   Consultants:   Gastroenterology: Dr. Rush Landmark 10/12/2017  General surgery: Dr. Windle Guard 10/15/2017  Procedures:   CT abdomen and pelvis 10/12/2017  Flexible sigmoidoscopy by Dr. Rush Landmark 10/13/2017  Antimicrobials:  None   Subjective: Watching television.  No nausea or emesis.  States lower extreme abdominal pain improving.  Still with some blood in her stool however that is also improving.  No chest pain or shortness of breath.  Tolerating oral intake.    Objective: Vitals:   10/16/17 2039 10/17/17 0423 10/17/17 0606 10/17/17 1402  BP: (!) 153/84 123/70  (!) 151/66  Pulse: 70 (!) 56  70  Resp: 20 12  18  Temp: 98.7 F (37.1 C) 98.3 F (36.8 C)  97.8 F (36.6 C)  TempSrc: Oral Oral  Oral  SpO2: 96% 94%  98%  Weight:   97.1 kg   Height:        Intake/Output Summary (Last 24  hours) at 10/17/2017 1655 Last data filed at 10/17/2017 1300 Gross per 24 hour  Intake 480 ml  Output -  Net 480 ml   Filed Weights   10/16/17 0459 10/16/17 0700 10/17/17 0606  Weight: 98.9 kg 98.5 kg 97.1 kg    Examination:  General exam: NAD Respiratory system: CTAB.  No wheezes, no crackles, no rhonchi.  Normal respiratory effort.   Cardiovascular system: RRR no murmurs rubs or gallops.  No JVD.  No lower extremity edema.   Gastrointestinal system: Abdomen is soft, nondistended, decreased tenderness to palpation, positive bowel sounds.  No rebound.  No guarding.      Central nervous system: Alert and oriented.  No focal neurological deficits.   Extremities: Symmetric 5 x 5 power. Skin: No rashes, lesions or ulcers Psychiatry: Judgement and insight appear normal. Mood & affect appropriate.     Data Reviewed: I have personally reviewed following labs and imaging studies  CBC: Recent Labs  Lab 10/12/17 0900 10/13/17 0640 10/14/17 0600 10/15/17 0555 10/16/17 0551 10/17/17 0513  WBC 12.9* 9.2 8.1 9.1 8.8 8.8  NEUTROABS 9.5 6.3 6.7 6.5 6.1  --   HGB 14.4 11.9* 11.0* 11.6* 10.4* 10.0*  HCT 42.1 35.6* 33.1* 35.5* 32.0* 30.6*  MCV 90.1 91.3 91.9 91.7 92.2 92.4  PLT 555* 479* 439* 566* 467* 179*   Basic Metabolic Panel: Recent Labs  Lab 10/12/17 1708 10/13/17 0640 10/14/17 0600 10/15/17 0555 10/16/17 0551 10/17/17 0513  NA  --  140 136 139 141 142  K  --  4.0 4.5 4.3 4.1 3.9  CL  --  104 103 105 107 107  CO2  --  26 25 26 27 28   GLUCOSE  --  115* 124* 130* 120* 122*  BUN  --  13 12 13 12 15   CREATININE  --  1.09* 0.83 0.90 0.80 0.79  CALCIUM  --  8.7* 8.6* 9.3 9.1 9.3  MG 1.8 1.7 2.1 2.0  --   --   PHOS 3.8 3.1  --   --   --   --    GFR: Estimated Creatinine Clearance: 87.2 mL/min (by C-G formula based on SCr of 0.79 mg/dL). Liver Function Tests: Recent Labs  Lab 10/12/17 0900 10/13/17 0640  AST 16 13*  ALT 14 12  ALKPHOS 100 90  BILITOT 0.7 0.6  PROT  6.6 5.5*  ALBUMIN 2.7* 2.1*   Recent Labs  Lab 10/12/17 0900  LIPASE 22   No results for input(s): AMMONIA in the last 168 hours. Coagulation Profile: Recent Labs  Lab 10/12/17 0900  INR 0.98   Cardiac Enzymes: No results for input(s): CKTOTAL, CKMB, CKMBINDEX, TROPONINI in the last 168 hours. BNP (last 3 results) No results for input(s): PROBNP in the last 8760 hours. HbA1C: No results for input(s): HGBA1C in the last 72 hours. CBG: Recent Labs  Lab 10/13/17 0715 10/14/17 0741 10/15/17 0730 10/16/17 0815 10/17/17 0745  GLUCAP 108* 110* 118* 93 93   Lipid Profile: No results for input(s): CHOL, HDL, LDLCALC, TRIG, CHOLHDL, LDLDIRECT in the last 72 hours. Thyroid Function Tests: No results for input(s): TSH, T4TOTAL, FREET4, T3FREE, THYROIDAB in the last 72 hours. Anemia Panel: No results for input(s):  VITAMINB12, FOLATE, FERRITIN, TIBC, IRON, RETICCTPCT in the last 72 hours. Sepsis Labs: Recent Labs  Lab 10/12/17 1033  LATICACIDVEN 1.82    Recent Results (from the past 240 hour(s))  Gastrointestinal Panel by PCR , Stool     Status: None   Collection Time: 10/12/17  9:04 AM  Result Value Ref Range Status   Campylobacter species NOT DETECTED NOT DETECTED Final   Plesimonas shigelloides NOT DETECTED NOT DETECTED Final   Salmonella species NOT DETECTED NOT DETECTED Final   Yersinia enterocolitica NOT DETECTED NOT DETECTED Final   Vibrio species NOT DETECTED NOT DETECTED Final   Vibrio cholerae NOT DETECTED NOT DETECTED Final   Enteroaggregative E coli (EAEC) NOT DETECTED NOT DETECTED Final   Enteropathogenic E coli (EPEC) NOT DETECTED NOT DETECTED Final   Enterotoxigenic E coli (ETEC) NOT DETECTED NOT DETECTED Final   Shiga like toxin producing E coli (STEC) NOT DETECTED NOT DETECTED Final   Shigella/Enteroinvasive E coli (EIEC) NOT DETECTED NOT DETECTED Final   Cryptosporidium NOT DETECTED NOT DETECTED Final   Cyclospora cayetanensis NOT DETECTED NOT DETECTED  Final   Entamoeba histolytica NOT DETECTED NOT DETECTED Final   Giardia lamblia NOT DETECTED NOT DETECTED Final   Adenovirus F40/41 NOT DETECTED NOT DETECTED Final   Astrovirus NOT DETECTED NOT DETECTED Final   Norovirus GI/GII NOT DETECTED NOT DETECTED Final   Rotavirus A NOT DETECTED NOT DETECTED Final   Sapovirus (I, II, IV, and V) NOT DETECTED NOT DETECTED Final    Comment: Performed at [facility], [address] [place], [place] [address]  C difficile quick scan w PCR reflex     Status: None   Collection Time: 10/12/17  9:04 AM  Result Value Ref Range Status   C Diff antigen NEGATIVE NEGATIVE Final   C Diff toxin NEGATIVE NEGATIVE Final   C Diff interpretation No C. difficile detected.  Final    Comment: Performed at [facility], [place] [address], [place], [place] [address]  Calprotectin, Fecal     Status: Abnormal   Collection Time: 10/12/17  3:37 PM  Result Value Ref Range Status   Calprotectin, Fecal 1,036 (H) 0 - 120 ug/g Final    Comment: (NOTE) Concentration     Interpretation   Follow-Up <16 - 50 ug/g     Normal           None >50 -120 ug/g     Borderline       Re-evaluate in 4-6 weeks    >120 ug/g     Abnormal         Repeat as clinically                                   indicated Performed At: [facility] [place], [place] [id number] Rush Farmer MD VO:[id number]          Radiology Studies: No results found.      Scheduled Meds: . enoxaparin (LOVENOX) injection  40 mg Subcutaneous Q24H  . hydrocortisone  25 mg Rectal BID  . metoprolol succinate  50 mg Oral Daily  . multivitamin with minerals  1 tablet Oral Daily  . predniSONE  40 mg Oral Q breakfast   Continuous Infusions: . sodium chloride Stopped (10/12/17 1112)     LOS: 5 days    Time spent: 35 minutes    Irine Seal, MD [facility] Pager [phone]  [phone]  If 7PM-7AM, please contact night-coverage [email or web address] Password  [facility] 10/17/2017, 4:55 PM

## 2017-10-17 NOTE — Progress Notes (Addendum)
Gastroenterology Inpatient Follow-up Note   PATIENT IDENTIFICATION  Zion Lint is a 62 y.o. female who presented with diarrhea, rectal bleeding, hematochezia and has severe inflammation concerning for ulcerative colitis. Hospital Day: 6  SUBJECTIVE  Continues to improve daily with only 4 BMs total yesterday and 2 of them with blood. Able to tolerate diet well. Able to tolerate PO Prednisone this AM without issue.   OBJECTIVE  Scheduled Inpatient Medications:  . enoxaparin (LOVENOX) injection  40 mg Subcutaneous Q24H  . hydrocortisone  25 mg Rectal BID  . metoprolol succinate  50 mg Oral Daily  . multivitamin with minerals  1 tablet Oral Daily  . predniSONE  40 mg Oral Q breakfast   Continuous Inpatient Infusions:  . sodium chloride Stopped (10/12/17 1112)   PRN Inpatient Medications: acetaminophen, dicyclomine, HYDROmorphone (DILAUDID) injection, ondansetron **OR** ondansetron (ZOFRAN) IV, oxyCODONE-acetaminophen   Physical Examination  Temp:  [97.8 F (36.6 C)-98.7 F (37.1 C)] 97.8 F (36.6 C) (08/25 1402) Pulse Rate:  [56-70] 70 (08/25 1402) Resp:  [12-20] 18 (08/25 1402) BP: (123-153)/(66-84) 151/66 (08/25 1402) SpO2:  [94 %-98 %] 98 % (08/25 1402) Weight:  [97.1 kg] 97.1 kg (08/25 0606) Temp (24hrs), Avg:98.3 F (36.8 C), Min:97.8 F (36.6 C), Max:98.7 F (37.1 C)  Weight: 97.1 kg GEN: NAD, appears stated age, doesn't appear chronically ill PSYCH: Cooperative, without pressured speech EYE: Conjunctivae pink, sclerae anicteric ENT: MMM CV: RR without R/Gs  RESP: CTAB posteriorly GI: NABS, soft, even less tenderness to palpation in the midepigastrium than yesterday, without rebound or guarding MSK/EXT: No lower extremity edema NEURO:  Alert & Oriented x 3, no focal deficits   Review of Data   Laboratory Studies   Recent Labs  Lab 10/12/17 1708 10/13/17 0640  10/15/17 0555  10/17/17 0513  NA  --  140   < > 139   < > 142  K  --  4.0   < > 4.3   < >  3.9  CL  --  104   < > 105   < > 107  CO2  --  26   < > 26   < > 28  BUN  --  13   < > 13   < > 15  CREATININE  --  1.09*   < > 0.90   < > 0.79  GLUCOSE  --  115*   < > 130*   < > 122*  CALCIUM  --  8.7*   < > 9.3   < > 9.3  MG 1.8 1.7   < > 2.0  --   --   PHOS 3.8 3.1  --   --   --   --    < > = values in this interval not displayed.   Recent Labs  Lab 10/13/17 0640  AST 13*  ALT 12  ALKPHOS 90    Recent Labs  Lab 10/15/17 0555 10/16/17 0551 10/17/17 0513  WBC 9.1 8.8 8.8  HGB 11.6* 10.4* 10.0*  HCT 35.5* 32.0* 30.6*  PLT 566* 467* 457*   Recent Labs  Lab 10/12/17 0900  INR 0.98   Computed MELD-Na score unavailable. Necessary lab results were not found in the last year. Computed MELD score unavailable. Necessary lab results were not found in the last year.  GI Procedures and Studies  No results found.   ASSESSMENT  Ms. Dardis is a 62 y.o. female  who presented with diarrhea, rectal bleeding, hematochezia and has  severe inflammation concerning for ulcerative colitis.  Patient is doing well. We spent about 15 minutes discussing different Biologic agents and she is going to do some research on the Crohn's & Colitis website as to what she thinks she will want to do. Will plan to continue PO Prednisone for now and taper to follow based on how she does into Monday. I suspect she will be ready for discharge very soon, and she is hopeful for that as well.   PLAN/RECOMMENDATIONS  Continue Prednisone 40 mg daily with plan for taper to be written on Monday if tolerates PR [place] suppository QHS Follow-up biological labs that were sent Continue VTE PPx Would ask Hospitalist team work on removing Prednisone as an allergy/intolerance as able   Please page/call with questions or concerns.   Justice Britain, MD [facility] Office # [phone]    LOS: 5 days  Irving Copas  10/17/2017, 3:26 PM

## 2017-10-18 ENCOUNTER — Telehealth: Payer: Self-pay

## 2017-10-18 DIAGNOSIS — K5190 Ulcerative colitis, unspecified, without complications: Secondary | ICD-10-CM

## 2017-10-18 LAB — BASIC METABOLIC PANEL
Anion gap: 8 (ref 5–15)
BUN: 14 mg/dL (ref 8–23)
CO2: 28 mmol/L (ref 22–32)
Calcium: 8.8 mg/dL — ABNORMAL LOW (ref 8.9–10.3)
Chloride: 105 mmol/L (ref 98–111)
Creatinine, Ser: 0.74 mg/dL (ref 0.44–1.00)
GFR calc non Af Amer: >60 mL/min (ref >60)
Glucose, Bld: 82 mg/dL (ref 70–99)
POTASSIUM: 3.4 mmol/L — AB (ref 3.5–5.1)
SODIUM: 141 mmol/L (ref 135–145)

## 2017-10-18 LAB — CBC
HEMATOCRIT: 30.7 % — AB (ref 36.0–46.0)
HEMOGLOBIN: 10.2 g/dL — AB (ref 12.0–15.0)
MCH: 30.7 pg (ref 26.0–34.0)
MCHC: 33.2 g/dL (ref 30.0–36.0)
MCV: 92.5 fL (ref 78.0–100.0)
Platelets: 418 10*3/uL — ABNORMAL HIGH (ref 150–400)
RBC: 3.32 MIL/uL — AB (ref 3.87–5.11)
RDW: 14.5 % (ref 11.5–15.5)
WBC: 11.2 10*3/uL — AB (ref 4.0–10.5)

## 2017-10-18 LAB — GLUCOSE, CAPILLARY: GLUCOSE-CAPILLARY: 74 mg/dL (ref 70–99)

## 2017-10-18 MED ORDER — OXYCODONE-ACETAMINOPHEN 5-325 MG PO TABS: 1.0000 | ORAL_TABLET | ORAL | 0 refills | Status: DC | PRN

## 2017-10-18 MED ORDER — POTASSIUM CHLORIDE CRYS ER 20 MEQ PO TBCR
40.0000 meq | EXTENDED_RELEASE_TABLET | Freq: Once | ORAL | Status: AC
  Administered 2017-10-18: 40 meq via ORAL
  Filled 2017-10-18: qty 2

## 2017-10-18 MED ORDER — PREDNISONE 20 MG PO TABS: 20.0000 mg | ORAL_TABLET | Freq: Every day | ORAL | 0 refills | Status: AC

## 2017-10-18 MED ORDER — HYDROCORTISONE ACETATE 25 MG RE SUPP: 25.0000 mg | Freq: Two times a day (BID) | RECTAL | 0 refills | Status: AC

## 2017-10-18 NOTE — Progress Notes (Addendum)
Patient ID: [name], female   DOB: 1955/06/29, 62 y.o.   MRN: [id number]    Progress Note   Subjective  Hospital day #7   Feeling better , tolerating solid diet,less abdominal  pain- had 4 BM's yesterday with small amt blood.  On oral steroids   hgb 10 .2  Hepatitis panel -neg   thiopurine metabolites- pend    Objective   Vital signs in last 24 hours: Temp:  [97.8 F (36.6 C)-98.5 F (36.9 C)] 98.5 F (36.9 C) (08/26 0409) Pulse Rate:  [67-79] 79 (08/26 0409) Resp:  [18-19] 18 (08/26 0409) BP: (115-151)/(66-74) 115/74 (08/26 0409) SpO2:  [93 %-98 %] 93 % (08/26 0409) Last BM Date: 10/16/17 General:   AA female in NAD Heart:  Regular rate and rhythm; no murmurs Lungs: Respirations even and unlabored, lungs CTA bilaterally Abdomen:  Soft, tender rather generally , no guarding and nondistended. Normal bowel sounds. Extremities:  Without edema. Neurologic:  Alert and oriented,  grossly normal neurologically. Psych:  Cooperative. Normal mood and affect.  Intake/Output from previous day: 08/25 0701 - 08/26 0700 In: 480 [P.O.:480] Out: -  Intake/Output this shift: No intake/output data recorded.  Lab Results: Recent Labs    10/16/17 0551 10/17/17 0513 10/18/17 0538  WBC 8.8 8.8 11.2*  HGB 10.4* 10.0* 10.2*  HCT 32.0* 30.6* 30.7*  PLT 467* 457* 418*   BMET Recent Labs    10/16/17 0551 10/17/17 0513 10/18/17 0538  NA 141 142 141  K 4.1 3.9 3.4*  CL 107 107 105  CO2 27 28 28   GLUCOSE 120* 122* 82  BUN 12 15 14   CREATININE 0.80 0.79 0.74  CALCIUM 9.1 9.3 8.8*      Assessment / Plan:     #1 62 yo AA female with hx of Ulcerative colitis who had not been on any meds over the past few years  Admitted with acute N/V , diarrhea and rectal bleeding  X  3 weeks .  Flex this admit shows severe colitis , and bx c/w ulcerative colitis  She has improved with IV steroids ,and transitioned to oral prednisone 40 mg daily  Thiopurine metabolites pending    Will  check Quantiferon gold this am Ok for discharge today - continue bentyl, Zofran prn , percocet  prn   Continue  prednisone 40 mg po qam x 2 weeks then decrease to 30 mg daily  X 2 weeks then decrease to 20 mg daily   Will arrange office follow up with Dr Loletha Carrow within  2 weeks - she is amenable to Biologic RX - will initiate approval process through our office        Contact  Amy [place], P.A.-C               [phone]      Principal Problem:   Ulcerative colitis, acute ([place]) Active Problems:   History of myocardial infarction   AKI (acute kidney injury) ([place])   Hyperglycemia   Hyperlipidemia   Thrombocytosis (HCC)   Leukocytosis   Other ulcerative colitis with rectal bleeding ([place])   Dehydration     LOS: 6 days   Amy Esterwood  10/18/2017, 9:42 AM    Attending physician's note   I have discussed this case on rounds, independently reviewed the chart I agree with the Advanced Practitioner's note, impression, and recommendations.     [address], DO, FACG [phone] office

## 2017-10-18 NOTE — Progress Notes (Signed)
[name] to be D/C'd Home per MD order.  Discussed prescriptions and follow up appointments with the patient. Prescriptions given to patient, medication list explained in detail. Pt verbalized understanding.  Allergies as of 10/18/2017      Reactions   Prednisone Other (See Comments)   Tachycardia, nervous, sweating , jitters      Medication List    TAKE these medications   acetaminophen 500 MG tablet Commonly known as:  TYLENOL Take 500 mg by mouth every 6 (six) hours as needed (For arthritis pain.).   Calcium-Vitamin D 600-400 MG-UNIT Tabs Take 1 tablet by mouth daily.   dicyclomine 10 MG capsule Commonly known as:  BENTYL Take 1 capsule (10 mg total) by mouth every 8 (eight) hours as needed for spasms.   hydrocortisone 25 MG suppository Commonly known as:  ANUSOL-HC Place 1 suppository (25 mg total) rectally 2 (two) times daily.   metoprolol succinate 50 MG 24 hr tablet Commonly known as:  TOPROL-XL Take 50 mg by mouth daily. Take with or immediately following a meal.   multivitamin tablet Take 1 tablet by mouth daily.   ondansetron 4 MG tablet Commonly known as:  ZOFRAN Take 1 tablet (4 mg total) by mouth every 8 (eight) hours as needed for nausea or vomiting.   oxyCODONE-acetaminophen 5-325 MG tablet Commonly known as:  PERCOCET/ROXICET Take 1-2 tablets by mouth every 4 (four) hours as needed for moderate pain or severe pain.   predniSONE 20 MG tablet Commonly known as:  DELTASONE Take 1-2 tablets (20-40 mg total) by mouth daily with breakfast. Take 2 tablets (40mg ) daily x 14 days, then 1.5 tablets(30mg ) daily x 14 days, then 1 tablets (20mg ) daily Start taking on:  10/19/2017       Vitals:   10/18/17 0409 10/18/17 1545  BP: 115/74 123/75  Pulse: 79 76  Resp: 18 18  Temp: 98.5 F (36.9 C) 99.1 F (37.3 C)  SpO2: 93% 97%    Skin clean, dry and intact without evidence of skin break down, no evidence of skin tears noted. IV catheter discontinued intact.  Site without signs and symptoms of complications. Dressing and pressure applied. Pt denies pain at this time. No complaints noted.  An After Visit Summary was printed and given to the patient. Patient escorted via [place], and D/C home via private auto.  [name] 10/18/2017 4:27 PM

## 2017-10-18 NOTE — Care Management Note (Signed)
Case Management Note  Patient Details  Name: [name] MRN: [id number] Date of Birth: 1955-08-15  Subjective/Objective:                  1 62 yo AA female with hx of Ulcerative colitis who had not been on any meds over the past few years  Admitted with acute N/V , diarrhea and rectal bleeding  X  3 weeks .  Flex this admit shows severe colitis , and bx c/w ulcerative colitis  She has improved with IV steroids ,and transitioned to oral prednisone 40 mg daily  Thiopurine metabolites pending    Will check Quantiferon gold this am Ok for discharge today  Action/Plan: Will await dc orders andf possible cm needs  Expected Discharge Date:  (unknown)               Expected Discharge Plan:  Home/Self Care  In-House Referral:     Discharge planning Services  CM Consult  Post Acute Care Choice:    Choice offered to:     DME Arranged:    DME Agency:     HH Arranged:    HH Agency:     Status of Service:  In process, will continue to follow  If discussed at Long Length of Stay Meetings, dates discussed:    Additional Comments:  Leeroy Cha, RN 10/18/2017, 11:39 AM

## 2017-10-18 NOTE — Discharge Summary (Signed)
Physician Discharge Summary  [name] MVE:[id number] DOB: Jul 11, 1955 DOA: 10/12/2017  PCP: Patient, No Pcp Per  Admit date: 10/12/2017 Discharge date: 10/18/2017  Time spent: 65 minutes  Recommendations for Outpatient Follow-up:  1. Follow-up with Dr. Loletha Carrow III in 2 weeks for further management of ulcerative colitis flare.  Office will call with an appointment time.   Discharge Diagnoses:  Principal Problem:   Ulcerative colitis, acute ([place]) Active Problems:   History of myocardial infarction   AKI (acute kidney injury) ([place])   Hyperglycemia   Hyperlipidemia   Thrombocytosis (HCC)   Leukocytosis   Other ulcerative colitis with rectal bleeding (HCC)   Dehydration   Discharge Condition: Stable and improved.  Diet recommendation: Heart healthy  Filed Weights   10/16/17 0459 10/16/17 0700 10/17/17 0606  Weight: 98.9 kg 98.5 kg 97.1 kg    History of present illness:  Per Dr Sherre Poot is a 62 y.o. female with medical history significant of hypertension, hyperlipidemia no longer taking her statin, history of ulcerative colitis, history of MI after bilateral knee surgery likely attributed to blood loss, history of arthritis, and history of C. difficile diarrhea and other comorbidities who presented with a chief complaint of bloody diarrhea for last [redacted] weeks along associated persistent sharp abdominal pain along with nausea and vomiting.  Patient stated that symptoms started 2 weeks ago and patient feels very similar to previous UC  flare. Started with bloody diarrhea and averageing 10-11 bowel movements and now dow to 8. Nausea and vomiting started a few days after the diarrhea. Had SOB. No CP and no Lightheadeness or dizziness.  Feels weak and Fatigued. Abominal Pain started same time as diarrhea and lower abdominal pain 7/10 and persistent. Sharp stabbing pain.  No current pain right now as she received IV Dilaudid.  Recently saw Dr. Loletha Carrow of GI who started her on prednisone  suppositories however she did not pick these up yet.  Because of symptoms persisting she presented to the ED for further evaluation.  [facility] was called to admit this patient for a acute ulcerative colitis flare and gastroenterology was consulted for further evaluation and recommendations and are planning to take the patient for colonoscopy in a.m.  ED Course: Had basic blood work done including a C. difficile as well as a GI pathogen panel.  She also had a CT of the abdomen pelvis done along with a twelve-lead EKG. lactic acid level was normal  Hospital Course:  1 bloody diarrhea likely secondary to severe pancolitis ulcerative colitis flare Patient admitted with concerns for ulcerative colitis flare with bloody diarrhea abdominal pain nausea and vomiting.  FOBT done was positive.  C. difficile PCR GI pathogen panel negative.  CT abdomen and pelvis done showed decompressed colon, entire colonic mucosa edematous with suspicion for colitis.  No abscess seen.  Patient seen by [facility] gastroenterologist, Dr. Loletha Carrow III and patient has been started on Anusol.  Patient underwent flexible sigmoidoscopy on 10/13/2017 that showed severe pancolitis, ulcerative colitis with nonthrombosed external hemorrhoids.  Patient started on IV Solu-Medrol subsequently transition to oral prednisone that she tolerated.  Patient will be discharged on a prednisone taper as recommended per gastroenterology.  Patient was also maintained on hydrocortisone suppositories that this patient will be discharged on.  Outpatient follow-up with gastroenterology in 2 weeks.    2.  Prior history of MI (presumed type II non-STEMI) In the setting of prior blood loss for bilateral knee arthroplasty.  Denied any chest pain during the hospitalization.  Patient maintained on home regimen of metoprolol succinate 50 mg nightly.  3.  Acute kidney injury Secondary to prerenal azotemia secondary to GI loss.   Resolved with hydration.  Outpatient follow-up.    4.  Hyperlipidemia Patient no longer on a statin.  Fasting lipid panel with a LDL of 42, HDL of 39.  Outpatient follow-up.  5.  Leukocytosis In the setting of ulcerative colitis flare.  Likely a reactive leukocytosis.  WBC trended down.  Urinalysis nitrite negative leukocytes negative.  C. difficile PCR negative.  GI pathogen panel negative.  Patient afebrile.  No need for antibiotics at this time.    6.  Thrombocytosis Likely reactive secondary to ulcerative colitis flare.  Platelet count at admission was 555.  Platelet count trending back down and now at 457 from 467 from 566.   7.  Hyperglycemia Hemoglobin A1c 5.7.    Blood sugars were followed during the hospitalization as patient has been started on steroids.  Outpatient follow-up.    Procedures:  CT abdomen and pelvis 10/12/2017  Flexible sigmoidoscopy by Dr. Rush Landmark 10/13/2017  Consultations:  Gastroenterology: Dr. Rush Landmark 10/12/2017  General surgery: Dr. Windle Guard 10/15/2017  Discharge Exam: Vitals:   10/18/17 0409 10/18/17 1545  BP: 115/74 123/75  Pulse: 79 76  Resp: 18 18  Temp: 98.5 F (36.9 C) 99.1 F (37.3 C)  SpO2: 93% 97%    General: NAD Cardiovascular: RRR Respiratory: CTAB  Discharge Instructions   Discharge Instructions    Diet general   Complete by:  As directed    Increase activity slowly   Complete by:  As directed      Allergies as of 10/18/2017      Reactions   Prednisone Other (See Comments)   Tachycardia, nervous, sweating , jitters      Medication List    TAKE these medications   acetaminophen 500 MG tablet Commonly known as:  TYLENOL Take 500 mg by mouth every 6 (six) hours as needed (For arthritis pain.).   Calcium-Vitamin D 600-400 MG-UNIT Tabs Take 1 tablet by mouth daily.   dicyclomine 10 MG capsule Commonly known as:  BENTYL Take 1 capsule (10 mg total) by mouth every 8 (eight) hours as needed for spasms.   hydrocortisone 25 MG suppository Commonly known as:   ANUSOL-HC Place 1 suppository (25 mg total) rectally 2 (two) times daily.   metoprolol succinate 50 MG 24 hr tablet Commonly known as:  TOPROL-XL Take 50 mg by mouth daily. Take with or immediately following a meal.   multivitamin tablet Take 1 tablet by mouth daily.   ondansetron 4 MG tablet Commonly known as:  ZOFRAN Take 1 tablet (4 mg total) by mouth every 8 (eight) hours as needed for nausea or vomiting.   oxyCODONE-acetaminophen 5-325 MG tablet Commonly known as:  PERCOCET/ROXICET Take 1-2 tablets by mouth every 4 (four) hours as needed for moderate pain or severe pain.   predniSONE 20 MG tablet Commonly known as:  DELTASONE Take 1-2 tablets (20-40 mg total) by mouth daily with breakfast. Take 2 tablets (40mg ) daily x 14 days, then 1.5 tablets(30mg ) daily x 14 days, then 1 tablets (20mg ) daily Start taking on:  10/19/2017      Allergies  Allergen Reactions  . Prednisone Other (See Comments)    Tachycardia, nervous, sweating , jitters   Follow-up Information    [facility] [place] Follow up.   Specialty:  General Surgery Why:  you do not need to call us unless your GI doctors  recommend you see [place] again.  Dr. Kae Heller, along with other providers from our practice saw you during your admission. I have provided the office information for your convience.  Contact information: [place] [address] [phone]        Doran Stabler, MD. Schedule an appointment as soon as possible for a visit in 2 week(s).   Specialty:  Gastroenterology Why:  OFFICE WILL CALL WITH APPOINTMENT TIME. Contact information: [place] [address] [phone]            The results of significant diagnostics from this hospitalization (including imaging, microbiology, ancillary and laboratory) are listed below for reference.    Significant Diagnostic Studies: Ct Abdomen Pelvis W Contrast  Result Date: 10/12/2017 CLINICAL DATA:   Nausea and vomiting for 2 weeks, history of ulcerative colitis, patient could not drink oral contrast due to vomiting EXAM: CT ABDOMEN AND PELVIS WITH CONTRAST TECHNIQUE: Multidetector CT imaging of the abdomen and pelvis was performed using the standard protocol following bolus administration of intravenous contrast. CONTRAST:  85mL OMNIPAQUE IOHEXOL 300 MG/ML  SOLN COMPARISON:  None. FINDINGS: Lower chest: The lung bases are clear. The heart is within normal limits in size. No pericardial effusion is seen. Hepatobiliary: Tiny subcentimeter low-attenuation structures in the liver most consistent with a benign process such as cysts. No focal hepatic abnormality is seen. The gallbladder is slightly distended but no calcified gallstones are seen. Pancreas: The pancreas is normal in size and the pancreatic duct is not dilated. Spleen: The spleen is unremarkable. Adrenals/Urinary Tract: The adrenal glands appear normal. The kidneys enhance with no mass or calculus. On delayed images, the pelvocaliceal systems are unremarkable. Ureters appear normal in caliber. The urinary bladder is not well distended but no abnormality is noted. Stomach/Bowel: The stomach is completely decompressed and cannot be well assessed. No abnormality of small bowel is seen. The colon however is completely decompressed and does appear to be somewhat edematous diffusely. Diffuse colitis is a definite consideration. No focal abscess is seen. The terminal ileum and the appendix are unremarkable. Vascular/Lymphatic: The abdominal aorta is normal in caliber. No focal aneurysmal dilatation is seen. No adenopathy is noted. Reproductive: The uterus is normal in size. No adnexal lesion seen with probable tiny follicles on the right. No free fluid is seen within the pelvis. Other: No abdominal wall hernia is seen. Musculoskeletal: There degenerative changes involving both SI joints with sclerosis and irregularity. The lumbar vertebrae are in normal  alignment with intervertebral disc spaces appear grossly normal. However considerable degenerative change does involve the facet joints of the mid to lower lumbar spine diffusely. IMPRESSION: 1. Although the colon is completely decompressed, the entire colonic mucosa appears somewhat edematous, suspicious for diffuse colitis. Correlate clinically. No abscess is seen. 2. Degenerative changes throughout the facet joints of the lower lumbar spine and within the SI joints. Electronically Signed   By: Ivar Drape M.D.   On: 10/12/2017 13:25    Microbiology: Recent Results (from the past 240 hour(s))  Gastrointestinal Panel by PCR , Stool     Status: None   Collection Time: 10/12/17  9:04 AM  Result Value Ref Range Status   Campylobacter species NOT DETECTED NOT DETECTED Final   Plesimonas shigelloides NOT DETECTED NOT DETECTED Final   Salmonella species NOT DETECTED NOT DETECTED Final   Yersinia enterocolitica NOT DETECTED NOT DETECTED Final   Vibrio species NOT DETECTED NOT DETECTED Final  Vibrio cholerae NOT DETECTED NOT DETECTED Final   Enteroaggregative E coli (EAEC) NOT DETECTED NOT DETECTED Final   Enteropathogenic E coli (EPEC) NOT DETECTED NOT DETECTED Final   Enterotoxigenic E coli (ETEC) NOT DETECTED NOT DETECTED Final   Shiga like toxin producing E coli (STEC) NOT DETECTED NOT DETECTED Final   Shigella/Enteroinvasive E coli (EIEC) NOT DETECTED NOT DETECTED Final   Cryptosporidium NOT DETECTED NOT DETECTED Final   Cyclospora cayetanensis NOT DETECTED NOT DETECTED Final   Entamoeba histolytica NOT DETECTED NOT DETECTED Final   Giardia lamblia NOT DETECTED NOT DETECTED Final   Adenovirus F40/41 NOT DETECTED NOT DETECTED Final   Astrovirus NOT DETECTED NOT DETECTED Final   Norovirus GI/GII NOT DETECTED NOT DETECTED Final   Rotavirus A NOT DETECTED NOT DETECTED Final   Sapovirus (I, II, IV, and V) NOT DETECTED NOT DETECTED Final    Comment: Performed at [facility], [address] [place], [place] [address]  C difficile quick scan w PCR reflex     Status: None   Collection Time: 10/12/17  9:04 AM  Result Value Ref Range Status   C Diff antigen NEGATIVE NEGATIVE Final   C Diff toxin NEGATIVE NEGATIVE Final   C Diff interpretation No C. difficile detected.  Final    Comment: Performed at [facility], [place] [address], [place], [place] [address]  Calprotectin, Fecal     Status: Abnormal   Collection Time: 10/12/17  3:37 PM  Result Value Ref Range Status   Calprotectin, Fecal 1,036 (H) 0 - 120 ug/g Final    Comment: (NOTE) Concentration     Interpretation   Follow-Up <16 - 50 ug/g     Normal           None >50 -120 ug/g     Borderline       Re-evaluate in 4-6 weeks    >120 ug/g     Abnormal         Repeat as clinically                                   indicated Performed At: [facility] [place], [place] [id number] Rush Farmer MD FG:[id number]      Labs: Basic Metabolic Panel: Recent Labs  Lab 10/12/17 1708 10/13/17 0640 10/14/17 0600 10/15/17 0555 10/16/17 0551 10/17/17 0513 10/18/17 0538  NA  --  140 136 139 141 142 141  K  --  4.0 4.5 4.3 4.1 3.9 3.4*  CL  --  104 103 105 107 107 105  CO2  --  26 25 26 27 28 28   GLUCOSE  --  115* 124* 130* 120* 122* 82  BUN  --  13 12 13 12 15 14   CREATININE  --  1.09* 0.83 0.90 0.80 0.79 0.74  CALCIUM  --  8.7* 8.6* 9.3 9.1 9.3 8.8*  MG 1.8 1.7 2.1 2.0  --   --   --   PHOS 3.8 3.1  --   --   --   --   --    Liver Function Tests: Recent Labs  Lab 10/12/17 0900 10/13/17 0640  AST 16 13*  ALT 14 12  ALKPHOS 100 90  BILITOT 0.7 0.6  PROT 6.6 5.5*  ALBUMIN 2.7* 2.1*   Recent Labs  Lab 10/12/17 0900  LIPASE 22   No results for input(s): AMMONIA in the  last 168 hours. CBC: Recent Labs  Lab 10/12/17 0900 10/13/17 0640 10/14/17 0600 10/15/17 0555 10/16/17 0551 10/17/17 0513 10/18/17 0538  WBC 12.9* 9.2 8.1 9.1 8.8 8.8 11.2*  NEUTROABS 9.5  6.3 6.7 6.5 6.1  --   --   HGB 14.4 11.9* 11.0* 11.6* 10.4* 10.0* 10.2*  HCT 42.1 35.6* 33.1* 35.5* 32.0* 30.6* 30.7*  MCV 90.1 91.3 91.9 91.7 92.2 92.4 92.5  PLT 555* 479* 439* 566* 467* 457* 418*   Cardiac Enzymes: No results for input(s): CKTOTAL, CKMB, CKMBINDEX, TROPONINI in the last 168 hours. BNP: BNP (last 3 results) No results for input(s): BNP in the last 8760 hours.  ProBNP (last 3 results) No results for input(s): PROBNP in the last 8760 hours.  CBG: Recent Labs  Lab 10/14/17 0741 10/15/17 0730 10/16/17 0815 10/17/17 0745 10/18/17 0744  GLUCAP 110* 118* 93 93 74       Signed:  Irine Seal MD.  [facility] 10/18/2017, 4:00 PM

## 2017-10-18 NOTE — Telephone Encounter (Signed)
Patient scheduled with J[facility], as there are no available office appointments until October. Please let me know what biologic therapy and I will start the prior authorizations process. Thank you.

## 2017-10-19 NOTE — Telephone Encounter (Addendum)
[name],  TB Quant gold test negative. Immune to hepatitis B.  When she is seen in clinic follow up, she should be referred to primary care for a Shingrix vaccine.  I plan to use Humira for her.  Planned dosing:  160 mg loading dose, then 80 mg two weeks later, then 40 mg weekly (rather than every other week).  The reason for increased frequency of maintenance dosing is that she has severe colitis that we need to get under control very soon.  [name] - please talk with me when you see her, then we can plan prednisone taper.  I would like her to stay on 40 mg once daily dosing until clinic follow up on 9/9.  Continue cacium supplement daily as well since on steroids.

## 2017-10-20 ENCOUNTER — Encounter: Payer: Self-pay | Admitting: Gastroenterology

## 2017-10-20 ENCOUNTER — Telehealth: Payer: Self-pay

## 2017-10-20 ENCOUNTER — Other Ambulatory Visit: Payer: Self-pay

## 2017-10-20 MED ORDER — ADALIMUMAB 40 MG/0.4ML ~~LOC~~ AJKT: 40.0000 mg | AUTO-INJECTOR | SUBCUTANEOUS | 11 refills | Status: DC

## 2017-10-20 NOTE — Telephone Encounter (Signed)
Patient advised of lab results and plan to continue on with prednisone 40 mg daily until she sees [place] [facility] on 9/9. Patient understands to continue taking her calcium supplement. Informed patient that I will be sending in the information to [facility], they will start precert for Humira will also fax in form to the [facility] nurse ambassador program, discussed with patient what that entails. Patient most appreciative and understands to contact our office if she has questions or concerns.

## 2017-10-20 NOTE — Telephone Encounter (Signed)
Prior authorization faxed to [employer] and [employer].

## 2017-10-21 LAB — THIOPURINE METHYLTRANSFERASE (TPMT), RBC: TPMT ACTIVITY: 8.6 U/mL{RBCs}

## 2017-10-22 LAB — QUANTIFERON-TB GOLD PLUS (RQFGPL)
QUANTIFERON TB2 AG VALUE: 0.06 [IU]/mL
QuantiFERON Mitogen Value: 1.19 IU/mL
QuantiFERON Nil Value: 0.06 IU/mL
QuantiFERON TB1 Ag Value: 0.06 IU/mL

## 2017-10-22 LAB — QUANTIFERON-TB GOLD PLUS: QuantiFERON-TB Gold Plus: NEGATIVE

## 2017-10-29 ENCOUNTER — Other Ambulatory Visit: Payer: Self-pay

## 2017-10-29 ENCOUNTER — Telehealth: Payer: Self-pay | Admitting: Gastroenterology

## 2017-10-29 DIAGNOSIS — K51918 Ulcerative colitis, unspecified with other complication: Secondary | ICD-10-CM

## 2017-10-29 MED ORDER — ADALIMUMAB 40 MG/0.4ML ~~LOC~~ AJKT: 40.0000 mg | AUTO-INJECTOR | SUBCUTANEOUS | 11 refills | Status: DC

## 2017-10-29 NOTE — Telephone Encounter (Signed)
Spoke with the patient. She will be getting her Humira through [employer]. Requests a new Rx be sent to them. Phone number is [phone] fax [phone] Printed and faxed prescription and copy of the insurance.

## 2017-11-01 ENCOUNTER — Ambulatory Visit: Payer: BLUE CROSS/BLUE SHIELD | Admitting: Physician Assistant

## 2017-11-01 ENCOUNTER — Encounter: Payer: Self-pay | Admitting: Physician Assistant

## 2017-11-01 VITALS — BP 118/64 | HR 116 | Ht 65.5 in | Wt 221.0 lb

## 2017-11-01 DIAGNOSIS — K51019 Ulcerative (chronic) pancolitis with unspecified complications: Secondary | ICD-10-CM | POA: Diagnosis not present

## 2017-11-01 NOTE — Progress Notes (Signed)
Chief Complaint: Follow-up hospitalization for ulcerative colitis flare  HPI:    [name] is a 62 year old female with a past medical history as listed below including ulcerative colitis, who is known to Dr. Loletha Carrow and presents to clinic today for follow-up after recently being seen in the hospital by our service for an ulcerative colitis flare.    10/12/2017 consult for ulcerative colitis flare, patient described multiple loose bowel movements per day, 50% of which were bloody with lower abdominal pain as well as some nausea and a few episodes of vomiting.  CT of the abdomen and pelvis at that time showed the entire colonic mucosa appeared somewhat edematous, suspicious for diffuse colitis.  10/13/2017 flex sigmoidoscopy revealed rectal tenderness and nonthrombosed external hemorrhoids as well as pan ulcerative colitis, inflammation was found from the anus to the transverse colon which was severe.  Patient was placed on Solu-Medrol 20 mg every 8 hours.  Patient improved over the 6 days she was in the hospital and was discharged on Prednisone 40 mg daily.    10/19/2017 note from Dr. Loletha Carrow shows that patient had a TB quant gold test which was negative and is immune to hepatitis B.  Plans are to start her on Humira with 160 mg loading dose, then 80 mg 2 weeks later, then 40 mg weekly due to severe colitis.  It was also recommended she stay on her calcium supplement daily since she is on steroids.    Today, reports complete relief of symptoms.  Currently having 3 bowel movements a day which are formed and there is no blood.  No further abdominal pain or nausea.  Just today patient went from prednisone 40 mg down to 30 mg.  Tells me she has altered her diet and is more careful about what she eats trying more healthy foods and also expresses having a lot more energy while on the Prednisone.  Patient is very happy.    Denies fever, chills or weight loss.  Past Medical History:  Diagnosis Date  . Arthritis     . C. difficile diarrhea   . Heart attack ([place]) 10/24/2008  . HLD (hyperlipidemia)   . HTN (hypertension)   . UC (ulcerative colitis) [facility])     Past Surgical History:  Procedure Laterality Date  . BIOPSY  10/13/2017   Procedure: BIOPSY;  Surgeon: Rush Landmark Telford Nab., MD;  Location: [facility];  Service: Gastroenterology;;  . CESAREAN SECTION     x 3  . COLONOSCOPY WITH PROPOFOL N/A 10/13/2017   Procedure: COLONOSCOPY WITH PROPOFOL;  Surgeon: Irving Copas., MD;  Location: [facility];  Service: Gastroenterology;  Laterality: N/A;  . TONSILLECTOMY     age 36  . TOTAL KNEE ARTHROPLASTY Bilateral 10/22/2008    Current Outpatient Medications  Medication Sig Dispense Refill  . acetaminophen (TYLENOL) 500 MG tablet Take 500 mg by mouth every 6 (six) hours as needed (For arthritis pain.).     . Adalimumab (HUMIRA PEN) 40 MG/0.4ML PNKT Inject 40 mg into the skin once a week. 4 each 11  . Calcium Carb-Cholecalciferol (CALCIUM-VITAMIN D) 600-400 MG-UNIT TABS Take 1 tablet by mouth daily.    Marland Kitchen dicyclomine (BENTYL) 10 MG capsule Take 1 capsule (10 mg total) by mouth every 8 (eight) hours as needed for spasms. 90 capsule 0  . hydrocortisone (ANUSOL-HC) 25 MG suppository Place 1 suppository (25 mg total) rectally 2 (two) times daily. 60 suppository 0  . metoprolol succinate (TOPROL-XL) 50 MG 24 hr tablet Take 50  mg by mouth daily. Take with or immediately following a meal.    . Multiple Vitamin (MULTIVITAMIN) tablet Take 1 tablet by mouth daily.    . ondansetron (ZOFRAN) 4 MG tablet Take 1 tablet (4 mg total) by mouth every 8 (eight) hours as needed for nausea or vomiting. 30 tablet 1  . oxyCODONE-acetaminophen (PERCOCET/ROXICET) 5-325 MG tablet Take 1-2 tablets by mouth every 4 (four) hours as needed for moderate pain or severe pain. 15 tablet 0  . predniSONE (DELTASONE) 20 MG tablet Take 1-2 tablets (20-40 mg total) by mouth daily with breakfast. Take 2 tablets (40mg ) daily x 14  days, then 1.5 tablets(30mg ) daily x 14 days, then 1 tablets (20mg ) daily 90 tablet 0   No current facility-administered medications for this visit.     Allergies as of 11/01/2017 - Review Complete 10/13/2017  Allergen Reaction Noted  . Prednisone Other (See Comments) 10/08/2017    Family History  Problem Relation Age of Onset  . Stroke Mother   . Congestive Heart Failure Father   . Obesity Sister   . Hypertension Sister   . Diabetes Brother   . Thyroid disease Daughter     Social History   Socioeconomic History  . Marital status: Unknown    Spouse name: Not on file  . Number of children: 3  . Years of education: Not on file  . Highest education level: Not on file  Occupational History  . Occupation: LPN  Social Needs  . Financial resource strain: Not hard at all  . Food insecurity:    Worry: Never true    Inability: Never true  . Transportation needs:    Medical: No    Non-medical: No  Tobacco Use  . Smoking status: Never Smoker  . Smokeless tobacco: Never Used  Substance and Sexual Activity  . Alcohol use: Not Currently  . Drug use: Never  . Sexual activity: Not on file  Lifestyle  . Physical activity:    Days per week: 0 days    Minutes per session: 0 min  . Stress: Only a little  Relationships  . Social connections:    Talks on phone: More than three times a week    Gets together: Three times a week    Attends religious service: More than 4 times per year    Active member of club or organization: Yes    Attends meetings of clubs or organizations: More than 4 times per year    Relationship status: Widowed  . Intimate partner violence:    Fear of current or ex partner: Not on file    Emotionally abused: Not on file    Physically abused: Not on file    Forced sexual activity: Not on file  Other Topics Concern  . Not on file  Social History Narrative  . Not on file    Review of Systems:    Constitutional: No weight loss, fever or  chills Cardiovascular: No chest pain Respiratory: No SOB  Gastrointestinal: See HPI and otherwise negative   Physical Exam:  Vital signs: BP 118/64   Pulse (!) 116   Ht 5' 5.5" (1.664 m)   Wt 221 lb (100.2 kg)   BMI 36.22 kg/m    Constitutional:   Pleasant female appears to be in NAD, Well developed, Well nourished, alert and cooperative Respiratory: Respirations even and unlabored. Lungs clear to auscultation bilaterally.   No wheezes, crackles, or rhonchi.  Cardiovascular: Normal S1, S2. No MRG. Regular rate and  rhythm. No peripheral edema, cyanosis or pallor.  Gastrointestinal:  Soft, nondistended, nontender. No rebound or guarding. Normal bowel sounds. No appreciable masses or hepatomegaly. Psychiatric: Demonstrates good judgement and reason without abnormal affect or behaviors.  RELEVANT LABS AND IMAGING: CBC    Component Value Date/Time   WBC 11.2 (H) 10/18/2017 0538   RBC 3.32 (L) 10/18/2017 0538   HGB 10.2 (L) 10/18/2017 0538   HCT 30.7 (L) 10/18/2017 0538   PLT 418 (H) 10/18/2017 0538   MCV 92.5 10/18/2017 0538   MCH 30.7 10/18/2017 0538   MCHC 33.2 10/18/2017 0538   RDW 14.5 10/18/2017 0538   LYMPHSABS 1.2 10/16/2017 0551   MONOABS 1.5 (H) 10/16/2017 0551   EOSABS 0.0 10/16/2017 0551   BASOSABS 0.0 10/16/2017 0551    CMP     Component Value Date/Time   NA 141 10/18/2017 0538   K 3.4 (L) 10/18/2017 0538   CL 105 10/18/2017 0538   CO2 28 10/18/2017 0538   GLUCOSE 82 10/18/2017 0538   BUN 14 10/18/2017 0538   CREATININE 0.74 10/18/2017 0538   CALCIUM 8.8 (L) 10/18/2017 0538   PROT 5.5 (L) 10/13/2017 0640   ALBUMIN 2.1 (L) 10/13/2017 0640   AST 13 (L) 10/13/2017 0640   ALT 12 10/13/2017 0640   ALKPHOS 90 10/13/2017 0640   BILITOT 0.6 10/13/2017 0640   GFRNONAA >60 10/18/2017 0538   GFRAA >60 10/18/2017 0538    Assessment: 1.  Ulcerative colitis flare: Patient with complete relief of symptoms, completed 2 weeks of prednisone 40 mg outpatient, Humira  has been ordered, we will likely hear about it later this week  Plan: 1.  Patient does need to see her PCP in regards to having a Shingrix vaccine 2.  Recommend patient continue her calcium and vitamin D supplement while on steroids 3.  Discussed case with Dr. Loletha Carrow who recommends patient do a slow taper of her Prednisone.  She can decrease to 35 mg/day for the next 7 days, then decrease by 5 mg/week until she is finished 4.  Discussed with the patient that we should hear later this week in regards to her Humira approval, hopefully she can start this early next week 5.  Patient to follow in clinic with Dr. Loletha Carrow in 2- 3 months or sooner if necessary.  Tara Newer, PA-C [facility] 11/01/2017, 3:15 PM

## 2017-11-01 NOTE — Patient Instructions (Signed)
Continue your prednisone 40 mg daily for now

## 2017-11-02 ENCOUNTER — Telehealth: Payer: Self-pay

## 2017-11-02 NOTE — Progress Notes (Signed)
Thank you for sending this case to me. I have reviewed the entire note, and the outlined plan is what we discussed.  I will have nursing make sure she has follow -up with me in several weeks, and that Humira is started.  Wilfrid Lund, MD

## 2017-11-02 NOTE — Telephone Encounter (Signed)
On 10/20/17 prior authorization was sent to [facility],, on 10/22/17 they needed TB results faxed to them. 10/29/17 prescription transferred to [facility], information faxed to them at: [phone]. Called [facility] today to check on status of prior auth. They informed me that they just received it yesterday and it is still in the benefit investigation stage. They will let us and the patient know if it is approved or not, [facility] will make arrangements with patient on shipping if approved. [facility] phone#: [phone].

## 2017-11-08 ENCOUNTER — Telehealth: Payer: Self-pay

## 2017-11-08 NOTE — Telephone Encounter (Signed)
Spoke to [employer] they called to verify Rx on Humira, patient will need loading dose and then WEEKLY injections for maintenance dosage due to severity. They are still working on prior authorization.

## 2017-11-12 ENCOUNTER — Telehealth: Payer: Self-pay

## 2017-11-12 ENCOUNTER — Other Ambulatory Visit: Payer: Self-pay

## 2017-11-12 DIAGNOSIS — K51918 Ulcerative colitis, unspecified with other complication: Secondary | ICD-10-CM

## 2017-11-12 MED ORDER — ADALIMUMAB 40 MG/0.4ML ~~LOC~~ AJKT: 40.0000 mg | AUTO-INJECTOR | SUBCUTANEOUS | 11 refills | Status: AC

## 2017-11-12 NOTE — Telephone Encounter (Signed)
Yes, that is the plan.  I will sign a new prescription if needed.

## 2017-11-12 NOTE — Telephone Encounter (Signed)
Spoke to patient to let her know that [employer] denied Humira weekly but they did approve the starter dose kit and then Humira 40 mg maintenance every other week. Spoke to [employer]. at [place] who confirmed this information. I called [facility] and they requested a new Rx be faxed to them so that they can send out the maintenance dose. Patient states that the pharmacy contacted her today too, and she will receive her first dose on 11/16/17. She understands to contact her Humira Ambassador to set up a time to start her training.

## 2017-11-18 ENCOUNTER — Other Ambulatory Visit: Payer: Self-pay

## 2017-11-18 MED ORDER — ADALIMUMAB 40 MG/0.8ML ~~LOC~~ AJKT: 40.0000 mg | AUTO-INJECTOR | SUBCUTANEOUS | 0 refills | Status: AC

## 2017-11-18 MED ORDER — ADALIMUMAB 40 MG/0.8ML ~~LOC~~ AJKT: 40.0000 mg | AUTO-INJECTOR | SUBCUTANEOUS | 0 refills | Status: DC

## 2017-11-18 NOTE — Telephone Encounter (Signed)
[facility] calling stating if pt needs starter dose kit they have to have a separate prescription for it faxed to [phone].

## 2017-11-18 NOTE — Telephone Encounter (Signed)
Done

## 2017-11-22 ENCOUNTER — Other Ambulatory Visit: Payer: Self-pay

## 2017-11-26 ENCOUNTER — Telehealth: Payer: Self-pay | Admitting: Gastroenterology

## 2017-11-26 NOTE — Telephone Encounter (Signed)
Spoke to patient and she did receive a call back from [place] [facility] and it was their error. Humira will be shipped on schedule.

## 2017-11-26 NOTE — Telephone Encounter (Signed)
Patient states she keeps getting calls from [facility] stating she will not receive any humira after 10.11.19 due to there not being a prior auth done. Patient states she know our office did a prior auth but wants to make sure there is nothing else she needs to do and that she is good to continue the humira.

## 2017-12-01 ENCOUNTER — Telehealth: Payer: Self-pay | Admitting: Gastroenterology

## 2017-12-01 NOTE — Telephone Encounter (Signed)
Called [facility] spoke to Freight forwarder, Rip Harbour, she states that there is a problem with the prior authorization. It sounded like they were trying to run the starter kit again and send that out to the patient. It should have been the maintenance dose. She said that her prior Josem Kaufmann would need to be done again. Suggested I contact [employer] [phone] who manages the pharmacy prior authorizations.  I did contact them, spoke to [place], explained that we already had prior auth in place. She did confirm that [facility] was trying to run the Rx for the starter dose again. She helped correct what they were doing and states that the maintenance dose will ship out tomorrow 10/10 and patient will receive it on 10/11.

## 2020-05-19 IMAGING — CT CT ABD-PELV W/ CM
2 of 5 series · 16 of 46 positions shown, 18 images · IV contrast (ISOVUE)
Comparison: None.

CLINICAL DATA: Nausea and vomiting for 2 weeks, history of
ulcerative colitis, patient could not drink oral contrast due to
vomiting

EXAM:
CT ABDOMEN AND PELVIS WITH CONTRAST
TECHNIQUE: Multidetector CT imaging of the abdomen and pelvis was performed
using the standard protocol following bolus administration of
intravenous contrast.
CONTRAST:  75mL OMNIPAQUE IOHEXOL 300 MG/ML  SOLN

[Series 2: axial st · axial · 0.75mm/px · z∈[-510,-134]mm · 13 of 87 slices shown, 15 images]
[im 6/87  soft-tissue]
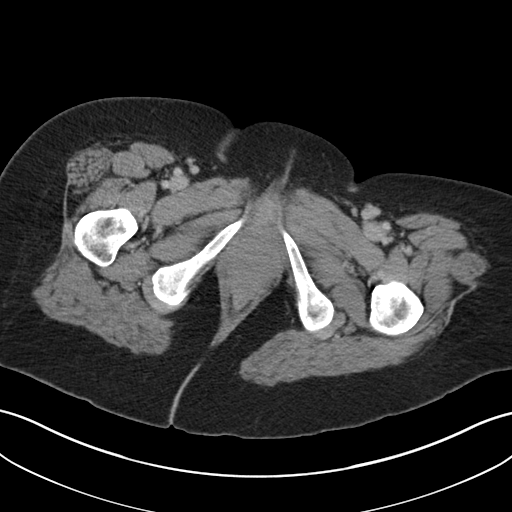
[im 6/87  bone]
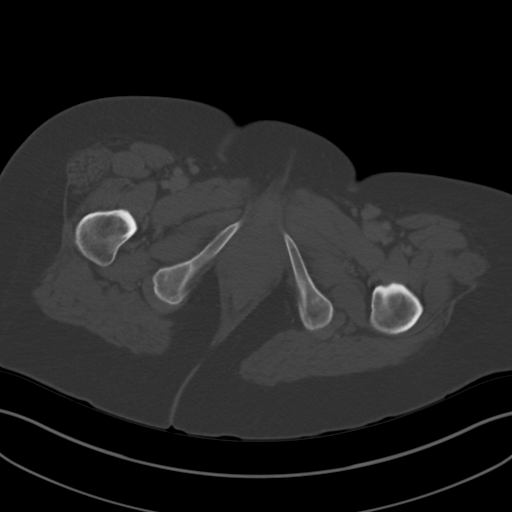
[im 12/87  soft-tissue]
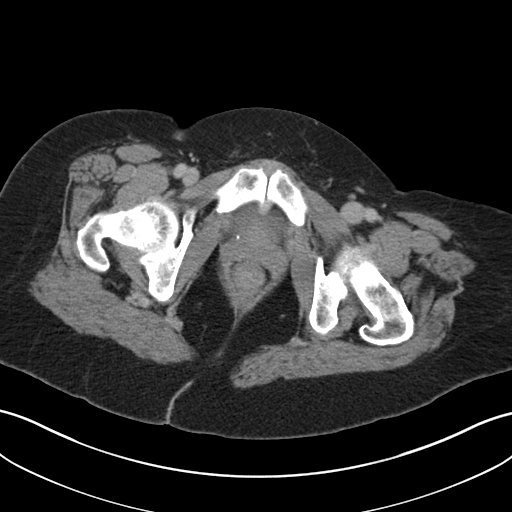
[im 18/87  soft-tissue]
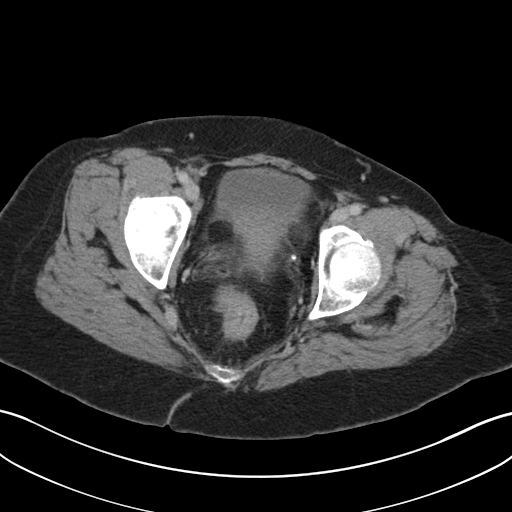
[im 23/87  soft-tissue]
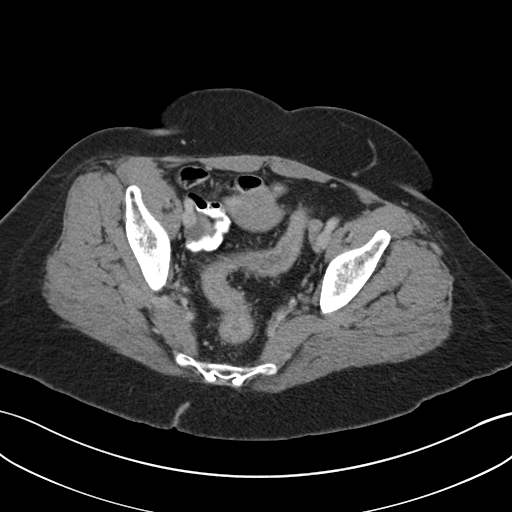
[im 29/87  soft-tissue]
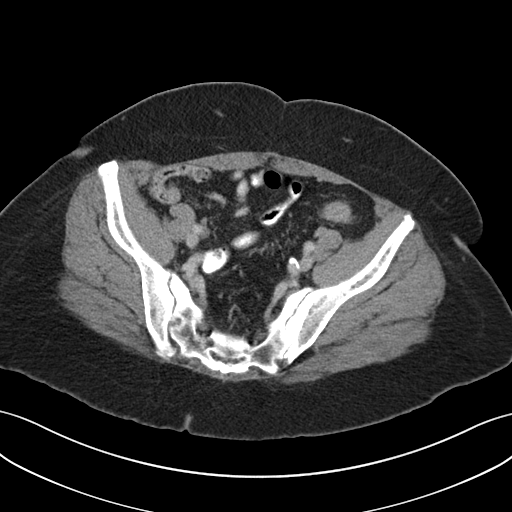
[im 35/87  soft-tissue]
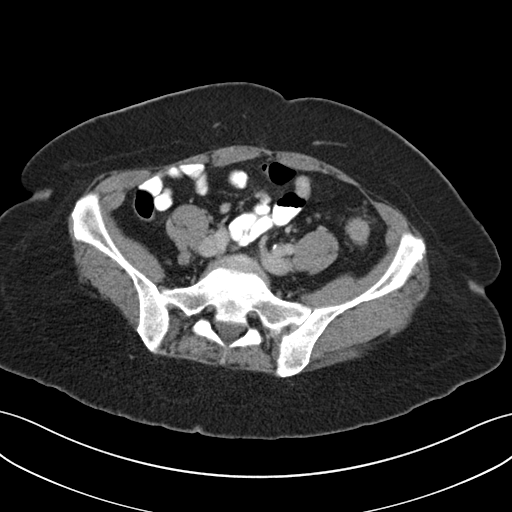
[im 46/87  soft-tissue]
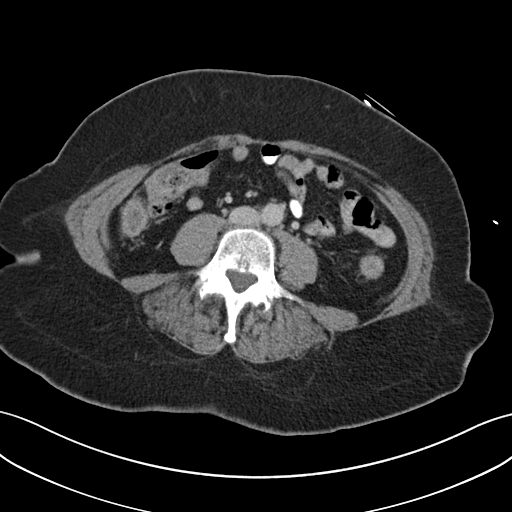
[im 52/87  soft-tissue]
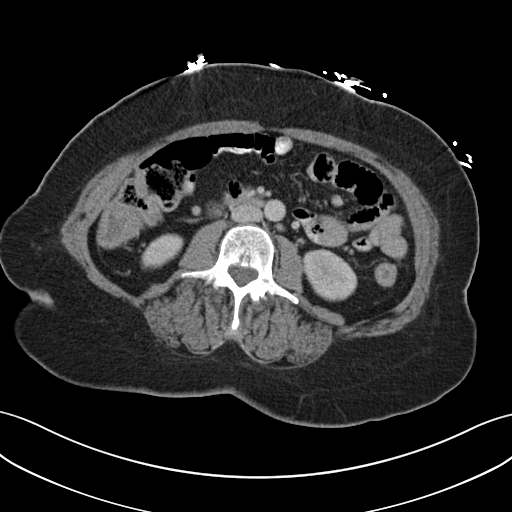
[im 58/87  soft-tissue]
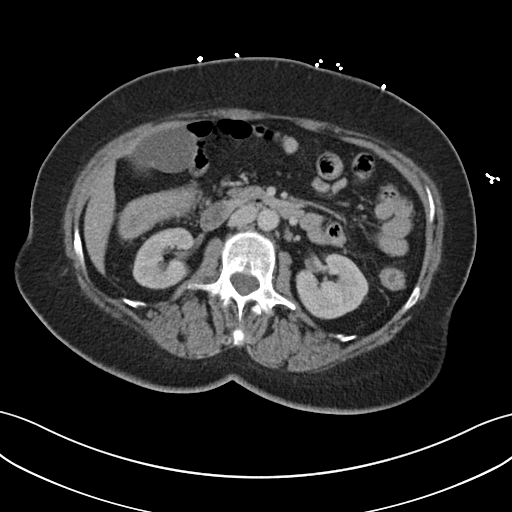
[im 58/87  bone]
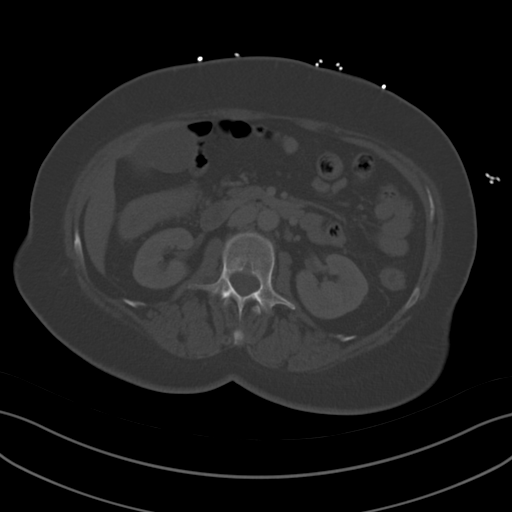
[im 64/87  soft-tissue]
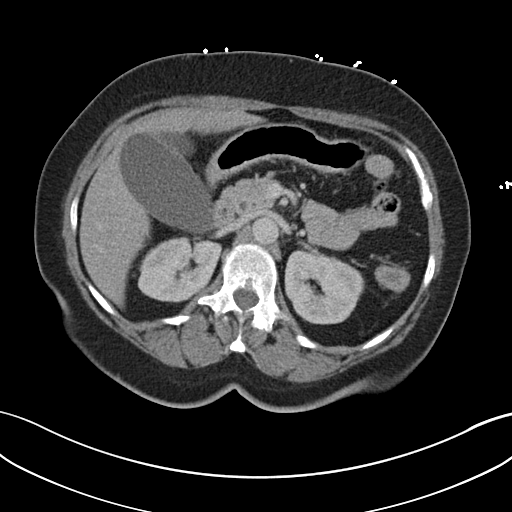
[im 69/87  soft-tissue]
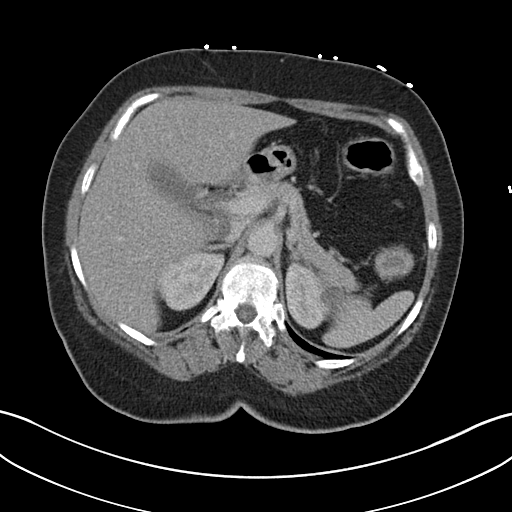
[im 75/87  soft-tissue]
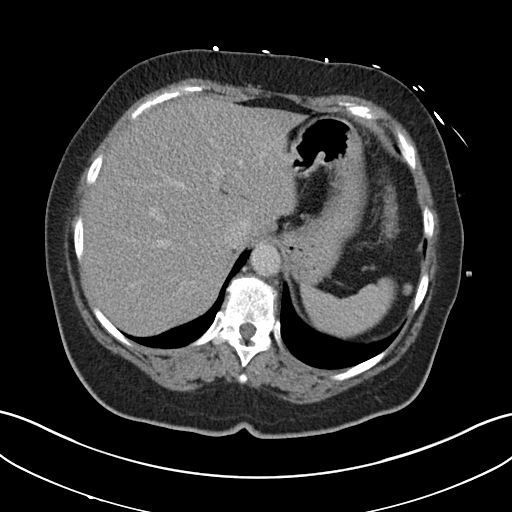
[im 81/87  soft-tissue]
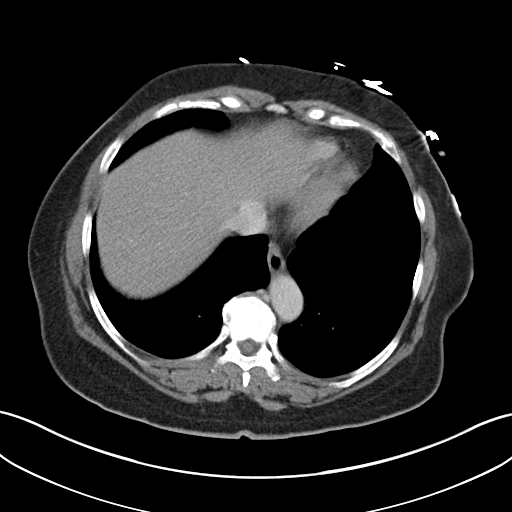

[Series 5: coronal st · coronal · 0.81mm/px · 3 of 101 slices shown]
[im 34/101  soft-tissue]
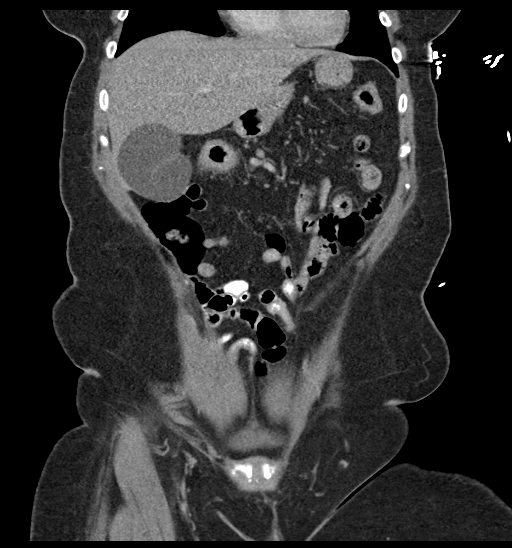
[im 45/101  soft-tissue]
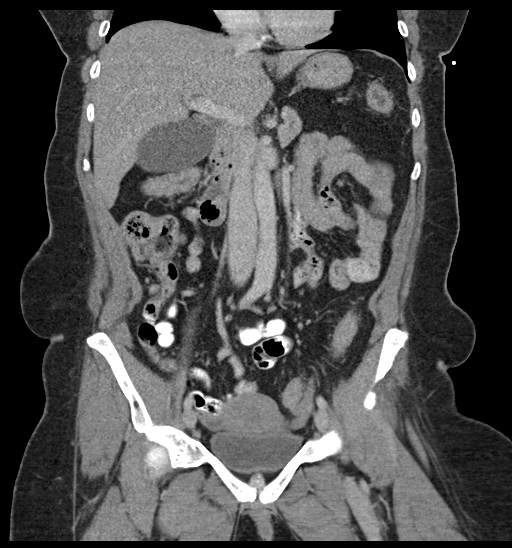
[im 56/101  soft-tissue]
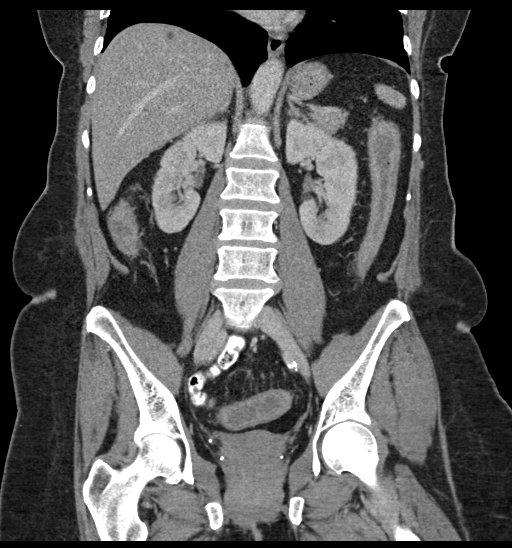

[16 of 46 positions shown; findings below may reference images not displayed]

FINDINGS: Lower chest: The lung bases are clear. The heart is within normal
limits in size. No pericardial effusion is seen.

Hepatobiliary: Tiny subcentimeter low-attenuation structures in the
liver most consistent with a benign process such as cysts. No focal
hepatic abnormality is seen. The gallbladder is slightly distended
but no calcified gallstones are seen.

Pancreas: The pancreas is normal in size and the pancreatic duct is
not dilated.

Spleen: The spleen is unremarkable.

Adrenals/Urinary Tract: The adrenal glands appear normal. The
kidneys enhance with no mass or calculus. On delayed images, the
pelvocaliceal systems are unremarkable. Ureters appear normal in
caliber. The urinary bladder is not well distended but no
abnormality is noted.

Stomach/Bowel: The stomach is completely decompressed and cannot be
well assessed. No abnormality of small bowel is seen. The colon
however is completely decompressed and does appear to be somewhat
edematous diffusely. Diffuse colitis is a definite consideration. No
focal abscess is seen. The terminal ileum and the appendix are
unremarkable.

Vascular/Lymphatic: The abdominal aorta is normal in caliber. No
focal aneurysmal dilatation is seen. No adenopathy is noted.

Reproductive: The uterus is normal in size. No adnexal lesion seen
with probable tiny follicles on the right. No free fluid is seen
within the pelvis.

Other: No abdominal wall hernia is seen.

Musculoskeletal: There degenerative changes involving both SI joints
with sclerosis and irregularity. The lumbar vertebrae are in normal
alignment with intervertebral disc spaces appear grossly normal.
However considerable degenerative change does involve the facet
joints of the mid to lower lumbar spine diffusely.
IMPRESSION: 1. Although the colon is completely decompressed, the entire colonic
mucosa appears somewhat edematous, suspicious for diffuse colitis.
Correlate clinically. No abscess is seen.
2. Degenerative changes throughout the facet joints of the lower
lumbar spine and within the SI joints.

## 2021-12-12 ENCOUNTER — Other Ambulatory Visit: Payer: Self-pay

## 2021-12-12 ENCOUNTER — Encounter (HOSPITAL_COMMUNITY): Payer: Self-pay

## 2021-12-12 ENCOUNTER — Emergency Department (HOSPITAL_COMMUNITY)
Admission: EM | Admit: 2021-12-12 | Discharge: 2021-12-13 | Disposition: A | Discharge status: Home / Self Care | Payer: Medicare Other | Attending: Emergency Medicine | Admitting: Emergency Medicine

## 2021-12-12 DIAGNOSIS — I10 Essential (primary) hypertension: Secondary | ICD-10-CM | POA: Diagnosis not present

## 2021-12-12 DIAGNOSIS — L03116 Cellulitis of left lower limb: Secondary | ICD-10-CM | POA: Diagnosis not present

## 2021-12-12 DIAGNOSIS — Z79899 Other long term (current) drug therapy: Secondary | ICD-10-CM | POA: Insufficient documentation

## 2021-12-12 DIAGNOSIS — M25472 Effusion, left ankle: Secondary | ICD-10-CM

## 2021-12-12 DIAGNOSIS — M1990 Unspecified osteoarthritis, unspecified site: Secondary | ICD-10-CM

## 2021-12-12 DIAGNOSIS — M19072 Primary osteoarthritis, left ankle and foot: Secondary | ICD-10-CM | POA: Diagnosis not present

## 2021-12-12 DIAGNOSIS — L0390 Cellulitis, unspecified: Secondary | ICD-10-CM

## 2021-12-12 LAB — CBC
HCT: 34.7 % — ABNORMAL LOW (ref 36.0–46.0)
Hemoglobin: 11.6 g/dL — ABNORMAL LOW (ref 12.0–15.0)
MCH: 30.8 pg (ref 26.0–34.0)
MCHC: 33.4 g/dL (ref 30.0–36.0)
MCV: 92 fL (ref 80.0–100.0)
Platelets: 391 10*3/uL (ref 150–400)
RBC: 3.77 MIL/uL — ABNORMAL LOW (ref 3.87–5.11)
RDW: 14 % (ref 11.5–15.5)
WBC: 9.3 10*3/uL (ref 4.0–10.5)
nRBC: 0 % (ref 0.0–0.2)

## 2021-12-12 LAB — BASIC METABOLIC PANEL
Anion gap: 10 (ref 5–15)
BUN: 16 mg/dL (ref 8–23)
CO2: 24 mmol/L (ref 22–32)
Calcium: 9.8 mg/dL (ref 8.9–10.3)
Chloride: 104 mmol/L (ref 98–111)
Creatinine, Ser: 0.86 mg/dL (ref 0.44–1.00)
GFR, Estimated: >60 mL/min (ref >60)
Glucose, Bld: 110 mg/dL — ABNORMAL HIGH (ref 70–99)
Potassium: 3.8 mmol/L (ref 3.5–5.1)
Sodium: 138 mmol/L (ref 135–145)

## 2021-12-12 LAB — LACTIC ACID, PLASMA: Lactic Acid, Venous: 1.2 mmol/L (ref 0.5–1.9)

## 2021-12-12 NOTE — ED Provider Triage Note (Signed)
Emergency Medicine Provider Triage Evaluation Note  [name] , a 66 y.o. female  was evaluated in triage.  Pt complains of left lower extremity swelling and redness.  Patient reports that for the last 1 week her left lower extremity has become very swollen.  Patient reports that in the last 1 day the area has become red and hot to the touch.  The patient denies any fevers, nausea or vomiting.  Review of Systems  Positive:  Negative:   Physical Exam  There were no vitals taken for this visit. Gen:   Awake, no distress   Resp:  Normal effort  MSK:   Moves extremities without difficulty  Other:    Medical Decision Making  Medically screening exam initiated at 5:58 PM.  Appropriate orders placed.  Lindyn Vossler was informed that the remainder of the evaluation will be completed by another provider, this initial triage assessment does not replace that evaluation, and the importance of remaining in the ED until their evaluation is complete.     Azucena Cecil, PA-C 12/12/21 1758

## 2021-12-12 NOTE — ED Triage Notes (Signed)
Pt came in POV from home d/t swollen L ankle that started Monday night progressed throughout the week. The Site looks red, swollen and warm to touch. Pt rated the pain 10/10.

## 2021-12-13 ENCOUNTER — Emergency Department (HOSPITAL_COMMUNITY): Payer: Medicare Other

## 2021-12-13 MED ORDER — OXYCODONE-ACETAMINOPHEN 5-325 MG PO TABS
1.0000 | ORAL_TABLET | Freq: Once | ORAL | Status: AC
  Administered 2021-12-13: 1 via ORAL
  Filled 2021-12-13: qty 1

## 2021-12-13 MED ORDER — CEPHALEXIN 500 MG PO CAPS: 500.0000 mg | ORAL_CAPSULE | Freq: Four times a day (QID) | ORAL | 0 refills | Status: DC

## 2021-12-13 NOTE — ED Provider Notes (Signed)
[facility] EMERGENCY DEPARTMENT Provider Note   CSN: [id number] Arrival date & time: 12/12/21  1635     History  Chief Complaint  Patient presents with   Joint Swelling    [name] is a 66 y.o. female with past medical history significant for osteoarthritis, rheumatoid arthritis, ulcerative colitis, hyperlipidemia, hypertension who presents with concern for swollen left ankle that started on Tuesday, swelling worsened over the last several days, yesterday morning she noticed significant redness, and the weeping drainage from the wound.  She reports that it was initially somewhat clear in nature but then became yellowish and thicker.  Patient reports significant pain when trying to ambulate on the ankle but reports that she can tolerate the pain with Tylenol alone when elevating the ankle.  She denies any systemic fever, chills, nausea, vomiting, no previous history of gout, septic arthritis, she is not currently immunosuppressed with Humira.   HPI     Home Medications Prior to Admission medications   Medication Sig Start Date End Date Taking? Authorizing Provider  cephALEXin (KEFLEX) 500 MG capsule Take 1 capsule (500 mg total) by mouth 4 (four) times daily. 12/13/21  Yes Emmersyn Kratzke H, PA-C  cephALEXin (KEFLEX) 500 MG capsule Take 1 capsule (500 mg total) by mouth 4 (four) times daily. 12/13/21  Yes Kannon Baum H, PA-C  acetaminophen (TYLENOL) 500 MG tablet Take 500 mg by mouth every 6 (six) hours as needed (For arthritis pain.).     [provider]  Adalimumab (HUMIRA PEN) 40 MG/0.4ML PNKT Inject 40 mg into the skin every 14 (fourteen) days. After starter kit. 11/12/17   Doran Stabler, MD  Adalimumab (HUMIRA PEN-CD/UC/HS STARTER) 40 MG/0.8ML PNKT Inject 40 mg into the skin as directed. Needs starter pack: Inject 160 mg [place] on day 1, 80 mg [place] on day 15, then maintenance dose. 11/18/17   Doran Stabler, MD  Calcium  Carb-Cholecalciferol (CALCIUM-VITAMIN D) 600-400 MG-UNIT TABS Take 1 tablet by mouth daily.    [provider]  dicyclomine (BENTYL) 10 MG capsule Take 1 capsule (10 mg total) by mouth every 8 (eight) hours as needed for spasms. 10/08/17   Doran Stabler, MD  hydrocortisone (ANUSOL-HC) 25 MG suppository Place 1 suppository (25 mg total) rectally 2 (two) times daily. 10/18/17   Eugenie Filler, MD  metoprolol succinate (TOPROL-XL) 50 MG 24 hr tablet Take 50 mg by mouth daily. Take with or immediately following a meal.    [provider]  Multiple Vitamin (MULTIVITAMIN) tablet Take 1 tablet by mouth daily.    [provider]  ondansetron (ZOFRAN) 4 MG tablet Take 1 tablet (4 mg total) by mouth every 8 (eight) hours as needed for nausea or vomiting. 10/08/17   Doran Stabler, MD  oxyCODONE-acetaminophen (PERCOCET/ROXICET) 5-325 MG tablet Take 1-2 tablets by mouth every 4 (four) hours as needed for moderate pain or severe pain. 10/18/17   Eugenie Filler, MD  predniSONE (DELTASONE) 20 MG tablet Take 1-2 tablets (20-40 mg total) by mouth daily with breakfast. Take 2 tablets (39m) daily x 14 days, then 1.5 tablets(379m daily x 14 days, then 1 tablets (2076mdaily 10/19/17   ThoEugenie FillerD      Allergies    Prednisone    Review of Systems   Review of Systems  Musculoskeletal:  Positive for arthralgias and joint swelling.  All other systems reviewed and are negative.   Physical Exam Updated Vital Signs  BP 121/75   Pulse 95   Temp 97.6 F (36.4 C)   Resp 17   SpO2 95%  Physical Exam Vitals and nursing note reviewed.  Constitutional:      General: She is not in acute distress.    Appearance: Normal appearance.  HENT:     Head: Normocephalic and atraumatic.  Eyes:     General:        Right eye: No discharge.        Left eye: No discharge.  Cardiovascular:     Rate and Rhythm: Normal rate and regular rhythm.     Pulses: Normal pulses.   Pulmonary:     Effort: Pulmonary effort is normal. No respiratory distress.  Musculoskeletal:        General: No deformity.     Comments: Normal, equivalent ROM of left compared to right ankle, intact strength to plantar and dorsiflexion with some effort  Skin:    General: Skin is warm and dry.     Capillary Refill: Capillary refill takes less than 2 seconds.     Comments: Soft tissue swelling, redness, around 71m circular wound weeping clear to yellowish drainage, no tracking redness  Neurological:     Mental Status: She is alert and oriented to person, place, and time.  Psychiatric:        Mood and Affect: Mood normal.        Behavior: Behavior normal.      ED Results / Procedures / Treatments   Labs (all labs ordered are listed, but only abnormal results are displayed) Labs Reviewed  CBC - Abnormal; Notable for the following components:      Result Value   RBC 3.77 (*)    Hemoglobin 11.6 (*)    HCT 34.7 (*)    All other components within normal limits  BASIC METABOLIC PANEL - Abnormal; Notable for the following components:   Glucose, Bld 110 (*)    All other components within normal limits  CULTURE, BLOOD (ROUTINE X 2)  CULTURE, BLOOD (ROUTINE X 2)  LACTIC ACID, PLASMA  URINALYSIS, ROUTINE W REFLEX MICROSCOPIC  LACTIC ACID, PLASMA    EKG None  Radiology DG Ankle Complete Left  Result Date: 12/13/2021 CLINICAL DATA:  66year old female with history of wound on the foot. Swelling of the foot and ankle. EXAM: LEFT ANKLE COMPLETE - 3+ VIEW COMPARISON:  No priors. FINDINGS: Four views of the right ankle demonstrate no definite acute displaced fracture. There is diminutive size of the talus and apparent talocalcaneal coalition. Soft tissue surrounding the foot and ankle are diffusely swollen. No radiopaque foreign body in the soft tissues. IMPRESSION: 1. No acute osseous abnormality of the left ankle. Diffuse soft tissue swelling surrounding the left ankle. 2.  Talocalcaneal coalition. Electronically Signed   By: DVinnie LangtonM.D.   On: 12/13/2021 05:27    Procedures Procedures    Medications Ordered in ED Medications  oxyCODONE-acetaminophen (PERCOCET/ROXICET) 5-325 MG per tablet 1 tablet (has no administration in time range)    ED Course/ Medical Decision Making/ A&P                           Medical Decision Making Amount and/or Complexity of Data Reviewed Radiology: ordered.  Risk Prescription drug management.   This patient is a 66y.o. female who presents to the ED for concern of left ankle swelling, this involves an extensive number of treatment options, and is a complaint  that carries with it a high risk of complications and morbidity. The emergent differential diagnosis prior to evaluation includes, but is not limited to, gout, pseudogout, rheumatoid or osteoarthritis flare, septic joint, systemic sepsis, cellulitis, venous stasis ulcer or other wound, other musculoskeletal injury versus other.   This is not an exhaustive differential.   Past Medical History / Co-morbidities / Social History: osteoarthritis, rheumatoid arthritis, ulcerative colitis, hyperlipidemia, hypertension  Additional history: Chart reviewed. Pertinent results include: Reviewed outpatient ENT, gastro, previous emergency department lab work and imaging  Physical Exam: Physical exam performed. The pertinent findings include: Please see photos in media tab.  Soft tissue swelling, redness, small ulcerated wound with some drainage, patient has intact range of motion at the ankle, low clinical concern for septic joint.  Lab Tests: I ordered, and personally interpreted labs.  The pertinent results include: Blood culture that were drawn yesterday when patient arrives is normal with no growth today which is reassuring.  Lactic acid x1 normal.  CBC notable for very mild hemoglobin deficit/anemia, hemoglobin 11.6.  Leukocytosis not noted, white blood cells 9.3.   BMP is overall unremarkable, very mild hyperglycemia with glucose 110.   Imaging Studies: I ordered imaging studies including plain film radiograph of the left ankle. I independently visualized and interpreted imaging which showed diffuse soft tissue swelling but no evidence of joint effusion, or other fracture or dislocation. I agree with the radiologist interpretation.  Medications: I ordered medication including Percocet for pain. Reevaluation of the patient after these medicines showed that the patient improved. I have reviewed the patients home medicines and have made adjustments as needed.  We will discharge with Keflex for skin soft tissue infection, patient without significant comorbidities to suggest MRSA infection, discussed however strict return precaution if worsening despite antibiotic treatment patient may need stronger antibioticsand cycling.   Disposition: After consideration of the diagnostic results and the patients response to treatment, I feel that patient's clinical condition is consistent with possible arthritis flare leading to soft tissue swelling, and an overlying cellulitis, low clinical suspicion for septic arthritis given the physical exam findings and patient presentation.  Will provide crutches for comfort, encouraged continued elevation of the extremity.   emergency department workup does not suggest an emergent condition requiring admission or immediate intervention beyond what has been performed at this time. The plan is: as above. The patient is safe for discharge and has been instructed to return immediately for worsening symptoms, change in symptoms or any other concerns.  I discussed this case with my attending physician Dr. Alvino Chapel who cosigned this note including patient's presenting symptoms, physical exam, and planned diagnostics and interventions. Attending physician stated agreement with plan or made changes to plan which were implemented.    Final Clinical  Impression(s) / ED Diagnoses Final diagnoses:  Cellulitis, unspecified cellulitis site  Left ankle swelling  Arthritis    Rx / DC Orders ED Discharge Orders          Ordered    cephALEXin (KEFLEX) 500 MG capsule  4 times daily        12/13/21 1308    cephALEXin (KEFLEX) 500 MG capsule  4 times daily        12/13/21 1346              Destinae Neubecker, [place], PA-C 12/13/21 1346    Davonna Belling, MD 12/13/21 1524

## 2021-12-13 NOTE — Progress Notes (Signed)
Orthopedic Tech Progress Note Patient Details:  [name] Sep 11, 1955 [id number]  Ortho Devices Type of Ortho Device: Crutches Ortho Device/Splint Interventions: Ordered, Adjustment      Linus Salmons Analese Sovine 12/13/2021, 1:26 PM

## 2021-12-13 NOTE — Discharge Instructions (Addendum)
Please use Tylenol for pain.  You may use 1000 mg of Tylenol every 6 hours.  Not to exceed 4 g of Tylenol within 24 hours.  Please take the entire course of antibiotics that I prescribed, you need to take the antibiotics 4 times daily for proper effect.  If the swelling, redness, pain, and pus draining from your wound is worsening instead of improving after 2 to 3 days I recommend that you return to the emergency department, urgent care, or primary care doctor for further evaluation, potentially stronger antibiotics.  In the meantime take some time to rest, keep your legs elevated, you can use the crutches as needed for comfort, and return to normal ambulation as tolerated.

## 2021-12-17 LAB — CULTURE, BLOOD (ROUTINE X 2)
Culture: NO GROWTH
Special Requests: ADEQUATE

## 2022-02-26 ENCOUNTER — Other Ambulatory Visit: Payer: Self-pay | Admitting: Geriatric Medicine

## 2022-02-26 DIAGNOSIS — Z1231 Encounter for screening mammogram for malignant neoplasm of breast: Secondary | ICD-10-CM

## 2022-04-21 ENCOUNTER — Ambulatory Visit: Payer: Medicare Other | Admitting: Podiatry

## 2022-04-21 ENCOUNTER — Ambulatory Visit (INDEPENDENT_AMBULATORY_CARE_PROVIDER_SITE_OTHER): Payer: Medicare Other

## 2022-04-21 DIAGNOSIS — M778 Other enthesopathies, not elsewhere classified: Secondary | ICD-10-CM | POA: Diagnosis not present

## 2022-04-21 MED ORDER — TRIAMCINOLONE ACETONIDE 40 MG/ML IJ SUSP
40.0000 mg | Freq: Once | INTRAMUSCULAR | Status: AC
  Administered 2022-04-21: 40 mg

## 2022-04-21 NOTE — Progress Notes (Signed)
Subjective:  Patient ID: [name], female    DOB: 08-03-1955,  MRN: [id number] HPI Chief Complaint  Patient presents with   Bunions    Bilateral bunions, patient started having more pain in the last 2 months, sharp, right foot is worse than the left     67 y.o. female presents with the above complaint.   ROS: Denies fever chills nausea vomiting muscle aches pains calf pain back pain chest pain shortness of breath.  Past Medical History:  Diagnosis Date   Arthritis    C. difficile diarrhea    Heart attack ([place]) 10/24/2008   HLD (hyperlipidemia)    HTN (hypertension)    UC (ulcerative colitis) ([place])    Past Surgical History:  Procedure Laterality Date   BIOPSY  10/13/2017   Procedure: BIOPSY;  Surgeon: Irving Copas., MD;  Location: [facility];  Service: Gastroenterology;;   CESAREAN SECTION     x 3   COLONOSCOPY WITH PROPOFOL N/A 10/13/2017   Procedure: COLONOSCOPY WITH PROPOFOL;  Surgeon: Irving Copas., MD;  Location: [facility];  Service: Gastroenterology;  Laterality: N/A;   TONSILLECTOMY     age 48   TOTAL KNEE ARTHROPLASTY Bilateral 10/22/2008    Current Outpatient Medications:    acetaminophen (TYLENOL) 500 MG tablet, Take 500 mg by mouth every 6 (six) hours as needed (For arthritis pain.). , Disp: , Rfl:    Adalimumab (HUMIRA PEN) 40 MG/0.4ML PNKT, Inject 40 mg into the skin every 14 (fourteen) days. After starter kit., Disp: 2 each, Rfl: 11   Adalimumab (HUMIRA PEN-CD/UC/HS STARTER) 40 MG/0.8ML PNKT, Inject 40 mg into the skin as directed. Needs starter pack: Inject 160 mg [place] on day 1, 80 mg [place] on day 15, then maintenance dose., Disp: 1 each, Rfl: 0   Calcium Carb-Cholecalciferol (CALCIUM-VITAMIN D) 600-400 MG-UNIT TABS, Take 1 tablet by mouth daily., Disp: , Rfl:    cephALEXin (KEFLEX) 500 MG capsule, Take 1 capsule (500 mg total) by mouth 4 (four) times daily., Disp: 28 capsule, Rfl: 0   cephALEXin (KEFLEX) 500 MG capsule, Take 1 capsule (500  mg total) by mouth 4 (four) times daily., Disp: 28 capsule, Rfl: 0   dicyclomine (BENTYL) 10 MG capsule, Take 1 capsule (10 mg total) by mouth every 8 (eight) hours as needed for spasms., Disp: 90 capsule, Rfl: 0   hydrocortisone (ANUSOL-HC) 25 MG suppository, Place 1 suppository (25 mg total) rectally 2 (two) times daily., Disp: 60 suppository, Rfl: 0   metoprolol succinate (TOPROL-XL) 50 MG 24 hr tablet, Take 50 mg by mouth daily. Take with or immediately following a meal., Disp: , Rfl:    Multiple Vitamin (MULTIVITAMIN) tablet, Take 1 tablet by mouth daily., Disp: , Rfl:    ondansetron (ZOFRAN) 4 MG tablet, Take 1 tablet (4 mg total) by mouth every 8 (eight) hours as needed for nausea or vomiting., Disp: 30 tablet, Rfl: 1   oxyCODONE-acetaminophen (PERCOCET/ROXICET) 5-325 MG tablet, Take 1-2 tablets by mouth every 4 (four) hours as needed for moderate pain or severe pain., Disp: 15 tablet, Rfl: 0   predniSONE (DELTASONE) 20 MG tablet, Take 1-2 tablets (20-40 mg total) by mouth daily with breakfast. Take 2 tablets ('40mg'$ ) daily x 14 days, then 1.5 tablets('30mg'$ ) daily x 14 days, then 1 tablets ('20mg'$ ) daily, Disp: 90 tablet, Rfl: 0  Allergies  Allergen Reactions   Prednisone Other (See Comments)    Tachycardia, nervous, sweating , jitters   Review of Systems Objective:  There were no vitals  filed for this visit.  General: Well developed, nourished, in no acute distress, alert and oriented x3   Dermatological: Skin is warm, dry and supple bilateral. Nails x 10 are well maintained; remaining integument appears unremarkable at this time. There are no open sores, no preulcerative lesions, no rash or signs of infection present.  Vascular: Dorsalis Pedis artery and Posterior Tibial artery pedal pulses are 2/4 bilateral with immedate capillary fill time. Pedal hair growth present. No varicosities and no lower extremity edema present bilateral.   Neruologic: Grossly intact via light touch bilateral.  Vibratory intact via tuning fork bilateral. Protective threshold with Semmes Wienstein monofilament intact to all pedal sites bilateral. Patellar and Achilles deep tendon reflexes 2+ bilateral. No Babinski or clonus noted bilateral.   Musculoskeletal: No gross boney pedal deformities bilateral. No pain, crepitus, or limitation noted with foot and ankle range of motion bilateral. Muscular strength 5/5 in all groups tested bilateral.  Pes planovalgus rigid rear foot deformity left flexible deformity right hallux valgus deformity rigid bilateral right greater than left with pain on frontal plane range of motion of the tarsometatarsal joints.  Gait: Unassisted, Nonantalgic.    Radiographs:  Radiographs taken today demonstrate an osseously mature individual with considerable generalized demineralization of the bone both feet demonstrating severe pes planovalgus with osteoarthritis of the rear foot left midfoot bilateral and severe hallux valgus deformity with dislocation of the first metatarsophalangeal joint.  Hammertoe deformities noted bilateral appear to be rigid.  Assessment & Plan:   Assessment: Pes planovalgus bilateral.  Severe hallux valgus deformity hammertoe deformities.  Capsulitis midfoot forefoot bilateral.  Plan: Injected bilateral foot today 10 mg of Kenalog 5 mg Marcaine point of maximal tenderness around the first and second metatarsal phalangeal joint bilateral.  Follow-up with her as needed.     Terrianna Holsclaw T. [place], [place]

## 2022-04-22 ENCOUNTER — Ambulatory Visit: Payer: Medicare Other

## 2022-04-28 ENCOUNTER — Emergency Department (HOSPITAL_COMMUNITY)
Admission: EM | Admit: 2022-04-28 | Discharge: 2022-04-29 | Disposition: A | Discharge status: Home / Self Care | Payer: Medicare Other | Attending: Emergency Medicine | Admitting: Emergency Medicine

## 2022-04-28 ENCOUNTER — Encounter (HOSPITAL_COMMUNITY): Payer: Self-pay

## 2022-04-28 ENCOUNTER — Emergency Department (HOSPITAL_COMMUNITY): Payer: Medicare Other

## 2022-04-28 DIAGNOSIS — N132 Hydronephrosis with renal and ureteral calculous obstruction: Secondary | ICD-10-CM | POA: Diagnosis not present

## 2022-04-28 DIAGNOSIS — Z79899 Other long term (current) drug therapy: Secondary | ICD-10-CM | POA: Diagnosis not present

## 2022-04-28 DIAGNOSIS — R000 Tachycardia, unspecified: Secondary | ICD-10-CM | POA: Diagnosis not present

## 2022-04-28 DIAGNOSIS — R1032 Left lower quadrant pain: Secondary | ICD-10-CM | POA: Diagnosis present

## 2022-04-28 DIAGNOSIS — N200 Calculus of kidney: Secondary | ICD-10-CM

## 2022-04-28 LAB — COMPREHENSIVE METABOLIC PANEL
ALT: 24 U/L (ref 0–44)
AST: 30 U/L (ref 15–41)
Albumin: 4.5 g/dL (ref 3.5–5.0)
Alkaline Phosphatase: 97 U/L (ref 38–126)
Anion gap: 8 (ref 5–15)
BUN: 18 mg/dL (ref 8–23)
CO2: 24 mmol/L (ref 22–32)
Calcium: 9.2 mg/dL (ref 8.9–10.3)
Chloride: 102 mmol/L (ref 98–111)
Creatinine, Ser: 0.97 mg/dL (ref 0.44–1.00)
GFR, Estimated: >60 mL/min (ref >60)
Glucose, Bld: 106 mg/dL — ABNORMAL HIGH (ref 70–99)
Potassium: 3.9 mmol/L (ref 3.5–5.1)
Sodium: 134 mmol/L — ABNORMAL LOW (ref 135–145)
Total Bilirubin: 0.6 mg/dL (ref 0.3–1.2)
Total Protein: 8.2 g/dL — ABNORMAL HIGH (ref 6.5–8.1)

## 2022-04-28 LAB — CBC
HCT: 39.8 % (ref 36.0–46.0)
Hemoglobin: 12.9 g/dL (ref 12.0–15.0)
MCH: 30.5 pg (ref 26.0–34.0)
MCHC: 32.4 g/dL (ref 30.0–36.0)
MCV: 94.1 fL (ref 80.0–100.0)
Platelets: 277 10*3/uL (ref 150–400)
RBC: 4.23 MIL/uL (ref 3.87–5.11)
RDW: 14.5 % (ref 11.5–15.5)
WBC: 6.5 10*3/uL (ref 4.0–10.5)
nRBC: 0 % (ref 0.0–0.2)

## 2022-04-28 LAB — LIPASE, BLOOD: Lipase: 39 U/L (ref 11–51)

## 2022-04-28 MED ORDER — SODIUM CHLORIDE 0.9 % IV BOLUS
500.0000 mL | Freq: Once | INTRAVENOUS | Status: AC
  Administered 2022-04-29: 500 mL via INTRAVENOUS

## 2022-04-28 MED ORDER — ONDANSETRON HCL 4 MG/2ML IJ SOLN
4.0000 mg | Freq: Once | INTRAMUSCULAR | Status: AC
  Administered 2022-04-29: 4 mg via INTRAVENOUS
  Filled 2022-04-28: qty 2

## 2022-04-28 MED ORDER — TAMSULOSIN HCL 0.4 MG PO CAPS: 0.4000 mg | ORAL_CAPSULE | Freq: Every day | ORAL | 0 refills | Status: AC

## 2022-04-28 MED ORDER — MORPHINE SULFATE (PF) 4 MG/ML IV SOLN
4.0000 mg | Freq: Once | INTRAVENOUS | Status: AC
  Administered 2022-04-29: 4 mg via INTRAVENOUS
  Filled 2022-04-28: qty 1

## 2022-04-28 MED ORDER — ONDANSETRON 4 MG PO TBDP: 4.0000 mg | ORAL_TABLET | Freq: Three times a day (TID) | ORAL | 0 refills | Status: AC | PRN

## 2022-04-28 MED ORDER — KETOROLAC TROMETHAMINE 30 MG/ML IJ SOLN
15.0000 mg | Freq: Once | INTRAMUSCULAR | Status: AC
  Administered 2022-04-29: 15 mg via INTRAVENOUS
  Filled 2022-04-28: qty 1

## 2022-04-28 MED ORDER — OXYCODONE-ACETAMINOPHEN 5-325 MG PO TABS: 1.0000 | ORAL_TABLET | Freq: Four times a day (QID) | ORAL | 0 refills | Status: AC | PRN

## 2022-04-28 NOTE — ED Notes (Signed)
Pt requesting pain medication.  

## 2022-04-28 NOTE — Discharge Instructions (Signed)
You were seen in the emergency department for some left-sided abdominal pain nausea vomiting.  You do have a 4 mm kidney stone on the left that likely is the cause of your symptoms.  Please drink plenty of fluids and we are prescribing you some nausea and pain medicine and some medicine to help the stone pass.  If you experience high fevers or uncontrolled pain return to the emergency department.  Schedule appointment with [facility].

## 2022-04-28 NOTE — ED Triage Notes (Signed)
Pt states that she has been having LLQ abd pain all day with n/v/d, denies dysuria or fevers

## 2022-04-28 NOTE — ED Provider Triage Note (Signed)
Emergency Medicine Provider Triage Evaluation Note  [name] , a 67 y.o. female  was evaluated in triage.  Pt complains of left lower quadrant/flank pain since today.  Patient reports episodes of nausea and vomiting.  1 episode of diarrhea.  She reports that she also had some hematuria but no dysuria.  No fevers.  No black or bloody emesis or diarrhea.  No history of kidney stones.  Review of Systems  Positive:  Negative:   Physical Exam  BP (!) 182/93 (BP Location: Left Arm)   Pulse (!) 112   Temp 99.2 F (37.3 C) (Oral)   Resp 18   SpO2 95%  Gen:   Awake, no distress   Resp:  Normal effort  MSK:   Moves extremities without difficulty  Other:  Abdomen soft and nontender to palpation.  Medical Decision Making  Medically screening exam initiated at 9:29 PM.  Appropriate orders placed.  [name] was informed that the remainder of the evaluation will be completed by another provider, this initial triage assessment does not replace that evaluation, and the importance of remaining in the ED until their evaluation is complete.  CT renal ordered as well as labs and urine   Sherrell Puller, PA-C 04/28/22 2129

## 2022-04-28 NOTE — ED Provider Notes (Signed)
[facility] Provider Note   CSN: [id number] Arrival date & time: 04/28/22  2108     History {Add pertinent medical, surgical, social history, OB history to HPI:1} Chief Complaint  Patient presents with   Abdominal Pain    [name] Age is a 67 y.o. female.  She has a history of ulcerative colitis and is on immune suppressants.  She is here with acute onset of stabbing right lower quadrant pain that is been going on since this morning.  No trauma.  It is associated with nausea and vomiting and she is not sure if she might be febrile.  No urinary symptoms.  No diarrhea or constipation.  Has tried nothing for it.  The history is provided by the patient.  Abdominal Pain Pain location:  LLQ Pain quality: stabbing   Pain severity:  Severe Onset quality:  Gradual Duration:  1 day Timing:  Intermittent Progression:  Unchanged Chronicity:  New Context: not trauma   Relieved by:  None tried Worsened by:  Nothing Ineffective treatments:  None tried Associated symptoms: fever, nausea and vomiting   Associated symptoms: no diarrhea, no dysuria, no hematuria and no shortness of breath        Home Medications Prior to Admission medications   Medication Sig Start Date End Date Taking? Authorizing Provider  acetaminophen (TYLENOL) 500 MG tablet Take 500 mg by mouth every 6 (six) hours as needed (For arthritis pain.).     [provider]  Adalimumab (HUMIRA PEN) 40 MG/0.4ML PNKT Inject 40 mg into the skin every 14 (fourteen) days. After starter kit. 11/12/17   Doran Stabler, MD  Adalimumab (HUMIRA PEN-CD/UC/HS STARTER) 40 MG/0.8ML PNKT Inject 40 mg into the skin as directed. Needs starter pack: Inject 160 mg Babbie on day 1, 80 mg Downing on day 15, then maintenance dose. 11/18/17   Doran Stabler, MD  Calcium Carb-Cholecalciferol (CALCIUM-VITAMIN D) 600-400 MG-UNIT TABS Take 1 tablet by mouth daily.    [provider]  cephALEXin  (KEFLEX) 500 MG capsule Take 1 capsule (500 mg total) by mouth 4 (four) times daily. 12/13/21   Prosperi, Christian H, PA-C  cephALEXin (KEFLEX) 500 MG capsule Take 1 capsule (500 mg total) by mouth 4 (four) times daily. 12/13/21   Prosperi, Christian H, PA-C  dicyclomine (BENTYL) 10 MG capsule Take 1 capsule (10 mg total) by mouth every 8 (eight) hours as needed for spasms. 10/08/17   Doran Stabler, MD  hydrocortisone (ANUSOL-HC) 25 MG suppository Place 1 suppository (25 mg total) rectally 2 (two) times daily. 10/18/17   Eugenie Filler, MD  metoprolol succinate (TOPROL-XL) 50 MG 24 hr tablet Take 50 mg by mouth daily. Take with or immediately following a meal.    [provider]  Multiple Vitamin (MULTIVITAMIN) tablet Take 1 tablet by mouth daily.    [provider]  ondansetron (ZOFRAN) 4 MG tablet Take 1 tablet (4 mg total) by mouth every 8 (eight) hours as needed for nausea or vomiting. 10/08/17   Doran Stabler, MD  oxyCODONE-acetaminophen (PERCOCET/ROXICET) 5-325 MG tablet Take 1-2 tablets by mouth every 4 (four) hours as needed for moderate pain or severe pain. 10/18/17   Eugenie Filler, MD  predniSONE (DELTASONE) 20 MG tablet Take 1-2 tablets (20-40 mg total) by mouth daily with breakfast. Take 2 tablets ('40mg'$ ) daily x 14 days, then 1.5 tablets('30mg'$ ) daily x 14 days, then 1 tablets ('20mg'$ ) daily 10/19/17  Eugenie Filler, MD      Allergies    Prednisone    Review of Systems   Review of Systems  Constitutional:  Positive for fever.  Respiratory:  Negative for shortness of breath.   Gastrointestinal:  Positive for abdominal pain, nausea and vomiting. Negative for diarrhea.  Genitourinary:  Negative for dysuria and hematuria.    Physical Exam Updated Vital Signs BP (!) 182/93 (BP Location: Left Arm)   Pulse (!) 112   Temp 99.2 F (37.3 C) (Oral)   Resp 18   SpO2 95%  Physical Exam Vitals and nursing note reviewed.  Constitutional:      General:  She is not in acute distress.    Appearance: She is well-developed.  HENT:     Head: Normocephalic and atraumatic.  Eyes:     Conjunctiva/sclera: Conjunctivae normal.  Cardiovascular:     Rate and Rhythm: Regular rhythm. Tachycardia present.     Heart sounds: No murmur heard. Pulmonary:     Effort: Pulmonary effort is normal. No respiratory distress.     Breath sounds: Normal breath sounds.  Abdominal:     Palpations: Abdomen is soft.     Tenderness: There is no abdominal tenderness. There is no guarding or rebound.  Musculoskeletal:        General: No swelling.     Cervical back: Neck supple.  Skin:    General: Skin is warm and dry.     Capillary Refill: Capillary refill takes less than 2 seconds.  Neurological:     General: No focal deficit present.     Mental Status: She is alert.     ED Results / Procedures / Treatments   Labs (all labs ordered are listed, but only abnormal results are displayed) Labs Reviewed  COMPREHENSIVE METABOLIC PANEL - Abnormal; Notable for the following components:      Result Value   Sodium 134 (*)    Glucose, Bld 106 (*)    Total Protein 8.2 (*)    All other components within normal limits  LIPASE, BLOOD  CBC  URINALYSIS, ROUTINE W REFLEX MICROSCOPIC    EKG None  Radiology CT Renal Stone Study  Result Date: 04/28/2022 CLINICAL DATA:  Left-sided flank pain for 1 day, initial encounter EXAM: CT ABDOMEN AND PELVIS WITHOUT CONTRAST TECHNIQUE: Multidetector CT imaging of the abdomen and pelvis was performed following the standard protocol without IV contrast. RADIATION DOSE REDUCTION: This exam was performed according to the departmental dose-optimization program which includes automated exposure control, adjustment of the mA and/or kV according to patient size and/or use of iterative reconstruction technique. COMPARISON:  10/12/2017 FINDINGS: Lower chest: No acute abnormality. Hepatobiliary: No focal liver abnormality is seen. No gallstones,  gallbladder wall thickening, or biliary dilatation. Pancreas: Unremarkable. No pancreatic ductal dilatation or surrounding inflammatory changes. Spleen: Normal in size without focal abnormality. Adrenals/Urinary Tract: Adrenal glands are within normal limits. Right kidney shows punctate nonobstructing lower pole stone. Right ureter is within normal limits. Left kidney shows mild hydronephrosis and prominent extrarenal pelvis. This is secondary to a 4 mm left UPJ stone. The more distal left ureter is within normal limits the bladder is partially distended. Stomach/Bowel: The appendix is not well visualized. No inflammatory changes to suggest appendicitis are noted. The colon is predominately decompressed. Stomach and small bowel are within normal limits. Vascular/Lymphatic: Aortic atherosclerosis. No enlarged abdominal or pelvic lymph nodes. Reproductive: Uterus and bilateral adnexa are unremarkable. Other: No abdominal wall hernia or abnormality. No abdominopelvic ascites.  Musculoskeletal: Degenerative changes of lumbar spine are seen. IMPRESSION: 4 mm left UPJ stone with mild to moderate hydronephrosis. Punctate nonobstructing lower pole stone on the right. No other focal abnormality is noted. Electronically Signed   By: Inez Catalina M.D.   On: 04/28/2022 21:58    Procedures Procedures  {Document cardiac monitor, telemetry assessment procedure when appropriate:1}  Medications Ordered in ED Medications  sodium chloride 0.9 % bolus 500 mL (has no administration in time range)  ondansetron (ZOFRAN) injection 4 mg (has no administration in time range)  morphine (PF) 4 MG/ML injection 4 mg (has no administration in time range)  ketorolac (TORADOL) 30 MG/ML injection 15 mg (has no administration in time range)    ED Course/ Medical Decision Making/ A&P   {   Click here for ABCD2, HEART and other calculatorsREFRESH Note before signing :1}                          Medical Decision Making Amount and/or  Complexity of Data Reviewed Labs: ordered.  Risk Prescription drug management.   This patient complains of ***; this involves an extensive number of treatment Options and is a complaint that carries with it a high risk of complications and morbidity. The differential includes ***  I ordered, reviewed and interpreted labs, which included *** I ordered medication *** and reviewed PMP when indicated. I ordered imaging studies which included *** and I independently    visualized and interpreted imaging which showed *** Additional history obtained from *** Previous records obtained and reviewed *** I consulted *** and discussed lab and imaging findings and discussed disposition.  Cardiac monitoring reviewed, *** Social determinants considered, *** Critical Interventions: ***  After the interventions stated above, I reevaluated the patient and found *** Admission and further testing considered, ***   {Document critical care time when appropriate:1} {Document review of labs and clinical decision tools ie heart score, Chads2Vasc2 etc:1}  {Document your independent review of radiology images, and any outside records:1} {Document your discussion with family members, caretakers, and with consultants:1} {Document social determinants of health affecting pt's care:1} {Document your decision making why or why not admission, treatments were needed:1} Final Clinical Impression(s) / ED Diagnoses Final diagnoses:  None    Rx / DC Orders ED Discharge Orders     None

## 2022-04-28 NOTE — ED Notes (Signed)
Unsuccessful IV attempt x2.  

## 2022-04-29 LAB — URINALYSIS, ROUTINE W REFLEX MICROSCOPIC
Bilirubin Urine: NEGATIVE
Glucose, UA: NEGATIVE mg/dL
Ketones, ur: 5 mg/dL — AB
Nitrite: NEGATIVE
Protein, ur: 30 mg/dL — AB
RBC / HPF: >50 RBC/hpf (ref 0–5)
Specific Gravity, Urine: 1.014 (ref 1.005–1.030)
pH: 6 (ref 5.0–8.0)

## 2022-04-29 NOTE — ED Provider Notes (Signed)
Care assumed from Dr. Melina Copa.  Patient with left sided flank pain found to have a 4 mm UPJ stone.  Awaiting urinalysis.  On recheck she has no pain.  States she feels well and is tolerating p.o.  Urinalysis shows many white blood cells and leukocyte.  Negative nitrate.  Urine culture pending. D/w  urology. Dr. Kathrynn Ducking.  Does not feel patient needs antibiotics.  Urine culture pending.    Pain and nausea are controlled.  Patient tolerating p.o.  Follow-up with urology.  Return to the ED with worsening pain, fever, vomiting, not able to urinate or any other concerns.   Ezequiel Essex, MD 04/29/22 0157

## 2022-04-29 NOTE — ED Notes (Signed)
Pt verbalized understanding of discharge instructions. Pt dressed for discharge. Pt has access to home. Pt family to pick up. Pt ambulated from ed with steady gait. Pt denied pain at time of discharge.

## 2022-04-30 ENCOUNTER — Encounter (HOSPITAL_COMMUNITY): Payer: Self-pay

## 2022-04-30 ENCOUNTER — Emergency Department (HOSPITAL_COMMUNITY)
Admission: EM | Admit: 2022-04-30 | Discharge: 2022-04-30 | Disposition: A | Discharge status: Home / Self Care | Payer: Medicare Other | Attending: Emergency Medicine | Admitting: Emergency Medicine

## 2022-04-30 ENCOUNTER — Emergency Department (HOSPITAL_COMMUNITY): Payer: Medicare Other

## 2022-04-30 DIAGNOSIS — R109 Unspecified abdominal pain: Secondary | ICD-10-CM | POA: Diagnosis present

## 2022-04-30 DIAGNOSIS — N200 Calculus of kidney: Secondary | ICD-10-CM

## 2022-04-30 DIAGNOSIS — Z96653 Presence of artificial knee joint, bilateral: Secondary | ICD-10-CM | POA: Diagnosis not present

## 2022-04-30 LAB — CBC WITH DIFFERENTIAL/PLATELET
Abs Immature Granulocytes: 0.02 10*3/uL (ref 0.00–0.07)
Basophils Absolute: 0 10*3/uL (ref 0.0–0.1)
Basophils Relative: 0 %
Eosinophils Absolute: 0 10*3/uL (ref 0.0–0.5)
Eosinophils Relative: 0 %
HCT: 36.9 % (ref 36.0–46.0)
Hemoglobin: 11.8 g/dL — ABNORMAL LOW (ref 12.0–15.0)
Immature Granulocytes: 0 %
Lymphocytes Relative: 11 %
Lymphs Abs: 1 10*3/uL (ref 0.7–4.0)
MCH: 30 pg (ref 26.0–34.0)
MCHC: 32 g/dL (ref 30.0–36.0)
MCV: 93.9 fL (ref 80.0–100.0)
Monocytes Absolute: 0.8 10*3/uL (ref 0.1–1.0)
Monocytes Relative: 9 %
Neutro Abs: 7 10*3/uL (ref 1.7–7.7)
Neutrophils Relative %: 80 %
Platelets: 206 10*3/uL (ref 150–400)
RBC: 3.93 MIL/uL (ref 3.87–5.11)
RDW: 14.1 % (ref 11.5–15.5)
WBC: 8.9 10*3/uL (ref 4.0–10.5)
nRBC: 0 % (ref 0.0–0.2)

## 2022-04-30 LAB — COMPREHENSIVE METABOLIC PANEL
ALT: 13 U/L (ref 0–44)
AST: 30 U/L (ref 15–41)
Albumin: 3.8 g/dL (ref 3.5–5.0)
Alkaline Phosphatase: 98 U/L (ref 38–126)
Anion gap: 5 (ref 5–15)
BUN: 17 mg/dL (ref 8–23)
CO2: 23 mmol/L (ref 22–32)
Calcium: 8.9 mg/dL (ref 8.9–10.3)
Chloride: 101 mmol/L (ref 98–111)
Creatinine, Ser: 1.13 mg/dL — ABNORMAL HIGH (ref 0.44–1.00)
GFR, Estimated: 54 mL/min — ABNORMAL LOW (ref >60)
Glucose, Bld: 98 mg/dL (ref 70–99)
Potassium: 4.2 mmol/L (ref 3.5–5.1)
Sodium: 129 mmol/L — ABNORMAL LOW (ref 135–145)
Total Bilirubin: 1.1 mg/dL (ref 0.3–1.2)
Total Protein: 7.8 g/dL (ref 6.5–8.1)

## 2022-04-30 LAB — URINE CULTURE

## 2022-04-30 LAB — URINALYSIS, ROUTINE W REFLEX MICROSCOPIC
Bacteria, UA: NONE SEEN
Bilirubin Urine: NEGATIVE
Glucose, UA: NEGATIVE mg/dL
Ketones, ur: 20 mg/dL — AB
Nitrite: NEGATIVE
Protein, ur: 30 mg/dL — AB
Specific Gravity, Urine: 1.02 (ref 1.005–1.030)
pH: 5 (ref 5.0–8.0)

## 2022-04-30 MED ORDER — SODIUM CHLORIDE 0.9 % IV BOLUS
1000.0000 mL | Freq: Once | INTRAVENOUS | Status: AC
  Administered 2022-04-30: 1000 mL via INTRAVENOUS

## 2022-04-30 MED ORDER — HYDROMORPHONE HCL 1 MG/ML IJ SOLN
1.0000 mg | Freq: Once | INTRAMUSCULAR | Status: AC
  Administered 2022-04-30: 1 mg via INTRAVENOUS
  Filled 2022-04-30: qty 1

## 2022-04-30 MED ORDER — ONDANSETRON 4 MG PO TBDP: 4.0000 mg | ORAL_TABLET | Freq: Three times a day (TID) | ORAL | 1 refills | Status: AC | PRN

## 2022-04-30 MED ORDER — SODIUM CHLORIDE 0.9 % IV SOLN: INTRAVENOUS | Status: DC

## 2022-04-30 MED ORDER — ONDANSETRON HCL 4 MG/2ML IJ SOLN
4.0000 mg | Freq: Once | INTRAMUSCULAR | Status: AC
  Administered 2022-04-30: 4 mg via INTRAVENOUS
  Filled 2022-04-30: qty 2

## 2022-04-30 NOTE — ED Triage Notes (Signed)
Pt presents with c/o left side pain. Pt reports she was diagnosed with a kidney stone yesterday and given prescriptions but reports the pain is so intense at this time.

## 2022-04-30 NOTE — ED Provider Triage Note (Signed)
Emergency Medicine Provider Triage Evaluation Note  [name] , a 67 y.o. female  was evaluated in triage.  Pt complains of left flank pain x today. Recent discharge from the ED this am with 4 mm kidney stone back today because worsening pain. She had two episodes of excruciating pain to the left flank. Took medication x 2 with no improvement in symptoms.   Review of Systems  Positive: Left flank pain Negative: Nausea, vomiting  Physical Exam  BP (!) 187/101 (BP Location: Right Arm)   Pulse (!) 102   Temp 98.1 F (36.7 C) (Oral)   Resp 20   SpO2 98%  Gen:   Awake, no distress   Resp:  Normal effort  MSK:   Moves extremities without difficulty  Other:    Medical Decision Making  Medically screening exam initiated at 2:28 PM.  Appropriate orders placed.  Jaden Burbridge was informed that the remainder of the evaluation will be completed by another provider, this initial triage assessment does not replace that evaluation, and the importance of remaining in the ED until their evaluation is complete.    Janeece Fitting, PA-C 04/30/22 1431

## 2022-04-30 NOTE — ED Provider Notes (Addendum)
[place] EMERGENCY DEPARTMENT AT [facility] Provider Note   CSN: [id number] Arrival date & time: 04/30/22  1412     History  Chief Complaint  Patient presents with   Flank Pain    [name] is a 67 y.o. female.  Seen in the emergency department on March 5.  With left flank pain.  Had CT renal study done that showed a 4 mm ureteral stone. Patient was given pain control they are discharged home on oxycodone.  Patient states having difficulty with pain control still having nausea and vomiting.  Patient not able to take nonsteroidals due to GI problems.  Patient denies any fever or chills.  Past medical history significant for heart attack in 2010 ulcerative colitis hypertension hyperlipidemia.  Past surgical history significant for bilateral total knee replacements tonsillectomy.  Patient is never used tobacco products.       Home Medications Prior to Admission medications   Medication Sig Start Date End Date Taking? Authorizing Provider  acetaminophen (TYLENOL) 500 MG tablet Take 500 mg by mouth every 6 (six) hours as needed (For arthritis pain.).     [provider]  Adalimumab (HUMIRA PEN) 40 MG/0.4ML PNKT Inject 40 mg into the skin every 14 (fourteen) days. After starter kit. 11/12/17   Doran Stabler, MD  Adalimumab (HUMIRA PEN-CD/UC/HS STARTER) 40 MG/0.8ML PNKT Inject 40 mg into the skin as directed. Needs starter pack: Inject 160 mg Bremer on day 1, 80 mg Lorenzo on day 15, then maintenance dose. 11/18/17   Doran Stabler, MD  Calcium Carb-Cholecalciferol (CALCIUM-VITAMIN D) 600-400 MG-UNIT TABS Take 1 tablet by mouth daily.    [provider]  cephALEXin (KEFLEX) 500 MG capsule Take 1 capsule (500 mg total) by mouth 4 (four) times daily. 12/13/21   Prosperi, Christian H, PA-C  cephALEXin (KEFLEX) 500 MG capsule Take 1 capsule (500 mg total) by mouth 4 (four) times daily. 12/13/21   Prosperi, Christian H, PA-C  dicyclomine (BENTYL) 10 MG capsule  Take 1 capsule (10 mg total) by mouth every 8 (eight) hours as needed for spasms. 10/08/17   Doran Stabler, MD  hydrocortisone (ANUSOL-HC) 25 MG suppository Place 1 suppository (25 mg total) rectally 2 (two) times daily. 10/18/17   Eugenie Filler, MD  metoprolol succinate (TOPROL-XL) 50 MG 24 hr tablet Take 50 mg by mouth daily. Take with or immediately following a meal.    [provider]  Multiple Vitamin (MULTIVITAMIN) tablet Take 1 tablet by mouth daily.    [provider]  ondansetron (ZOFRAN) 4 MG tablet Take 1 tablet (4 mg total) by mouth every 8 (eight) hours as needed for nausea or vomiting. 10/08/17   Danis, Kirke Corin, MD  ondansetron (ZOFRAN-ODT) 4 MG disintegrating tablet Take 1 tablet (4 mg total) by mouth every 8 (eight) hours as needed for nausea or vomiting. 04/28/22   Hayden Rasmussen, MD  oxyCODONE-acetaminophen (PERCOCET/ROXICET) 5-325 MG tablet Take 1 tablet by mouth every 6 (six) hours as needed for severe pain. 04/28/22   Hayden Rasmussen, MD  predniSONE (DELTASONE) 20 MG tablet Take 1-2 tablets (20-40 mg total) by mouth daily with breakfast. Take 2 tablets ('40mg'$ ) daily x 14 days, then 1.5 tablets('30mg'$ ) daily x 14 days, then 1 tablets ('20mg'$ ) daily 10/19/17   Eugenie Filler, MD  tamsulosin (FLOMAX) 0.4 MG CAPS capsule Take 1 capsule (0.4 mg total) by mouth daily. 04/28/22   Hayden Rasmussen, MD  Allergies    Prednisone    Review of Systems   Review of Systems  Constitutional:  Negative for chills and fever.  HENT:  Negative for ear pain and sore throat.   Eyes:  Negative for pain and visual disturbance.  Respiratory:  Negative for cough and shortness of breath.   Cardiovascular:  Negative for chest pain and palpitations.  Gastrointestinal:  Positive for nausea and vomiting. Negative for abdominal pain.  Genitourinary:  Positive for flank pain. Negative for dysuria and hematuria.  Musculoskeletal:  Negative for arthralgias and back pain.   Skin:  Negative for color change and rash.  Neurological:  Negative for seizures and syncope.  All other systems reviewed and are negative.   Physical Exam Updated Vital Signs BP (!) 164/91 (BP Location: Right Arm)   Pulse (!) 102   Temp 98.3 F (36.8 C) (Oral)   Resp 20   SpO2 96%  Physical Exam Vitals and nursing note reviewed.  Constitutional:      General: She is not in acute distress.    Appearance: Normal appearance. She is well-developed.  HENT:     Head: Normocephalic and atraumatic.  Eyes:     Extraocular Movements: Extraocular movements intact.     Conjunctiva/sclera: Conjunctivae normal.     Pupils: Pupils are equal, round, and reactive to light.  Cardiovascular:     Rate and Rhythm: Normal rate and regular rhythm.     Heart sounds: No murmur heard. Pulmonary:     Effort: Pulmonary effort is normal. No respiratory distress.     Breath sounds: Normal breath sounds.  Abdominal:     Palpations: Abdomen is soft.     Tenderness: There is no abdominal tenderness.  Musculoskeletal:        General: No swelling.     Cervical back: Normal range of motion and neck supple.  Skin:    General: Skin is warm and dry.     Capillary Refill: Capillary refill takes less than 2 seconds.  Neurological:     General: No focal deficit present.     Mental Status: She is alert and oriented to person, place, and time.     Cranial Nerves: No cranial nerve deficit.     Sensory: No sensory deficit.  Psychiatric:        Mood and Affect: Mood normal.     ED Results / Procedures / Treatments   Labs (all labs ordered are listed, but only abnormal results are displayed) Labs Reviewed  CBC WITH DIFFERENTIAL/PLATELET - Abnormal; Notable for the following components:      Result Value   Hemoglobin 11.8 (*)    All other components within normal limits  URINALYSIS, ROUTINE W REFLEX MICROSCOPIC - Abnormal; Notable for the following components:   APPearance HAZY (*)    Hgb urine dipstick  MODERATE (*)    Ketones, ur 20 (*)    Protein, ur 30 (*)    Leukocytes,Ua MODERATE (*)    Non Squamous Epithelial 0-5 (*)    All other components within normal limits  COMPREHENSIVE METABOLIC PANEL    EKG None  Radiology CT Renal Stone Study  Result Date: 04/28/2022 CLINICAL DATA:  Left-sided flank pain for 1 day, initial encounter EXAM: CT ABDOMEN AND PELVIS WITHOUT CONTRAST TECHNIQUE: Multidetector CT imaging of the abdomen and pelvis was performed following the standard protocol without IV contrast. RADIATION DOSE REDUCTION: This exam was performed according to the departmental dose-optimization program which includes automated exposure control, adjustment of  the mA and/or kV according to patient size and/or use of iterative reconstruction technique. COMPARISON:  10/12/2017 FINDINGS: Lower chest: No acute abnormality. Hepatobiliary: No focal liver abnormality is seen. No gallstones, gallbladder wall thickening, or biliary dilatation. Pancreas: Unremarkable. No pancreatic ductal dilatation or surrounding inflammatory changes. Spleen: Normal in size without focal abnormality. Adrenals/Urinary Tract: Adrenal glands are within normal limits. Right kidney shows punctate nonobstructing lower pole stone. Right ureter is within normal limits. Left kidney shows mild hydronephrosis and prominent extrarenal pelvis. This is secondary to a 4 mm left UPJ stone. The more distal left ureter is within normal limits the bladder is partially distended. Stomach/Bowel: The appendix is not well visualized. No inflammatory changes to suggest appendicitis are noted. The colon is predominately decompressed. Stomach and small bowel are within normal limits. Vascular/Lymphatic: Aortic atherosclerosis. No enlarged abdominal or pelvic lymph nodes. Reproductive: Uterus and bilateral adnexa are unremarkable. Other: No abdominal wall hernia or abnormality. No abdominopelvic ascites. Musculoskeletal: Degenerative changes of lumbar  spine are seen. IMPRESSION: 4 mm left UPJ stone with mild to moderate hydronephrosis. Punctate nonobstructing lower pole stone on the right. No other focal abnormality is noted. Electronically Signed   By: Inez Catalina M.D.   On: 04/28/2022 21:58    Procedures Procedures    Medications Ordered in ED Medications  0.9 %  sodium chloride infusion (has no administration in time range)  sodium chloride 0.9 % bolus 1,000 mL (has no administration in time range)  ondansetron (ZOFRAN) injection 4 mg (has no administration in time range)  HYDROmorphone (DILAUDID) injection 1 mg (has no administration in time range)    ED Course/ Medical Decision Making/ A&P                             Medical Decision Making Amount and/or Complexity of Data Reviewed Radiology: ordered.  Risk Prescription drug management.   Will give patient IV fluids.  Check labs CBC complete metabolic panel urinalysis.  Will redo CT renal study scan.  CBC no leukocytosis hemoglobin 11.8.  Complete metabolic panel pending.  Complete metabolic panel significant for creatinine now up to 1.13 for a GFR of 54.  Not significant change but a little bit worse than it was a few days ago.   CT renal study shows the stone to be essentially unchanged.  Urinalysis moderate leukocytes.  RBCs 21-50 white blood cells 11-20 but no signs of any bacteria in the urine.  Most likely not consistent with infection.  Patient feeling better.  Will give another dose of IV pain medicine here.  She has oxycodone to take at home.  Has yet to make an appointment with [facility].  Reinforced that it is important to do that.  Particularly if she is unable to pass the stone.  Also patient just has Zofran tablets at home we will give her dissolvable Zofran which may help her keep the pain medicine down.  Final Clinical Impression(s) / ED Diagnoses Final diagnoses:  Left flank pain  Kidney stone    Rx / DC Orders ED Discharge Orders      None         Fredia Sorrow, MD 04/30/22 1631    Fredia Sorrow, MD 04/30/22 Hazle Nordmann    Fredia Sorrow, MD 04/30/22 [phone]

## 2022-04-30 NOTE — Discharge Instructions (Addendum)
Make an appointment follow-up with [facility].  Take your oxycodone pain medicine as directed.  You can use the dissolvable Zofran tablets instead.  Today's workup shows that the kidney stone is still present has not moved much.  So further evaluation by urology will be important.  No signs of any urinary tract infection at this point in time.  Continue to hydrate yourself well.

## 2022-05-15 ENCOUNTER — Ambulatory Visit
Admission: RE | Admit: 2022-05-15 | Discharge: 2022-05-15 | Disposition: A | Discharge status: Home / Self Care | Payer: Medicare Other | Source: Ambulatory Visit | Attending: Geriatric Medicine | Admitting: Geriatric Medicine

## 2022-05-15 DIAGNOSIS — Z1231 Encounter for screening mammogram for malignant neoplasm of breast: Secondary | ICD-10-CM

## 2022-06-16 ENCOUNTER — Ambulatory Visit: Payer: Medicare Other | Admitting: Podiatry

## 2022-06-17 ENCOUNTER — Ambulatory Visit (HOSPITAL_BASED_OUTPATIENT_CLINIC_OR_DEPARTMENT_OTHER): Payer: Medicare Other | Admitting: General Surgery

## 2022-06-18 ENCOUNTER — Telehealth: Payer: Self-pay | Admitting: *Deleted

## 2022-06-18 ENCOUNTER — Encounter (HOSPITAL_BASED_OUTPATIENT_CLINIC_OR_DEPARTMENT_OTHER): Payer: Medicare Other | Attending: General Surgery | Admitting: General Surgery

## 2022-06-18 DIAGNOSIS — Z79620 Long term (current) use of immunosuppressive biologic: Secondary | ICD-10-CM | POA: Insufficient documentation

## 2022-06-18 DIAGNOSIS — I872 Venous insufficiency (chronic) (peripheral): Secondary | ICD-10-CM | POA: Diagnosis not present

## 2022-06-18 DIAGNOSIS — L97322 Non-pressure chronic ulcer of left ankle with fat layer exposed: Secondary | ICD-10-CM | POA: Insufficient documentation

## 2022-06-18 DIAGNOSIS — K51919 Ulcerative colitis, unspecified with unspecified complications: Secondary | ICD-10-CM | POA: Diagnosis not present

## 2022-06-18 NOTE — Telephone Encounter (Signed)
Pt called leaving the wound care appointment and is now in a unna boot and wants to know if she can still be seen on 4/30 with it on that covers her leg and foot.  Please advise

## 2022-06-18 NOTE — Telephone Encounter (Signed)
Patient is calling because she has been seen at wound center and they have put an unna boot on until her next appointment 05/07, should she reschedule her appointment 06/22/22 with Dr Al Corpus?

## 2022-06-18 NOTE — Progress Notes (Addendum)
[name] ([id number]) [id number] Page 1 of 9 Visit Report for 06/18/2022 Chief Complaint Document Details Patient Name: Date of Service: Tara, [name] 06/18/2022 8:00 A M Medical Record Number: [id number] Patient Account Number: [id number] Date of Birth/Sex: Treating RN: [birth year] (67 y.o. F) Primary Care Provider: SYSTEM, PRO V IDER Other Clinician: Referring Provider: Treating Provider/Extender: Madaline [name] in Treatment: 0 Information Obtained from: Patient Chief Complaint Patient presents for treatment of an open ulcer due to venous insufficiency Electronic Signature(s) Signed: 06/18/2022 9:25:27 AM By: Tara Guess MD FACS Entered By: [name] on 06/18/2022 09:25:27 -------------------------------------------------------------------------------- Debridement Details Patient Name: Date of Service: [name] 06/18/2022 8:00 A M Medical Record Number: [id number] Patient Account Number: [id number] Date of Birth/Sex: Treating RN: 14-Sep-1955 (67 y.o. [name] Primary Care Provider: SYSTEM, PRO V IDER Other Clinician: Referring Provider: Treating Provider/Extender: Madaline [name] in Treatment: 0 Debridement Performed for Assessment: Wound #1 Left,Medial Ankle Performed By: Physician Tara Guess, MD Debridement Type: Debridement Severity of Tissue Pre Debridement: Fat layer exposed Level of Consciousness (Pre-procedure): Awake and Alert Pre-procedure Verification/Time Out Yes - 09:10 Taken: Start Time: 09:11 Pain Control: Lidocaine 4% Topical Solution Percent of Wound Bed Debrided: 100% T Area Debrided (cm): otal 5.07 Tissue and other material debrided: Slough, Slough Level: Non-Viable Tissue Debridement Description: Selective/Open Wound Instrument: Curette Bleeding: Minimum Hemostasis Achieved: Pressure End Time: 09:12 Procedural Pain: 5 Post Procedural Pain:  5 Response to Treatment: Procedure was tolerated well Level of Consciousness (Post- Awake and Alert procedure): Post Debridement Measurements of Total Wound Length: (cm) 3.8 Width: (cm) 1.7 Depth: (cm) 0.2 Volume: (cm) 1.015 Character of Wound/Ulcer Post Debridement: Stable Severity of Tissue Post Debridement: Fat layer exposed Post Procedure Diagnosis Same as Pre-procedure Notes Scribed for Dr. Lady Gary by Tara Grills RN. [name], [name] ([id number]) [id number] Page 2 of 9 Electronic Signature(s) Signed: 06/18/2022 4:24:20 PM By: Tara Guess MD FACS Signed: 06/22/2022 4:01:24 PM By: [name] Entered By: [name] on 06/18/2022 09:16:41 -------------------------------------------------------------------------------- HPI Details Patient Name: Date of Service: [name], [name] 06/18/2022 8:00 A M Medical Record Number: [id number] Patient Account Number: [id number] Date of Birth/Sex: Treating RN: August 20, 1955 (67 y.o. F) Primary Care Provider: SYSTEM, PRO V IDER Other Clinician: Referring Provider: Treating Provider/Extender: Madaline [name] in Treatment: 0 History of Present Illness HPI Description: ADMISSION This is a 67 year old woman with a history of ulcerative colitis, not currently on any immunosuppressive agents, who first noticed some left ankle swelling and a small wound in October 2023. She was seen in the emergency department and given a course of oral antibiotics. The wound did not really improve. Her primary care doctor attempted to apply an Unna boot, but it sounds as though it was not quite properly applied and it was very painful and she developed significant swelling above the boot. Since that time, she has simply been applying Neosporin to the site. The wound has gotten larger and as result, she was referred to the wound care center for further evaluation and management. She has worn compression stockings in  the past, but was not wearing them when she developed the wound. ABI today was 1.02. She is not diabetic, nor does she smoke. 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 On her left medial ankle, there is an irregular wound consistent with a venous stasis ulcer. There is slough accumulation on the surface. The moisture balance  is tending towards dry. She does have hemosiderin deposition on the bilateral ankles along with 1+ nonpitting edema. Electronic Signature(s) Signed: 06/18/2022 9:28:53 AM By: Tara Guess MD FACS Entered By: [name] on 06/18/2022 09:28:52 -------------------------------------------------------------------------------- Physical Exam Details Patient Name: Date of Service: Tara, [name] 06/18/2022 8:00 A M Medical Record Number: [id number] Patient Account Number: [id number] Date of Birth/Sex: Treating RN: 1955/11/19 (67 y.o. F) Primary Care Provider: SYSTEM, PRO V IDER Other Clinician: Referring Provider: Treating Provider/Extender: Madaline [name] in Treatment: 0 Constitutional Slightly hypertensive. . . . No acute distress. Respiratory Normal work of breathing on room air. Cardiovascular 1+ nonpitting edema bilaterally. Notes 06/18/2022: On her left medial ankle, there is an irregular wound consistent with a venous stasis ulcer. There is slough accumulation on the surface. The moisture balance is tending towards dry. She does have hemosiderin deposition on the bilateral ankles along with 1+ nonpitting edema. Electronic Signature(s) Signed: 06/18/2022 9:29:49 AM By: Tara Guess MD FACS Entered By: [name] on 06/18/2022 09:29:49 -------------------------------------------------------------------------------- Physician Orders Details Patient Name: Date of Service: [name] 06/18/2022 8:00 A M Medical Record Number: [id number] Patient Account Number:  [id number] Date of Birth/Sex: Treating RN: [birth year] (67 y.o. 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 - Dr, Lady Gary Rm 3 Return A Bathing/ Shower/ Hygiene May shower with protection but do not get wound dressing(s) wet. Protect dressing(s) with water repellant cover (for example, large plastic bag) or a cast cover and may then take shower. Edema Control - Lymphedema / SCD / Other Left Lower Extremity Elevate legs to the level of the heart or above for 30 minutes daily and/or when sitting for 3-4 times a day throughout the day. A void standing for long periods of time. Patient to wear own compression stockings every day. Exercise regularly If compression wraps slide down please call wound center and speak with a nurse. Additional Orders / Instructions Wound #1 Left,Medial Ankle Follow Nutritious Diet Wound Treatment Wound #1 - Ankle Wound Laterality: Left, Medial Cleanser: Soap and Water Discharge Instructions: May shower and wash wound with dial antibacterial soap and water prior to dressing change. Peri-Wound Care: Sween Lotion (Moisturizing lotion) Discharge Instructions: Apply moisturizing lotion as directed Prim Dressing: IODOFLEX 0.9% Cadexomer Iodine Pad 4x6 cm ary Discharge Instructions: Apply to wound bed as instructed Secured With: Paper Tape, 2x10 (in/yd) Discharge Instructions: Secure dressing with tape as directed. Compression Wrap: Urgo K2  Lite, two layer compression system, regular Discharge Instructions: Apply Urgo K2 Lite as directed (alternative to 3 layer compression). Services and Therapies Venous Studies -Bilateral Electronic Signature(s) Signed: 06/18/2022 4:24:20 PM By: Tara Guess MD FACS Signed: 06/22/2022 4:01:24 PM By: [name] Entered By: [name] on 06/18/2022 09:43:33 Prescription 06/18/2022 -------------------------------------------------------------------------------- Tara Friar MD Patient Name: Provider: [birth year] [id number] Date of Birth: NPI#Carmon Ginsberg [id number] Sex: DEA #: [phone] Phone #: License #: UPN: Patient Address: [address] Eligha Bridegroom [facility] [place], [place] [address] [address] [place], [place] [address] [phone] Carney Living ([id number]) [id number] Page 4 of 9 Allergies No  Known Allergies Provider's Orders Venous Studies -Bilateral Hand Signature: Date(s): Electronic Signature(s) Signed: 06/18/2022 4:24:20 PM By: Tara Guess MD FACS Signed: 06/22/2022 4:01:24 PM By: [name] Entered By: [name] on 06/18/2022 09:43:33 -------------------------------------------------------------------------------- Problem List Details Patient Name: Date of Service: [name] 06/18/2022 8:00 A M Medical Record Number: [id number] Patient Account Number: [id number] Date of Birth/Sex: Treating RN: August 29, 1955 (66 y.o. F) Primary Care Provider: SYSTEM, PRO V IDER Other Clinician: Referring Provider: Treating Provider/Extender: Madaline [name] in Treatment: 0 Active Problems ICD-10 Encounter Code Description Active Date MDM Diagnosis L97.322 Non-pressure chronic ulcer of left ankle with fat layer exposed 06/18/2022 No Yes I87.2 Venous insufficiency (chronic) (peripheral) 06/18/2022 No Yes K51.90 Ulcerative colitis, unspecified,  without complications 06/18/2022 No Yes Inactive Problems Resolved Problems Electronic Signature(s) Signed: 06/18/2022 9:23:53 AM By: Tara Guess MD FACS Previous Signature: 06/18/2022 8:25:25 AM Version By: Tara Guess MD FACS Entered By: [name] on 06/18/2022 09:23:53 -------------------------------------------------------------------------------- Progress Note Details Patient Name: Date of Service: [name] 06/18/2022 8:00 A M Medical Record Number: [id number] Patient Account Number: [id number] Date of Birth/Sex: Treating RN: 1955/10/21 (67 y.o. F) Primary Care Provider: SYSTEM, PRO V IDER Other Clinician: Referring Provider: Treating Provider/Extender: Madaline [name] in Treatment: 0 Subjective Chief Complaint Information obtained from Patient Patient presents for treatment of an open ulcer due to venous insufficiency [name], [name] ([id number]) [id number] Page 5 of 9 History of Present Illness (HPI) ADMISSION This is a 67 year old woman with a history of ulcerative colitis, not currently on any immunosuppressive agents, who first noticed some left ankle swelling and a small wound in October 2023. She was seen in the emergency department and given a course of oral antibiotics. The wound did not really improve. Her primary care doctor attempted to apply an Unna boot, but it sounds as though it was not quite properly applied and it was very painful and she developed significant swelling above the boot. Since that time, she has simply been applying Neosporin to the site. The wound has gotten larger and as result, she was referred to the wound care center for further evaluation and management. She has worn compression stockings in the past, but was not wearing them when she developed the wound. ABI today was 1.02. She is not diabetic, nor does she smoke. 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 On her left medial ankle, there is an irregular wound consistent with a venous stasis ulcer. There is slough accumulation on the surface. The moisture balance is tending towards dry. She does have hemosiderin deposition on the bilateral ankles along with 1+ nonpitting edema. Patient History Information obtained from Patient. Allergies No Known Allergies Family History Diabetes - Siblings, Heart Disease - Father, Hypertension - Mother, Stroke - Mother, No family history of Cancer, Hereditary Spherocytosis, Kidney Disease, Lung Disease, Seizures, Thyroid Problems, Tuberculosis. Social History Never smoker, Marital Status - Widowed, Alcohol Use - Rarely, Drug Use - No History, Caffeine Use - Daily. Medical History Gastrointestinal Patient has history of Colitis Musculoskeletal Patient has history of Rheumatoid Arthritis Review of Systems (ROS) Constitutional Symptoms (General Health) Denies complaints or symptoms of Fatigue, Fever, Chills, Marked Weight Change. Eyes Complains or has symptoms of Glasses / Contacts. Denies complaints or symptoms of Dry Eyes, Vision Changes. Ear/Nose/Mouth/Throat Denies complaints or symptoms of Chronic sinus problems or rhinitis. Respiratory Denies complaints or symptoms of Chronic or frequent coughs, Shortness  of Breath. Cardiovascular Denies complaints or symptoms of Chest pain. Endocrine Denies complaints or symptoms of Heat/cold intolerance. Genitourinary Denies complaints or symptoms of Frequent urination. Integumentary (Skin) Complains or has symptoms of Wounds. Musculoskeletal Denies complaints or symptoms of Muscle Pain, Muscle Weakness. Neurologic Denies complaints or symptoms of Numbness/parasthesias. Psychiatric Denies complaints or symptoms of Claustrophobia. Objective Constitutional Slightly hypertensive. No acute distress. Vitals Time Taken: 8:15 AM, Height: 67 in, Weight: 225 lbs, BMI: 35.2,  Temperature: 97.7 F, Pulse: 87 bpm, Respiratory Rate: 18 breaths/min, Blood Pressure: 141/94 mmHg. Respiratory Normal work of breathing on room air. Cardiovascular 1+ nonpitting edema bilaterally. General Notes: 06/18/2022: On her left medial ankle, there is an irregular wound consistent with a venous stasis ulcer. There is slough accumulation on the surface. The moisture balance is tending towards dry. She does have hemosiderin deposition on the bilateral ankles along with 1+ nonpitting edema. Integumentary (Hair, Skin) Wound #1 status is Open. Original cause of wound was Gradually Appeared. The date acquired was: 11/23/2021. The wound is located on the Left,Medial Ankle. The wound measures 3.8cm length x 1.7cm width x 0.2cm depth; 5.074cm^2 area and 1.015cm^3 volume. There is Fat Layer (Subcutaneous Tissue) exposed. There is no tunneling or undermining noted. There is a medium amount of serosanguineous drainage noted. The wound margin is distinct with the outline attached to the wound base. There is medium (34-66%) red, pink granulation within the wound bed. There is a medium (34-66%) amount of necrotic tissue within [name], [name] ([id number]) [id number] Page 6 of 9 the wound bed including Adherent Slough. The periwound skin appearance exhibited: Scarring, Hemosiderin Staining. The periwound skin appearance did not exhibit: Dry/Scaly, Maceration. Periwound temperature was noted as No Abnormality. Assessment Active Problems ICD-10 Non-pressure chronic ulcer of left ankle with fat layer exposed Venous insufficiency (chronic) (peripheral) Ulcerative colitis, unspecified, without complications Procedures Wound #1 Pre-procedure diagnosis of Wound #1 is a Venous Leg Ulcer located on the Left,Medial Ankle .Severity of Tissue Pre Debridement is: Fat layer exposed. There was a Selective/Open Wound Non-Viable Tissue Debridement with a total area of 5.07 sq cm performed by  Tara Guess, MD. With the following instrument(s): Curette Material removed includes [facility] after achieving pain control using Lidocaine 4% Topical Solution. No specimens were taken. A time out was conducted at 09:10, prior to the start of the procedure. A Minimum amount of bleeding was controlled with Pressure. The procedure was tolerated well with a pain level of 5 throughout and a pain level of 5 following the procedure. Post Debridement Measurements: 3.8cm length x 1.7cm width x 0.2cm depth; 1.015cm^3 volume. Character of Wound/Ulcer Post Debridement is stable. Severity of Tissue Post Debridement is: Fat layer exposed. Post procedure Diagnosis Wound #1: Same as Pre-Procedure General Notes: Scribed for Dr. Lady Gary by Tara Grills RN.. Pre-procedure diagnosis of Wound #1 is a Venous Leg Ulcer located on the Left,Medial Ankle . There was a Four Layer Compression Therapy Procedure by Tara Grills, RN. Post procedure Diagnosis Wound #1: Same as Pre-Procedure Plan Follow-up Appointments: Return Appointment in 1 week. - Dr, Lady Gary Rm 3 Bathing/ Shower/ Hygiene: May shower with protection but do not get wound dressing(s) wet. Protect dressing(s) with water repellant cover (for example, large plastic bag) or a cast cover and may then take shower. Edema Control - Lymphedema / SCD / Other: Elevate legs to the level of the heart or above for 30 minutes daily and/or when sitting for 3-4 times a day throughout the day. Avoid standing for long periods of time.  Patient to wear own compression stockings every day. Exercise regularly If compression wraps slide down please call wound center and speak with a nurse. Additional Orders / Instructions: Wound #1 Left,Medial Ankle: Follow Nutritious Diet Services and Therapies ordered were: Venous Studies -Bilateral WOUND #1: - Ankle Wound Laterality: Left, Medial Cleanser: Soap and Water Discharge Instructions: May shower and wash wound with dial  antibacterial soap and water prior to dressing change. Peri-Wound Care: Sween Lotion (Moisturizing lotion) Discharge Instructions: Apply moisturizing lotion as directed Prim Dressing: IODOFLEX 0.9% Cadexomer Iodine Pad 4x6 cm ary Discharge Instructions: Apply to wound bed as instructed Secured With: Paper T ape, 2x10 (in/yd) Discharge Instructions: Secure dressing with tape as directed. Com pression Wrap: Urgo K2 Lite, two layer compression system, regular Discharge Instructions: Apply Urgo K2 Lite as directed (alternative to 3 layer compression). 06/18/2022: This is a 67 year old relatively healthy woman who presents with a nonhealing wound on her left medial ankle. It appears to be venous in origin. On her left medial ankle, there is an irregular wound consistent with a venous stasis ulcer. There is slough accumulation on the surface. The moisture balance is tending towards dry. She does have hemosiderin deposition on the bilateral ankles along with 1+ nonpitting edema. I used a curette to debride the slough from her wound. This wound will benefit from additional chemical debridement this week. We will apply Iodoflex. We will apply 3 layer compression equivalent. I am going to send her for venous reflux studies as she may benefit from saphenous vein ablation. She will follow-up in 1 week. Electronic Signature(s) Almyra, [name] ([id number]) [id number] Page 7 of 9 Signed: 06/22/2022 5:00:20 PM By: Shawn Stall RN, BSN Signed: 06/23/2022 8:01:38 AM By: Tara Guess MD FACS Previous Signature: 06/18/2022 9:32:01 AM Version By: Tara Guess MD FACS Entered By: Shawn Stall on 06/22/2022 16:49:59 -------------------------------------------------------------------------------- HxROS Details Patient Name: Date of Service: [name] 06/18/2022 8:00 A M Medical Record Number: [id number] Patient Account Number: [id number] Date of Birth/Sex: Treating RN: [birth year]  (67 y.o. [name] Primary Care Provider: SYSTEM, PRO Rodena Goldmann Other Clinician: Referring Provider: Treating Provider/Extender: Madaline [name] in Treatment: 0 Information Obtained From Patient Constitutional Symptoms (General Health) Complaints and Symptoms: Negative for: Fatigue; Fever; Chills; Marked Weight Change Eyes Complaints and Symptoms: Positive for: Glasses / Contacts Negative for: Dry Eyes; Vision Changes Ear/Nose/Mouth/Throat Complaints and Symptoms: Negative for: Chronic sinus problems or rhinitis Respiratory Complaints and Symptoms: Negative for: Chronic or frequent coughs; Shortness of Breath Cardiovascular Complaints and Symptoms: Negative for: Chest pain Endocrine Complaints and Symptoms: Negative for: Heat/cold intolerance Genitourinary Complaints and Symptoms: Negative for: Frequent urination Integumentary (Skin) Complaints and Symptoms: Positive for: Wounds Musculoskeletal Complaints and Symptoms: Negative for: Muscle Pain; Muscle Weakness Medical History: Positive for: Rheumatoid Arthritis Neurologic Complaints and Symptoms: Negative for: Numbness/parasthesias Psychiatric Complaints and Symptoms: Negative for: RILYN, SCROGGS ([id number]) [id number] Page 8 of 9 Hematologic/Lymphatic Gastrointestinal Medical History: Positive for: Colitis Immunological Oncologic Immunizations Pneumococcal Vaccine: Received Pneumococcal Vaccination: Yes Received Pneumococcal Vaccination On or After 60th Birthday: No Tetanus Vaccine: Last tetanus shot: 05/25/2015 Implantable Devices None Family and Social History Cancer: No; Diabetes: Yes - Siblings; Heart Disease: Yes - Father; Hereditary Spherocytosis: No; Hypertension: Yes - Mother; Kidney Disease: No; Lung Disease: No; Seizures: No; Stroke: Yes - Mother; Thyroid Problems: No; Tuberculosis: No; Never smoker; Marital Status - Widowed;  Alcohol Use: Rarely; Drug Use: No History; Caffeine Use: Daily; Financial Concerns: No; Food, Clothing or Shelter Needs: No; Support System  Lacking: No; Transportation Concerns: No Electronic Signature(s) Signed: 06/18/2022 4:24:20 PM By: Tara Guess MD FACS Signed: 06/22/2022 4:01:24 PM By: [name] Entered By: [name] on 06/18/2022 08:35:50 -------------------------------------------------------------------------------- SuperBill Details Patient Name: Date of Service: [name] 06/18/2022 Medical Record Number: [id number] Patient Account Number: [id number] Date of Birth/Sex: Treating RN: November 18, 1955 (67 y.o. F) Primary Care Provider: SYSTEM, PRO V IDER Other Clinician: Referring Provider: Treating Provider/Extender: Madaline [name] in Treatment: 0 Diagnosis Coding ICD-10 Codes Code Description [phone] Non-pressure chronic ulcer of left ankle with fat layer exposed I87.2 Venous insufficiency (chronic) (peripheral) K51.90 Ulcerative colitis, unspecified, without complications Facility Procedures : CPT4 Code: [id number] Description: 97597 - DEBRIDE WOUND 1ST 20 SQ CM OR < ICD-10 Diagnosis Description L97.322 Non-pressure chronic ulcer of left ankle with fat layer exposed Modifier: Quantity: 1 Physician Procedures : CPT4 Code Description Modifier [id number] - WC PHYS LEVEL 4 - NEW PT 25 ICD-10 Diagnosis Description L97.322 Non-pressure chronic ulcer of left ankle with fat layer exposed I87.2 Venous insufficiency (chronic) (peripheral) K51.90 Ulcerative colitis,  unspecified, without complications Quantity: 1 : [id number] 97597 - WC PHYS DEBR WO ANESTH 20 SQ CM ICD-10 Diagnosis Description SHALIMAR, MCCLAIN ([id number]) [id number] L97.322 Non-pressure chronic ulcer of left ankle with fat layer exposed Quantity: 1 27.pdf Page 9 of 9 Electronic Signature(s) Signed: 06/18/2022 9:32:23 AM By: Tara Guess MD FACS Entered By:  [name] on 06/18/2022 09:32:22

## 2022-06-23 ENCOUNTER — Ambulatory Visit: Payer: Medicare Other | Admitting: Podiatry

## 2022-06-23 NOTE — Progress Notes (Signed)
Tara, [name] ([id number]) [id number] Page 1 of 4 Visit Report for 06/18/2022 Abuse Risk Screen Details Patient Name: Date of Service: [name], [name] 06/18/2022 8:00 A M Medical Record Number: [id number] Patient Account Number: [id number] Date of Birth/Sex: Treating RN: 10-09-1955 (67 y.o. Gevena Mart Primary Care Marcelyn Ruppe: SYSTEM, PRO Rodena Goldmann Other Clinician: Referring Stevey Stapleton: Treating Adeli Frost/Extender: Madaline Guthrie in Treatment: 0 Abuse Risk Screen Items Answer ABUSE RISK SCREEN: Has anyone close to you tried to hurt or harm you recentlyo No Do you feel uncomfortable with anyone in your familyo No Has anyone forced you do things that you didnt want to doo No Electronic Signature(s) Signed: 06/22/2022 4:01:24 PM By: Brenton Grills Entered By: Brenton Grills on 06/18/2022 08:29:10 -------------------------------------------------------------------------------- Activities of Daily Living Details Patient Name: Date of Service: Tara, [name] 06/18/2022 8:00 A M Medical Record Number: [id number] Patient Account Number: [id number] Date of Birth/Sex: Treating RN: 1955/04/04 (67 y.o. Gevena Mart Primary Care Ilian Wessell: SYSTEM, PRO Rodena Goldmann Other Clinician: Referring Menachem Urbanek: Treating Lexis Potenza/Extender: Madaline Guthrie in Treatment: 0 Activities of Daily Living Items Answer Activities of Daily Living (Please select one for each item) Drive Automobile Completely Able T Medications ake Completely Able Use T elephone Completely Able Care for Appearance Completely Able Use T oilet Completely Able Bath / Shower Completely Able Dress Self Completely Able Feed Self Completely Able Walk Completely Able Get In / Out Bed Completely Able Housework Completely Able Prepare Meals Completely Able Handle Money Completely Able Shop for Self Completely Able Electronic Signature(s) Signed: 06/22/2022 4:01:24  PM By: Brenton Grills Entered By: Brenton Grills on 06/18/2022 08:29:49 -------------------------------------------------------------------------------- Education Screening Details Patient Name: Date of Service: [name] 06/18/2022 8:00 A M Medical Record Number: [id number] Patient Account Number: [id number] Date of Birth/Sex: Treating RN: [birth year] (67 y.o. Gevena Mart Primary Care Divonte Senger: SYSTEM, PRO Rodena Goldmann Other Clinician: Referring Jaxston Chohan: Treating Marteze Vecchio/Extender: Madaline Guthrie in Treatment: 0 [name], [name] ([id number]) [id number] [id number] Page 2 of 4 Learning Preferences/Education Level/Primary Language Highest Education Level: College or Above Preferred Language: Economist Language Barrier: No Translator Needed: No Memory Deficit: No Emotional Barrier: No Cultural/Religious Beliefs Affecting Medical Care: No Physical Barrier Impaired Vision: Yes Glasses Impaired Hearing: No Decreased Hand dexterity: No Knowledge/Comprehension Knowledge Level: High Comprehension Level: High Ability to understand written instructions: High Ability to understand verbal instructions: High Motivation Anxiety Level: Calm Cooperation: Cooperative Education Importance: Acknowledges Need Interest in Health Problems: Asks Questions Perception: Coherent Willingness to Engage in Self-Management High Activities: Readiness to Engage in Self-Management High Activities: Electronic Signature(s) Signed: 06/22/2022 4:01:24 PM By: Brenton Grills Entered By: Brenton Grills on 06/18/2022 08:30:45 -------------------------------------------------------------------------------- Fall Risk Assessment Details Patient Name: Date of Service: [name] 06/18/2022 8:00 A M Medical Record Number: [id number] Patient Account Number: [id number] Date of Birth/Sex: Treating RN: 12-Feb-1956 (67 y.o. Gevena Mart Primary Care  Nysia Dell: SYSTEM, PRO V IDER Other Clinician: Referring Maddax Palinkas: Treating Deniah Saia/Extender: Madaline Guthrie in Treatment: 0 Fall Risk Assessment Items Have you had 2 or more falls in the last 12 monthso 0 No Have you had any fall that resulted in injury in the last 12 monthso 0 No FALLS RISK SCREEN History of falling - immediate or within 3 months 0 No Secondary diagnosis (Do you have 2 or more medical diagnoseso) 0 No Ambulatory aid None/bed rest/wheelchair/nurse 0 No Crutches/cane/walker 0 No Furniture 0 No Intravenous therapy Access/Saline/Heparin Lock 0 No Gait/Transferring Normal/ bed  rest/ wheelchair 0 No Weak (short steps with or without shuffle, stooped but able to lift head while walking, may seek 0 No support from furniture) Impaired (short steps with shuffle, may have difficulty arising from chair, head down, impaired 0 No balance) Mental Status Oriented to own ability 0 No Electronic Signature(s) WILLIS, KUIPERS ([id number]) [id number] [id number] Page 3 of 4 Signed: 06/22/2022 4:01:24 PM By: Brenton Grills Entered By: Brenton Grills on 06/18/2022 08:31:01 -------------------------------------------------------------------------------- Foot Assessment Details Patient Name: Date of Service: [name] 06/18/2022 8:00 A M Medical Record Number: [id number] Patient Account Number: [id number] Date of Birth/Sex: Treating RN: 25-Sep-1955 (67 y.o. Gevena Mart Primary Care Zanobia Griebel: SYSTEM, PRO V IDER Other Clinician: Referring Reinette Cuneo: Treating Shonia Skilling/Extender: Madaline Guthrie in Treatment: 0 Foot Assessment Items Site Locations + = Sensation present, - = Sensation absent, C = Callus, U = Ulcer R = Redness, W = Warmth, M = Maceration, PU = Pre-ulcerative lesion F = Fissure, S = Swelling, D = Dryness Assessment Right: Left: Other Deformity: No No Prior Foot Ulcer: No No Prior Amputation: No  No Charcot Joint: No No Ambulatory Status: Gait: Electronic Signature(s) Signed: 06/22/2022 4:01:24 PM By: Brenton Grills Entered By: Brenton Grills on 06/18/2022 08:34:33 -------------------------------------------------------------------------------- Nutrition Risk Screening Details Patient Name: Date of Service: MELONIE, GERMANI 06/18/2022 8:00 A M Medical Record Number: [id number] Patient Account Number: [id number] Date of Birth/Sex: Treating RN: 09/05/1955 (67 y.o. Gevena Mart Primary Care Harla Mensch: SYSTEM, PRO Rodena Goldmann Other Clinician: Referring Cedar Roseman: Treating Ayden Hardwick/Extender: Madaline Guthrie in Treatment: 0 Height (in): 67 Weight (lbs): 225 Body Mass Index (BMI): 35.2 Hanratty, Nikoletta ([id number]) [id number] [id number] Page 4 of 4 Nutrition Risk Screening Items Score Screening NUTRITION RISK SCREEN: I have an illness or condition that made me change the kind and/or amount of food I eat 0 No I eat fewer than two meals per day 0 No I eat few fruits and vegetables, or milk products 0 No I have three or more drinks of beer, liquor or wine almost every day 0 No I have tooth or mouth problems that make it hard for me to eat 0 No I don't always have enough money to buy the food I need 0 No I eat alone most of the time 0 No I take three or more different prescribed or over-the-counter drugs a day 0 No Without wanting to, I have lost or gained 10 pounds in the last six months 0 No I am not always physically able to shop, cook and/or feed myself 0 No Nutrition Protocols Good Risk Protocol Moderate Risk Protocol High Risk Proctocol Risk Level: Good Risk Score: 0 Electronic Signature(s) Signed: 06/22/2022 4:01:24 PM By: Brenton Grills Entered By: Brenton Grills on 06/18/2022 08:31:14

## 2022-06-23 NOTE — Progress Notes (Signed)
Tara, [name] ([id number]) [id number] Page 1 of 7 Visit Report for 06/18/2022 Allergy List Details Patient Name: Date of Service: [name], [name] 06/18/2022 8:00 A M Medical Record Number: [id number] Patient Account Number: [id number] Date of Birth/Sex: Treating RN: 1955-06-02 (67 y.o. Gevena Mart Primary Care Norena Bratton: SYSTEM, PRO Rodena Goldmann Other Clinician: Referring Kallee Nam: Treating Baljit Liebert/Extender: Madaline Guthrie in Treatment: 0 Allergies Active Allergies No Known Allergies Allergy Notes Electronic Signature(s) Signed: 06/22/2022 4:01:24 PM By: Brenton Grills Entered By: Brenton Grills on 06/18/2022 08:21:10 -------------------------------------------------------------------------------- Arrival Information Details Patient Name: Date of Service: [name] 06/18/2022 8:00 A M Medical Record Number: [id number] Patient Account Number: [id number] Date of Birth/Sex: Treating RN: 1956/01/29 (67 y.o. Gevena Mart Primary Care Nathon Stefanski: SYSTEM, PRO Rodena Goldmann Other Clinician: Referring Mabry Santarelli: Treating Carson Meche/Extender: Madaline Guthrie in Treatment: 0 Visit Information Patient Arrived: Ambulatory Arrival Time: 08:16 Accompanied By: self Transfer Assistance: Manual Patient Identification Verified: Yes Secondary Verification Process Completed: Yes Patient Requires Transmission-Based Precautions: No Patient Has Alerts: No Electronic Signature(s) Signed: 06/22/2022 4:01:24 PM By: Brenton Grills Entered By: Brenton Grills on 06/18/2022 08:17:24 -------------------------------------------------------------------------------- Compression Therapy Details Patient Name: Date of Service: [name] 06/18/2022 8:00 A M Medical Record Number: [id number] Patient Account Number: [id number] Date of Birth/Sex: Treating RN: 1955-10-27 (67 y.o. Gevena Mart Primary Care Kasumi Ditullio: SYSTEM, PRO Rodena Goldmann Other  Clinician: Referring Giulio Bertino: Treating Allesandra Huebsch/Extender: Madaline Guthrie in Treatment: 0 Compression Therapy Performed for Wound Assessment: Wound #1 Left,Medial Ankle Performed By: Clinician Brenton Grills, RN Compression Type: Four Layer Post Procedure Diagnosis Same as Pre-procedure Electronic Signature(s) Azle, [name] ([id number]) [id number] Page 2 of 7 Signed: 06/22/2022 4:01:24 PM By: Brenton Grills Entered By: Brenton Grills on 06/18/2022 09:44:23 -------------------------------------------------------------------------------- Encounter Discharge Information Details Patient Name: Date of Service: [name] 06/18/2022 8:00 A M Medical Record Number: [id number] Patient Account Number: [id number] Date of Birth/Sex: Treating RN: Dec 29, 1955 (67 y.o. Gevena Mart Primary Care Makaylen Thieme: SYSTEM, PRO Rodena Goldmann Other Clinician: Referring Chelsea Nusz: Treating Halena Mohar/Extender: Madaline Guthrie in Treatment: 0 Encounter Discharge Information Items Post Procedure Vitals Discharge Condition: Stable Temperature (F): 97.4 Ambulatory Status: Ambulatory Pulse (bpm): 85 Discharge Destination: Home Respiratory Rate (breaths/min): 20 Transportation: Private Auto Blood Pressure (mmHg): 148/72 Accompanied By: self Schedule Follow-up Appointment: Yes Clinical Summary of Care: Patient Declined Electronic Signature(s) Signed: 06/22/2022 4:01:24 PM By: Brenton Grills Entered By: Brenton Grills on 06/18/2022 09:45:47 -------------------------------------------------------------------------------- Lower Extremity Assessment Details Patient Name: Date of Service: Tara, [name] 06/18/2022 8:00 A M Medical Record Number: [id number] Patient Account Number: [id number] Date of Birth/Sex: Treating RN: 12-30-1955 (67 y.o. Gevena Mart Primary Care Jung Yurchak: SYSTEM, PRO Rodena Goldmann Other Clinician: Referring Elisa Sorlie: Treating  Ricard Faulkner/Extender: Madaline Guthrie in Treatment: 0 Edema Assessment Assessed: [Left: No] [Right: No] [Left: Edema] [Right: :] Calf Left: Right: Point of Measurement: From Medial Instep 40.7 cm Ankle Left: Right: Point of Measurement: From Medial Instep 29.8 cm Vascular Assessment Pulses: Dorsalis Pedis Palpable: [Left:Yes] Blood Pressure: Brachial: [Left:141] Ankle: [Left:Dorsalis Pedis: 142 1.01] Electronic Signature(s) Signed: 06/22/2022 4:01:24 PM By: Brenton Grills Entered By: Brenton Grills on 06/18/2022 08:42:21 Multi Wound Chart Details -------------------------------------------------------------------------------- Carney Living ([id number]) [id number] Page 3 of 7 Patient Name: Date of Service: Tara, [name] 06/18/2022 8:00 A M Medical Record Number: [id number] Patient Account Number: [id number] Date of Birth/Sex: Treating RN: 1955-12-21 (67 y.o. F) Primary Care Coreen Shippee: SYSTEM, PRO V IDER Other Clinician: Referring Kashis Penley: Treating Rosaleen Mazer/Extender: Lady Gary,  Daymon Larsen, Allison Weeks in Treatment: 0 Vital Signs Height(in): 67 Pulse(bpm): 87 Weight(lbs): 225 Blood Pressure(mmHg): 141/94 Body Mass Index(BMI): 35.2 Temperature(F): 97.7 Respiratory Rate(breaths/min): 18 Wound Assessments Wound Number: 1 N/A N/A Photos: N/A N/A Left, Medial Ankle N/A N/A Wound Location: Gradually Appeared N/A N/A Wounding Event: Venous Leg Ulcer N/A N/A Primary Etiology: Colitis, Rheumatoid Arthritis N/A N/A Comorbid History: 11/23/2021 N/A N/A Date Acquired: 0 N/A N/A Weeks of Treatment: Open N/A N/A Wound Status: No N/A N/A Wound Recurrence: 3.8x1.7x0.2 N/A N/A Measurements L x W x D (cm) 5.074 N/A N/A A (cm) : rea 1.015 N/A N/A Volume (cm) : Full Thickness Without Exposed N/A N/A Classification: Support Structures Medium N/A N/A Exudate Amount: Serosanguineous N/A N/A Exudate Type: red, brown N/A  N/A Exudate Color: Distinct, outline attached N/A N/A Wound Margin: Medium (34-66%) N/A N/A Granulation Amount: Red, Pink N/A N/A Granulation Quality: Medium (34-66%) N/A N/A Necrotic Amount: Fat Layer (Subcutaneous Tissue): Yes N/A N/A Exposed Structures: Debridement - Selective/Open Wound N/A N/A Debridement: Pre-procedure Verification/Time Out 09:10 N/A N/A Taken: Lidocaine 4% Topical Solution N/A N/A Pain Control: Slough N/A N/A Tissue Debrided: Non-Viable Tissue N/A N/A Level: 5.07 N/A N/A Debridement A (sq cm): rea Curette N/A N/A Instrument: Minimum N/A N/A Bleeding: Pressure N/A N/A Hemostasis A chieved: 5 N/A N/A Procedural Pain: 5 N/A N/A Post Procedural Pain: Procedure was tolerated well N/A N/A Debridement Treatment Response: 3.8x1.7x0.2 N/A N/A Post Debridement Measurements L x W x D (cm) 1.015 N/A N/A Post Debridement Volume: (cm) Scarring: Yes N/A N/A Periwound Skin Texture: Maceration: No N/A N/A Periwound Skin Moisture: Dry/Scaly: No Hemosiderin Staining: Yes N/A N/A Periwound Skin Color: No Abnormality N/A N/A Temperature: Debridement N/A N/A Procedures Performed: Treatment Notes Electronic Signature(s) Signed: 06/18/2022 9:25:16 AM By: Duanne Guess MD FACS Entered By: Duanne Guess on 06/18/2022 09:25:15 Carney Living ([id number]) [id number] Page 4 of 7 -------------------------------------------------------------------------------- Multi-Disciplinary Care Plan Details Patient Name: Date of Service: ZIZA, HASTINGS 06/18/2022 8:00 A M Medical Record Number: [id number] Patient Account Number: [id number] Date of Birth/Sex: Treating RN: November 19, 1955 (67 y.o. Gevena Mart Primary Care Julienne Vogler: SYSTEM, PRO Rodena Goldmann Other Clinician: Referring Apollonia Amini: Treating Chara Marquard/Extender: Madaline Guthrie in Treatment: 0 Active Inactive Wound/Skin Impairment Nursing Diagnoses: Impaired  tissue integrity Goals: Patient/caregiver will verbalize understanding of skin care regimen Date Initiated: 06/18/2022 Target Resolution Date: 07/24/2022 Goal Status: Active Interventions: Assess patient/caregiver ability to obtain necessary supplies Assess patient/caregiver ability to perform ulcer/skin care regimen upon admission and as needed Assess ulceration(s) every visit Provide education on ulcer and skin care Treatment Activities: Skin care regimen initiated : 06/18/2022 Topical wound management initiated : 06/18/2022 Notes: Electronic Signature(s) Signed: 06/22/2022 4:01:24 PM By: Brenton Grills Entered By: Brenton Grills on 06/18/2022 08:52:21 -------------------------------------------------------------------------------- Pain Assessment Details Patient Name: Date of Service: [name] 06/18/2022 8:00 A M Medical Record Number: [id number] Patient Account Number: [id number] Date of Birth/Sex: Treating RN: [birth year] (67 y.o. Gevena Mart Primary Care [place] Hafen: SYSTEM, PRO Rodena Goldmann Other Clinician: Referring Amarrah Meinhart: Treating Shalisa Mcquade/Extender: Madaline Guthrie in Treatment: 0 Active Problems Location of Pain Severity and Description of Pain Patient Has Paino No Site Locations [place], [place] ([id number]) [id number] Page 5 of 7 Pain Management and Medication Current Pain Management: Electronic Signature(s) Signed: 06/22/2022 4:01:24 PM By: Brenton Grills Entered By: Brenton Grills on 06/18/2022 08:51:16 -------------------------------------------------------------------------------- Patient/Caregiver Education Details Patient Name: Date of Service: [name] 4/25/2024andnbsp8:00 A M Medical Record Number: [id number] Patient Account Number: [id number] Date of  Birth/Gender: Treating RN: 01-Feb-1956 (67 y.o. Gevena Mart Primary Care Physician: SYSTEM, PRO V IDER Other Clinician: Referring Physician: Treating  Physician/Extender: Madaline Guthrie in Treatment: 0 Education Assessment Education Provided To: Patient Education Topics Provided Wound/Skin Impairment: Methods: Explain/Verbal Responses: State content correctly Electronic Signature(s) Signed: 06/22/2022 4:01:24 PM By: Brenton Grills Entered By: Brenton Grills on 06/18/2022 08:52:39 -------------------------------------------------------------------------------- Wound Assessment Details Patient Name: Date of Service: [name] 06/18/2022 8:00 A M Medical Record Number: [id number] Patient Account Number: [id number] Date of Birth/Sex: Treating RN: 1955/09/26 (67 y.o. Gevena Mart Primary Care Nicholson Starace: SYSTEM, PRO V IDER Other Clinician: Referring Chelsae Zanella: Treating Timonthy Hovater/Extender: Madaline Guthrie in Treatment: 0 Wound Status Wound Number: 1 Primary Etiology: Venous Leg Ulcer Wound Location: Left, Medial Ankle Wound Status: Open Wounding Event: Gradually Appeared Comorbid History: Colitis, Rheumatoid Arthritis Date Acquired: 11/23/2021 Weeks Of Treatment: 0 Clustered Wound: No Photos Wound Measurements VARAS, Julieanna ([id number]) Length: (cm) 3.8 Width: (cm) 1.7 Depth: (cm) 0.2 Area: (cm) 5.074 Volume: (cm) 1.015 [id number] Page 6 of 7 % Reduction in Area: % Reduction in Volume: Tunneling: No Undermining: No Wound Description Classification: Full Thickness Without Exposed Suppor Wound Margin: Distinct, outline attached Exudate Amount: Medium Exudate Type: Serosanguineous Exudate Color: red, brown t Structures Foul Odor After Cleansing: No Slough/Fibrino No Wound Bed Granulation Amount: Medium (34-66%) Exposed Structure Granulation Quality: Red, Pink Fat Layer (Subcutaneous Tissue) Exposed: Yes Necrotic Amount: Medium (34-66%) Necrotic Quality: Adherent Slough Periwound Skin Texture Texture Color No Abnormalities Noted: No No  Abnormalities Noted: No Scarring: Yes Hemosiderin Staining: Yes Moisture Temperature / Pain No Abnormalities Noted: No Temperature: No Abnormality Dry / Scaly: No Maceration: No Treatment Notes Wound #1 (Ankle) Wound Laterality: Left, Medial Cleanser Soap and Water Discharge Instruction: May shower and wash wound with dial antibacterial soap and water prior to dressing change. Peri-Wound Care Sween Lotion (Moisturizing lotion) Discharge Instruction: Apply moisturizing lotion as directed Topical Primary Dressing IODOFLEX 0.9% Cadexomer Iodine Pad 4x6 cm Discharge Instruction: Apply to wound bed as instructed Secondary Dressing Secured With Paper Tape, 2x10 (in/yd) Discharge Instruction: Secure dressing with tape as directed. Compression Wrap Urgo K2 Lite, two layer compression system, regular Discharge Instruction: Apply Urgo K2 Lite as directed (alternative to 3 layer compression). Compression Stockings Add-Ons Electronic Signature(s) Signed: 06/22/2022 4:01:24 PM By: Brenton Grills Entered By: Brenton Grills on 06/18/2022 08:50:51 -------------------------------------------------------------------------------- Vitals Details Patient Name: Date of Service: [name], AIDEL 06/18/2022 8:00 A M Medical Record Number: [id number] Patient Account Number: [id number] Date of Birth/Sex: Treating RN: 20-Nov-1955 (67 y.o. Gevena Mart Primary Care Adalei Novell: SYSTEM, PRO Rodena Goldmann Other Clinician: Referring Beadie Matsunaga: Treating Cherylyn Sundby/Extender: Lacole, Komorowski, [name] ([id number]) [id number].pdf Page 7 of 7 Weeks in Treatment: 0 Vital Signs Time Taken: 08:15 Temperature (F): 97.7 Height (in): 67 Pulse (bpm): 87 Weight (lbs): 225 Respiratory Rate (breaths/min): 18 Body Mass Index (BMI): 35.2 Blood Pressure (mmHg): 141/94 Reference Range: 80 - 120 mg / dl Electronic Signature(s) Signed: 06/22/2022 4:01:24 PM By: Brenton Grills Entered  By: Brenton Grills on 06/18/2022 08:20:57

## 2022-06-24 ENCOUNTER — Ambulatory Visit: Payer: Medicare Other | Admitting: Podiatry

## 2022-06-25 ENCOUNTER — Encounter (HOSPITAL_BASED_OUTPATIENT_CLINIC_OR_DEPARTMENT_OTHER): Payer: Medicare Other | Attending: General Surgery | Admitting: General Surgery

## 2022-06-25 DIAGNOSIS — I872 Venous insufficiency (chronic) (peripheral): Secondary | ICD-10-CM | POA: Insufficient documentation

## 2022-06-25 DIAGNOSIS — L97321 Non-pressure chronic ulcer of left ankle limited to breakdown of skin: Secondary | ICD-10-CM | POA: Insufficient documentation

## 2022-06-25 DIAGNOSIS — K5190 Ulcerative colitis, unspecified, without complications: Secondary | ICD-10-CM | POA: Insufficient documentation

## 2022-06-25 DIAGNOSIS — L97322 Non-pressure chronic ulcer of left ankle with fat layer exposed: Secondary | ICD-10-CM | POA: Diagnosis present

## 2022-06-25 DIAGNOSIS — M069 Rheumatoid arthritis, unspecified: Secondary | ICD-10-CM | POA: Diagnosis not present

## 2022-07-02 ENCOUNTER — Ambulatory Visit: Payer: Medicare Other | Admitting: Podiatry

## 2022-07-02 ENCOUNTER — Other Ambulatory Visit (HOSPITAL_COMMUNITY): Payer: Self-pay | Admitting: General Surgery

## 2022-07-02 ENCOUNTER — Ambulatory Visit (HOSPITAL_COMMUNITY)
Admission: RE | Admit: 2022-07-02 | Discharge: 2022-07-02 | Disposition: A | Discharge status: Home / Self Care | Payer: Medicare Other | Source: Ambulatory Visit | Attending: Vascular Surgery | Admitting: Vascular Surgery

## 2022-07-02 ENCOUNTER — Encounter (HOSPITAL_BASED_OUTPATIENT_CLINIC_OR_DEPARTMENT_OTHER): Payer: Medicare Other | Admitting: General Surgery

## 2022-07-02 DIAGNOSIS — L97322 Non-pressure chronic ulcer of left ankle with fat layer exposed: Secondary | ICD-10-CM | POA: Diagnosis not present

## 2022-07-02 DIAGNOSIS — L97501 Non-pressure chronic ulcer of other part of unspecified foot limited to breakdown of skin: Secondary | ICD-10-CM | POA: Insufficient documentation

## 2022-07-02 DIAGNOSIS — M778 Other enthesopathies, not elsewhere classified: Secondary | ICD-10-CM

## 2022-07-02 NOTE — Progress Notes (Signed)
He presents today for follow-up of her capsulitis forefoot bilaterally.  She states that she is seeing wound care she is seeing vein and vascular and she is also seeing vascular.  Objective: Vital signs stable alert oriented x 3.  Pulses are palpable.  She relates that the capsulitis of the right foot is about 40% better still tender around the second metatarsophalangeal joint on palpation and range of motion.  She also has tenderness on palpation to the second metatarsophalangeal joint of the left foot but relates that approximately 80% improved.  States that 1 really does not give her a lot of pain.  She does have her left leg wrapped from wound care.  States that she is getting that changed today.  Assessment: Venous insufficiency bilateral venous ulceration left foot being treated by wound care.  Capsulitis of the second metatarsophalangeal joint bilateral pes planovalgus bilateral.  Plan: At this point we reinjected.  Articularly around the second metatarsal phalangeal joint of the right foot with 10 mg Kenalog 5 mg Marcaine point maximal tenderness.  I will follow-up with her in about 2 months if necessary.

## 2022-07-03 ENCOUNTER — Encounter (HOSPITAL_BASED_OUTPATIENT_CLINIC_OR_DEPARTMENT_OTHER): Payer: Medicare Other | Admitting: General Surgery

## 2022-07-03 DIAGNOSIS — L97322 Non-pressure chronic ulcer of left ankle with fat layer exposed: Secondary | ICD-10-CM | POA: Diagnosis not present

## 2022-07-04 NOTE — Progress Notes (Signed)
ANTWANIQUE, JUNCKER ([id number]) [id number] Page 1 of 1 Visit Report for 07/03/2022 SuperBill Details Patient Name: Date of Service: [name], [name] 07/03/2022 Medical Record Number: [id number] Patient Account Number: [id number] Date of Birth/Sex: Treating RN: 27-Nov-1955 (67 y.o. 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

## 2022-07-04 NOTE — Progress Notes (Signed)
[name], [name] ([id number]) [id number] Page 1 of 3 Visit Report for 07/03/2022 Arrival Information Details Patient Name: Date of Service: [name], [name] 07/03/2022 3:30 PM Medical Record Number: [id number] Patient Account Number: [id number] Date of Birth/Sex: Treating RN: 1955/03/26 (67 y.o. [name], [name] Primary Care [name]: SYSTEM, PRO V IDER Other Clinician: Referring [name]: Treating [name]/Extender: Madaline Guthrie in Treatment: 2 Visit Information History Since Last Visit Added or deleted any medications: No Patient Arrived: Ambulatory Any new allergies or adverse reactions: No Arrival Time: 15:43 Had a fall or experienced change in No Accompanied By: self activities of daily living that may affect Transfer Assistance: None risk of falls: Patient Identification Verified: Yes Signs or symptoms of abuse/neglect since last visito No Secondary Verification Process Completed: Yes Hospitalized since last visit: No Patient Requires Transmission-Based Precautions: No Implantable device outside of the clinic excluding No Patient Has Alerts: No cellular tissue based products placed in the center since last visit: Has Dressing in Place as Prescribed: Yes Pain Present Now: No Electronic Signature(s) Signed: 07/03/2022 3:58:37 PM By: Samuella [name] Entered By: Samuella [name] on 07/03/2022 15:44:11 -------------------------------------------------------------------------------- Compression Therapy Details Patient Name: Date of Service: [name], [name] 07/03/2022 3:30 PM Medical Record Number: [id number] Patient Account Number: [id number] Date of Birth/Sex: Treating RN: [birth year] (67 y.o. [name] Primary Care [name]: SYSTEM, PRO Rodena Goldmann Other Clinician: Referring [name]: Treating [name]/Extender: Madaline Guthrie in Treatment: 2 Compression Therapy Performed for Wound Assessment:  Wound #1 Left,Medial Ankle Performed By: Clinician Samuella Bruin, RN Compression Type: Double Layer Electronic Signature(s) Signed: 07/03/2022 3:58:37 PM By: [name] By: Samuella [name] on 07/03/2022 15:44:50 -------------------------------------------------------------------------------- Encounter Discharge Information Details Patient Name: Date of Service: [name] 07/03/2022 3:30 PM Medical Record Number: [id number] Patient Account Number: [id number] Date of Birth/Sex: Treating RN: Sep 29, 1955 (67 y.o. [name] Primary Care Lakyia Behe: SYSTEM, PRO Rodena Goldmann Other Clinician: Referring [name]: Treating [name]/Extender: Madaline Guthrie in Treatment: 2 Encounter Discharge Information Items Discharge Condition: Stable Ambulatory Status: Ambulatory Discharge Destination: Home Transportation: Private Auto Accompanied By: self Schedule Follow-up Appointment: Yes Clinical Summary of Care: Patient [name] ([id number]) [id number] Page 2 of 3 Electronic Signature(s) Signed: 07/03/2022 3:58:37 PM By: [name] By: Samuella [name] on 07/03/2022 15:45:11 -------------------------------------------------------------------------------- Patient/Caregiver Education Details Patient Name: Date of Service: [name] 5/10/2024andnbsp3:30 PM Medical Record Number: [id number] Patient Account Number: [id number] Date of Birth/Gender: Treating RN: 1955/08/24 (67 y.o. [name] Primary Care Physician: SYSTEM, PRO V IDER Other Clinician: Referring Physician: Treating Physician/Extender: Madaline Guthrie in Treatment: 2 Education Assessment Education Provided To: Patient Education Topics Provided Venous: Methods: Explain/Verbal Responses: Reinforcements needed, State content correctly Electronic Signature(s) Signed: 07/03/2022 3:58:37 PM By:  Samuella [name] Entered By: Samuella [name] on 07/03/2022 15:45:03 -------------------------------------------------------------------------------- Wound Assessment Details Patient Name: Date of Service: [name], [name] 07/03/2022 3:30 PM Medical Record Number: [id number] Patient Account Number: [id number] Date of Birth/Sex: Treating RN: 1955/12/29 (67 y.o. [name] Primary Care Alzina Golda: SYSTEM, PRO V IDER Other Clinician: Referring Garnell [name]: Treating Eulla Kochanowski/Extender: Madaline Guthrie in Treatment: 2 Wound Status Wound Number: 1 Primary Etiology: Venous Leg Ulcer Wound Location: Left, Medial Ankle Wound Status: Open Wounding Event: Gradually Appeared Comorbid History: Colitis, Rheumatoid Arthritis Date Acquired: 11/23/2021 Weeks Of Treatment: 2 Clustered Wound: No Wound Measurements Length: (cm) 3.4 Width: (cm) 7 Depth: (cm) 0.1 Area: (cm) 18.692 Volume: (cm) 1.869 % Reduction in Area: -268.4% % Reduction in  Volume: -84.1% Wound Description Classification: Full Thickness Without Exposed Suppo Wound Margin: Distinct, outline attached Exudate Amount: Medium Exudate Type: Serosanguineous Exudate Color: red, brown rt Structures Foul Odor After Cleansing: No Slough/Fibrino No Wound Bed Granulation Amount: Medium (34-66%) Exposed Structure Granulation Quality: Red, Pink Fat Layer (Subcutaneous Tissue) Exposed: Yes Necrotic Amount: Medium (34-66%) Necrotic Quality: [address] [place], [place] ([id number]) [id number] Page 3 of 3 Periwound Skin Texture Texture Color No Abnormalities Noted: No No Abnormalities Noted: No Scarring: Yes Hemosiderin Staining: Yes Moisture Temperature / Pain No Abnormalities Noted: No Temperature: No Abnormality Dry / Scaly: No Maceration: No Treatment Notes Wound #1 (Ankle) Wound Laterality: Left, Medial Cleanser Soap and Water Discharge Instruction: May shower and wash  wound with dial antibacterial soap and water prior to dressing change. Peri-Wound Care Triamcinolone 15 (g) Discharge Instruction: Use triamcinolone 15 (g) as directed Zinc Oxide Ointment 30g tube Discharge Instruction: Apply Zinc Oxide to periwound with each dressing change Sween Lotion (Moisturizing lotion) Discharge Instruction: Apply moisturizing lotion as directed Topical Ketoconazole Cream 2% Discharge Instruction: Apply Ketoconazole as directed Primary Dressing Secondary Dressing Secured With Paper Tape, 2x10 (in/yd) Discharge Instruction: Secure dressing with tape as directed. Compression Wrap Urgo K2 Lite, two layer compression system, regular Discharge Instruction: Apply Urgo K2 Lite as directed (alternative to 3 layer compression). Compression Stockings Add-Ons Electronic Signature(s) Signed: 07/03/2022 3:58:37 PM By: Samuella [name] Entered By: Samuella [name] on 07/03/2022 15:44:25

## 2022-07-13 ENCOUNTER — Encounter (HOSPITAL_BASED_OUTPATIENT_CLINIC_OR_DEPARTMENT_OTHER): Payer: Medicare Other | Admitting: General Surgery

## 2022-07-13 DIAGNOSIS — L97322 Non-pressure chronic ulcer of left ankle with fat layer exposed: Secondary | ICD-10-CM | POA: Diagnosis not present

## 2022-07-22 ENCOUNTER — Ambulatory Visit: Payer: Medicare Other | Admitting: Vascular Surgery

## 2022-07-22 ENCOUNTER — Encounter: Payer: Self-pay | Admitting: Vascular Surgery

## 2022-07-22 ENCOUNTER — Encounter (HOSPITAL_BASED_OUTPATIENT_CLINIC_OR_DEPARTMENT_OTHER): Payer: Medicare Other | Admitting: General Surgery

## 2022-07-22 VITALS — BP 137/94 | HR 78 | Temp 98.0°F | Resp 18 | Ht 65.5 in | Wt 228.1 lb

## 2022-07-22 DIAGNOSIS — I872 Venous insufficiency (chronic) (peripheral): Secondary | ICD-10-CM

## 2022-07-22 DIAGNOSIS — L97322 Non-pressure chronic ulcer of left ankle with fat layer exposed: Secondary | ICD-10-CM | POA: Diagnosis not present

## 2022-07-22 NOTE — Progress Notes (Signed)
ASSESSMENT & PLAN   VENOUS STASIS ULCER: This patient has a venous stasis ulcer of the left leg.  She has been making excellent progress at the wound care center and the wound is almost healed.  The wound started in October 2023.  The wound care center began working on this in April.  On exam she does have a biphasic dorsalis pedis signal with a monophasic posterior tibial signal.  Thus she may have some underlying infrainguinal arterial occlusive disease.  I would like to see her back in approximately 6 weeks to be sure that the wound is continuing to heal.  At that time we will get a follow-up arterial Doppler study.  She tells me she had an arterial Doppler study done elsewhere but I have been unable to locate that study.  If there is any concerns about healing I think she would need an arteriogram.  With respect to her venous disease, I have encouraged her to avoid prolonged sitting and standing.  We have discussed the importance of exercise.  She will continue with the Unna boot for now.  Once the wound has healed I would recommend a knee-high compression stocking with a gradient of 20 to 30 mmHg.  We also discussed the importance of leg elevation and the proper positioning for this.  The only other consideration from a venous standpoint would be to address the reflux in the left anterior accessory saphenous vein.  However this is a fairly short segment and I am not sure this would have a big impact on her venous hypertension distally.  On my exam today I did not see significant dilation of the proximal left great saphenous vein.  Previous study did demonstrate some reflux in the left great saphenous vein proximally.  REASON FOR CONSULT:    Chronic venous insufficiency.  The consult is requested by Dr. Duanne Guess.   HPI:   Addysen Barmore is a 67 y.o. female who presents with a venous ulcer on her left leg.  She developed an ulcer in October 2023.  She has been followed in the wound care center  since April and they have made excellent progress with her wound.  She is currently getting an Radio broadcast assistant changed weekly and is scheduled to have this replaced today.  I do not get a history of claudication or rest pain.  She does elevate her legs.  She tries to avoid prolonged sitting and gets up and walks at work quite a bit when she can.  She denies any history of diabetes.  She is not a smoker.   Past Medical History:  Diagnosis Date   Arthritis    C. difficile diarrhea    Heart attack (HCC) 10/24/2008   HLD (hyperlipidemia)    HTN (hypertension)    UC (ulcerative colitis) (HCC)     Family History  Problem Relation Age of Onset   Stroke Mother    Congestive Heart Failure Father    Obesity Sister    Hypertension Sister    Diabetes Brother    Thyroid disease Daughter    Colon cancer Neg Hx    Stomach cancer Neg Hx     SOCIAL HISTORY: Social History   Tobacco Use   Smoking status: Never   Smokeless tobacco: Never  Substance Use Topics   Alcohol use: Not Currently    Allergies  Allergen Reactions   Prednisone Other (See Comments)    Tachycardia, nervous, sweating , jitters    Current Outpatient Medications  Medication Sig Dispense Refill   acetaminophen (TYLENOL) 500 MG tablet Take 500 mg by mouth every 6 (six) hours as needed (For arthritis pain.).      Adalimumab (HUMIRA PEN) 40 MG/0.4ML PNKT Inject 40 mg into the skin every 14 (fourteen) days. After starter kit. 2 each 11   Adalimumab (HUMIRA PEN-CD/UC/HS STARTER) 40 MG/0.8ML PNKT Inject 40 mg into the skin as directed. Needs starter pack: Inject 160 mg [place] on day 1, 80 mg  on day 15, then maintenance dose. 1 each 0   Calcium Carb-Cholecalciferol (CALCIUM-VITAMIN D) 600-400 MG-UNIT TABS Take 1 tablet by mouth daily.     dicyclomine (BENTYL) 10 MG capsule Take 1 capsule (10 mg total) by mouth every 8 (eight) hours as needed for spasms. 90 capsule 0   metoprolol succinate (TOPROL-XL) 50 MG 24 hr tablet Take 50 mg  by mouth daily. Take with or immediately following a meal.     Multiple Vitamin (MULTIVITAMIN) tablet Take 1 tablet by mouth daily.     ondansetron (ZOFRAN) 4 MG tablet Take 1 tablet (4 mg total) by mouth every 8 (eight) hours as needed for nausea or vomiting. 30 tablet 1   ondansetron (ZOFRAN-ODT) 4 MG disintegrating tablet Take 1 tablet (4 mg total) by mouth every 8 (eight) hours as needed for nausea or vomiting. 12 tablet 1   oxyCODONE-acetaminophen (PERCOCET/ROXICET) 5-325 MG tablet Take 1 tablet by mouth every 6 (six) hours as needed for severe pain. 15 tablet 0   tamsulosin (FLOMAX) 0.4 MG CAPS capsule Take 1 capsule (0.4 mg total) by mouth daily. 30 capsule 0   cephALEXin (KEFLEX) 500 MG capsule Take 1 capsule (500 mg total) by mouth 4 (four) times daily. (Patient not taking: Reported on 07/22/2022) 28 capsule 0   cephALEXin (KEFLEX) 500 MG capsule Take 1 capsule (500 mg total) by mouth 4 (four) times daily. (Patient not taking: Reported on 07/22/2022) 28 capsule 0   hydrocortisone (ANUSOL-HC) 25 MG suppository Place 1 suppository (25 mg total) rectally 2 (two) times daily. (Patient not taking: Reported on 07/22/2022) 60 suppository 0   ondansetron (ZOFRAN-ODT) 4 MG disintegrating tablet Take 1 tablet (4 mg total) by mouth every 8 (eight) hours as needed for nausea or vomiting. (Patient not taking: Reported on 07/22/2022) 20 tablet 0   predniSONE (DELTASONE) 20 MG tablet Take 1-2 tablets (20-40 mg total) by mouth daily with breakfast. Take 2 tablets (40mg ) daily x 14 days, then 1.5 tablets(30mg ) daily x 14 days, then 1 tablets (20mg ) daily (Patient not taking: Reported on 07/22/2022) 90 tablet 0   No current facility-administered medications for this visit.    REVIEW OF SYSTEMS:  [X]  denotes positive finding, [ ]  denotes negative finding Cardiac  Comments:  Chest pain or chest pressure:    Shortness of breath upon exertion:    Short of breath when lying flat:    Irregular heart rhythm:         Vascular    Pain in calf, thigh, or hip brought on by ambulation:    Pain in feet at night that wakes you up from your sleep:     Blood clot in your veins:    Leg swelling:         Pulmonary    Oxygen at home:    Productive cough:     Wheezing:         Neurologic    Sudden weakness in arms or legs:     Sudden numbness in arms or  legs:     Sudden onset of difficulty speaking or slurred speech:    Temporary loss of vision in one eye:     Problems with dizziness:         Gastrointestinal    Blood in stool:     Vomited blood:         Genitourinary    Burning when urinating:     Blood in urine:        Psychiatric    Major depression:         Hematologic    Bleeding problems:    Problems with blood clotting too easily:        Skin    Rashes or ulcers:        Constitutional    Fever or chills:    -  PHYSICAL EXAM:   Vitals:   07/22/22 1102  BP: (!) 137/94  Pulse: 78  Resp: 18  Temp: 98 F (36.7 C)  TempSrc: Temporal  SpO2: 97%  Weight: 228 lb 1.6 oz (103.5 kg)  Height: 5' 5.5" (1.664 m)   Body mass index is 37.38 kg/m. GENERAL: The patient is a well-nourished female, in no acute distress. The vital signs are documented above. CARDIAC: There is a regular rate and rhythm.  VASCULAR: I do not detect carotid bruits. She has palpable femoral pulses.  I cannot palpate popliteal or pedal pulses. On the left side she has a biphasic dorsalis pedis signal with a monophasic posterior tibial signal.  The posterior tibial signal is fairly brisk. I did look at her great saphenous vein and anterior accessory saphenous vein myself in the thigh and it appeared to me that the anterior accessory saphenous vein was the vein that had significant reflux and was dilated. 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 PULMONARY: There is good air exchange bilaterally without wheezing or rales. ABDOMEN: Soft and non-tender with normal pitched bowel sounds.   MUSCULOSKELETAL: There are no major deformities. NEUROLOGIC: No focal weakness or paresthesias are detected. SKIN: The wound at her left leg has almost completely healed.  PSYCHIATRIC: The patient has a normal affect.  DATA:    VENOUS DUPLEX: This patient had bilateral lower extremity venous duplex scans done on 07/02/2022.  The results of the studies are summarized on the 2 diagrams below.  On the right side there was no evidence of DVT.  There was deep venous reflux in the femoral vein.  There was superficial venous reflux in the right great saphenous vein from the saphenofemoral junction to the mid thigh.  Diameters of the vein ranged from 4-6 mm.  There was reflux in the anterior accessory saphenous vein which measured 4.5 mm in diameter.  On the left side there was no evidence of DVT.  There was no deep venous reflux documented.  There was superficial venous reflux noted in the proximal left great saphenous vein in the thigh.  It was 5.3 mm in diameter.  There was reflux in the left anterior accessory saphenous vein which measured 5.4 mm in diameter.       Waverly Ferrari [facility]

## 2022-07-24 NOTE — Progress Notes (Signed)
[name], [name] ([id number]) [id number] Page 1 of 1 Visit Report for 07/22/2022 SuperBill Details Patient Name: Date of Service: [name], [name] 07/22/2022 Medical Record Number: [id number] Patient Account Number: [id number] Date of Birth/Sex: Treating RN: [birth year] (67 y.o. Tommye Standard Primary Care Provider: Elesa Massed Other Clinician: Referring Provider: Treating Provider/Extender: Madaline Guthrie in Treatment: 4 Diagnosis Coding ICD-10 Codes Code Description [phone] Non-pressure chronic ulcer of left ankle with fat layer exposed L97.321 Non-pressure chronic ulcer of left ankle limited to breakdown of skin I87.2 Venous insufficiency (chronic) (peripheral) K51.90 Ulcerative colitis, unspecified, without complications Facility Procedures CPT4 Code Description Modifier Quantity [phone] (Facility Use Only) [phone] - APPLY MULTLAY COMPRS LWR LT LEG 1 Electronic Signature(s) Signed: 07/22/2022 4:32:59 PM By: Zenaida Deed RN, BSN Signed: 07/23/2022 8:19:29 AM By: Duanne Guess MD FACS Entered By: Zenaida Deed on 07/22/2022 16:17:35

## 2022-07-27 ENCOUNTER — Encounter (HOSPITAL_BASED_OUTPATIENT_CLINIC_OR_DEPARTMENT_OTHER): Payer: Medicare Other | Attending: General Surgery | Admitting: General Surgery

## 2022-07-27 DIAGNOSIS — L97321 Non-pressure chronic ulcer of left ankle limited to breakdown of skin: Secondary | ICD-10-CM | POA: Diagnosis not present

## 2022-07-27 DIAGNOSIS — K51919 Ulcerative colitis, unspecified with unspecified complications: Secondary | ICD-10-CM | POA: Diagnosis not present

## 2022-07-27 DIAGNOSIS — I872 Venous insufficiency (chronic) (peripheral): Secondary | ICD-10-CM | POA: Diagnosis not present

## 2022-07-27 DIAGNOSIS — Z833 Family history of diabetes mellitus: Secondary | ICD-10-CM | POA: Insufficient documentation

## 2022-07-27 DIAGNOSIS — I10 Essential (primary) hypertension: Secondary | ICD-10-CM | POA: Diagnosis not present

## 2022-07-27 DIAGNOSIS — L97322 Non-pressure chronic ulcer of left ankle with fat layer exposed: Secondary | ICD-10-CM | POA: Diagnosis present

## 2022-07-27 DIAGNOSIS — M069 Rheumatoid arthritis, unspecified: Secondary | ICD-10-CM | POA: Diagnosis not present

## 2022-07-28 ENCOUNTER — Other Ambulatory Visit: Payer: Self-pay

## 2022-07-28 DIAGNOSIS — I70248 Atherosclerosis of native arteries of left leg with ulceration of other part of lower left leg: Secondary | ICD-10-CM

## 2022-08-04 ENCOUNTER — Encounter (HOSPITAL_BASED_OUTPATIENT_CLINIC_OR_DEPARTMENT_OTHER): Payer: Medicare Other | Admitting: General Surgery

## 2022-08-04 DIAGNOSIS — L97322 Non-pressure chronic ulcer of left ankle with fat layer exposed: Secondary | ICD-10-CM | POA: Diagnosis not present

## 2022-08-04 NOTE — Progress Notes (Signed)
[name], [name] ([id number]) [id number] Page 1 of 7 Visit Report for 07/02/2022 Arrival Information Details Patient Name: Date of Service: [name], [name] 07/02/2022 2:00 PM Medical Record Number: [id number] Patient Account Number: [id number] Date of Birth/Sex: Treating RN: 1955/06/16 (67 y.o. Gevena Mart Primary Care Denym Rahimi: Elesa Massed Other Clinician: Referring Arlen Dupuis: Treating Kalel Harty/Extender: Madaline Guthrie in Treatment: 2 Visit Information History Since Last Visit All ordered tests and consults were completed: Yes Patient Arrived: Ambulatory Added or deleted any medications: No Arrival Time: 14:15 Any new allergies or adverse reactions: No Accompanied By: self Had a fall or experienced change in No Transfer Assistance: None activities of daily living that may affect Patient Identification Verified: Yes risk of falls: Secondary Verification Process Completed: Yes Signs or symptoms of abuse/neglect since last visito No Patient Requires Transmission-Based Precautions: No Hospitalized since last visit: No Patient Has Alerts: No Implantable device outside of the clinic excluding No cellular tissue based products placed in the center since last visit: Has Dressing in Place as Prescribed: Yes Pain Present Now: No Electronic Signature(s) Signed: 08/04/2022 7:49:05 AM By: Brenton Grills Entered By: Brenton Grills on 07/02/2022 14:15:35 -------------------------------------------------------------------------------- Encounter Discharge Information Details Patient Name: Date of Service: [name] 07/02/2022 2:00 PM Medical Record Number: [id number] Patient Account Number: [id number] Date of Birth/Sex: Treating RN: Jul 16, 1955 (67 y.o. Gevena Mart Primary Care Elis Rawlinson: Elesa Massed Other Clinician: Referring Levoy Geisen: Treating Babatunde Seago/Extender: Madaline Guthrie in Treatment: 2 Encounter  Discharge Information Items Post Procedure Vitals Discharge Condition: Stable Temperature (F): 98.1 Ambulatory Status: Ambulatory Pulse (bpm): 80 Discharge Destination: Home Respiratory Rate (breaths/min): 18 Transportation: Private Auto Blood Pressure (mmHg): 138/72 Accompanied By: self Schedule Follow-up Appointment: Yes Clinical Summary of Care: Patient Declined Notes Schedule nurse visit for tomorrow. Electronic Signature(s) Signed: 08/04/2022 7:49:05 AM By: Brenton Grills Entered By: Brenton Grills on 07/02/2022 15:00:27 -------------------------------------------------------------------------------- Lower Extremity Assessment Details Patient Name: Date of Service: [name], [name] 07/02/2022 2:00 PM Medical Record Number: [id number] Patient Account Number: [id number] Date of Birth/Sex: Treating RN: 08-31-1955 (67 y.o. Gevena Mart Primary Care Kobie Matkins: Elesa Massed Other Clinician: Referring Ta Fair: Treating Zanita Millman/Extender: Madaline Guthrie in Treatment: 2 [place], [name] ([id number]) [id number] Page 2 of 7 Edema Assessment Assessed: [Left: No] [Right: No] [Left: Edema] [Right: :] Calf Left: Right: Point of Measurement: From Medial Instep 36 cm Ankle Left: Right: Point of Measurement: From Medial Instep 26 cm Vascular Assessment Pulses: Dorsalis Pedis Palpable: [Left:Yes] Electronic Signature(s) Signed: 08/04/2022 7:49:05 AM By: Brenton Grills Entered By: Brenton Grills on 07/02/2022 14:23:40 -------------------------------------------------------------------------------- Multi Wound Chart Details Patient Name: Date of Service: [name] 07/02/2022 2:00 PM Medical Record Number: [id number] Patient Account Number: [id number] Date of Birth/Sex: Treating RN: [birth year] (67 y.o. F) Primary Care Kammy Klett: Elesa Massed Other Clinician: Referring Rashonda Warrior: Treating Kingsley Farace/Extender: Madaline Guthrie in Treatment: 2 Vital Signs Height(in): 67 Pulse(bpm): 93 Weight(lbs): 225 Blood Pressure(mmHg): 146/88 Body Mass Index(BMI): 35.2 Temperature(F): 97.6 Respiratory Rate(breaths/min): 18 [1:Photos:] [2:No Photos] [N/A:N/A] Left, Medial Ankle Right, Medial Lower Leg N/A Wound Location: Gradually Appeared Blister N/A Wounding Event: Venous Leg Ulcer Venous Leg Ulcer N/A Primary Etiology: Colitis, Rheumatoid Arthritis N/A N/A Comorbid History: 11/23/2021 06/25/2022 N/A Date Acquired: 2 1 N/A Weeks of Treatment: Open Healed - Epithelialized N/A Wound Status: No No N/A Wound Recurrence: 3.4x7x0.1 0x0x0 N/A Measurements L x W x D (cm) 18.692 0 N/A A (cm) : rea 1.869 0 N/A Volume (cm) : -268.40% 100.00% N/A % Reduction  in A rea: -84.10% 100.00% N/A % Reduction in Volume: Full Thickness Without Exposed Full Thickness Without Exposed N/A Classification: Support Structures Support Structures Medium Medium N/A Exudate Amount: Serosanguineous Serosanguineous N/A Exudate Type: red, brown red, brown N/A Exudate Color: Distinct, outline attached N/A N/A Wound Margin: Medium (34-66%) N/A N/A Granulation Amount: Red, Pink N/A N/A Granulation QualityTANIS, HENSARLING ([id number]) [id number] Page 3 of 7 Medium (34-66%) N/A N/A Necrotic Amount: Fat Layer (Subcutaneous Tissue): Yes N/A N/A Exposed Structures: Debridement - Excisional N/A N/A Debridement: 14:30 N/A N/A Pre-procedure Verification/Time Out Taken: Lidocaine 4% Topical Solution N/A N/A Pain Control: Subcutaneous, Slough N/A N/A Tissue Debrided: Skin/Subcutaneous Tissue N/A N/A Level: 1.87 N/A N/A Debridement A (sq cm): rea Curette N/A N/A Instrument: Minimum N/A N/A Bleeding: Pressure N/A N/A Hemostasis A chieved: 0 N/A N/A Procedural Pain: 0 N/A N/A Post Procedural Pain: Procedure was tolerated well N/A N/A Debridement Treatment Response: 3.4x0.7x0.1 N/A  N/A Post Debridement Measurements L x W x D (cm) 0.187 N/A N/A Post Debridement Volume: (cm) Scarring: Yes No Abnormalities Noted N/A Periwound Skin Texture: Maceration: No No Abnormalities Noted N/A Periwound Skin Moisture: Dry/Scaly: No Hemosiderin Staining: Yes No Abnormalities Noted N/A Periwound Skin Color: No Abnormality N/A N/A Temperature: Debridement N/A N/A Procedures Performed: Treatment Notes Wound #1 (Ankle) Wound Laterality: Left, Medial Cleanser Soap and Water Discharge Instruction: May shower and wash wound with dial antibacterial soap and water prior to dressing change. Peri-Wound Care Triamcinolone 15 (g) Discharge Instruction: Use triamcinolone 15 (g) as directed Zinc Oxide Ointment 30g tube Discharge Instruction: Apply Zinc Oxide to periwound with each dressing change Sween Lotion (Moisturizing lotion) Discharge Instruction: Apply moisturizing lotion as directed Topical Ketoconazole Cream 2% Discharge Instruction: Apply Ketoconazole as directed Primary Dressing Secondary Dressing Secured With Paper Tape, 2x10 (in/yd) Discharge Instruction: Secure dressing with tape as directed. Compression Wrap Urgo K2 Lite, two layer compression system, regular Discharge Instruction: Apply Urgo K2 Lite as directed (alternative to 3 layer compression). Compression Stockings Add-Ons Wound #2 (Lower Leg) Wound Laterality: Right, Medial Cleanser Peri-Wound Care Topical Primary Dressing Secondary Dressing Secured With Compression Wrap Compression Stockings Add-Ons [name], [name] ([id number]) [id number] Page 4 of 7 Electronic Signature(s) Signed: 07/02/2022 3:26:02 PM By: Duanne Guess MD FACS Entered By: Duanne Guess on 07/02/2022 15:26:02 -------------------------------------------------------------------------------- Multi-Disciplinary Care Plan Details Patient Name: Date of Service: [name] 07/02/2022 2:00 PM Medical Record  Number: [id number] Patient Account Number: [id number] Date of Birth/Sex: Treating RN: [birth year] (67 y.o. Gevena Mart Primary Care Merlie Noga: Elesa Massed Other Clinician: Referring Marjani Kobel: Treating Erik Nessel/Extender: Madaline Guthrie in Treatment: 2 Active Inactive Wound/Skin Impairment Nursing Diagnoses: Impaired tissue integrity Goals: Patient/caregiver will verbalize understanding of skin care regimen Date Initiated: 06/18/2022 Target Resolution Date: 07/24/2022 Goal Status: Active Interventions: Assess patient/caregiver ability to obtain necessary supplies Assess patient/caregiver ability to perform ulcer/skin care regimen upon admission and as needed Assess ulceration(s) every visit Provide education on ulcer and skin care Treatment Activities: Skin care regimen initiated : 06/18/2022 Topical wound management initiated : 06/18/2022 Notes: Electronic Signature(s) Signed: 08/04/2022 7:49:05 AM By: Brenton Grills Entered By: Brenton Grills on 07/02/2022 14:34:09 -------------------------------------------------------------------------------- Pain Assessment Details Patient Name: Date of Service: [name], [name] 07/02/2022 2:00 PM Medical Record Number: [id number] Patient Account Number: [id number] Date of Birth/Sex: Treating RN: May 30, 1955 (68 y.o. Gevena Mart Primary Care Yolonda Purtle: Elesa Massed Other Clinician: Referring Sydell Prowell: Treating Lama Narayanan/Extender: Madaline Guthrie in Treatment: 2 Active Problems Location of Pain Severity and Description of Pain Patient  Has [facility] [address] [place], [place] ([id number]) [id number] Page 5 of 7 Pain Management and Medication Current Pain Management: Electronic Signature(s) Signed: 08/04/2022 7:49:05 AM By: Brenton Grills Entered By: Brenton Grills on 07/02/2022  14:16:10 -------------------------------------------------------------------------------- Patient/Caregiver Education Details Patient Name: Date of Service: [name] 5/9/2024andnbsp2:00 PM Medical Record Number: [id number] Patient Account Number: [id number] Date of Birth/Gender: Treating RN: Dec 18, 1955 (67 y.o. Gevena Mart Primary Care Physician: Elesa Massed Other Clinician: Referring Physician: Treating Physician/Extender: Madaline Guthrie in Treatment: 2 Education Assessment Education Provided To: Patient Education Topics Provided Wound/Skin Impairment: Methods: Explain/Verbal Responses: State content correctly Electronic Signature(s) Signed: 08/04/2022 7:49:05 AM By: Brenton Grills Entered By: Brenton Grills on 07/02/2022 14:34:40 -------------------------------------------------------------------------------- Wound Assessment Details Patient Name: Date of Service: [name], [name] 07/02/2022 2:00 PM Medical Record Number: [id number] Patient Account Number: [id number] Date of Birth/Sex: Treating RN: 27-Mar-1955 (67 y.o. Gevena Mart Primary Care Mercedes Valeriano: Elesa Massed Other Clinician: Referring Lennix Rotundo: Treating Yvonne Stopher/Extender: Madaline Guthrie in Treatment: 2 Wound Status Wound Number: 1 Primary Etiology: Venous Leg Ulcer Wound Location: Left, Medial Ankle Wound Status: Open Wounding Event: Gradually Appeared Comorbid History: Colitis, Rheumatoid Arthritis Date Acquired: 11/23/2021 Weeks Of Treatment: 2 Clustered Wound: No [name], [name] ([id number]) [id number].pdf Page 6 of 7 Photos Wound Measurements Length: (cm) 3.4 Width: (cm) 7 Depth: (cm) 0.1 Area: (cm) 18.692 Volume: (cm) 1.869 % Reduction in Area: -268.4% % Reduction in Volume: -84.1% Tunneling: No Undermining: No Wound Description Classification: Full Thickness Without Exposed Support Structures Wound Margin: Distinct,  outline attached Exudate Amount: Medium Exudate Type: Serosanguineous Exudate Color: red, brown Foul Odor After Cleansing: No Slough/Fibrino No Wound Bed Granulation Amount: Medium (34-66%) Exposed Structure Granulation Quality: Red, Pink Fat Layer (Subcutaneous Tissue) Exposed: Yes Necrotic Amount: Medium (34-66%) Necrotic Quality: Adherent Slough Periwound Skin Texture Texture Color No Abnormalities Noted: No No Abnormalities Noted: No Scarring: Yes Hemosiderin Staining: Yes Moisture Temperature / Pain No Abnormalities Noted: No Temperature: No Abnormality Dry / Scaly: No Maceration: No Electronic Signature(s) Signed: 08/04/2022 7:49:05 AM By: Brenton Grills Entered By: Brenton Grills on 07/02/2022 14:30:18 -------------------------------------------------------------------------------- Wound Assessment Details Patient Name: Date of Service: [name], [name] 07/02/2022 2:00 PM Medical Record Number: [id number] Patient Account Number: [id number] Date of Birth/Sex: Treating RN: [birth year] (67 y.o. Gevena Mart Primary Care Mairen Wallenstein: Elesa Massed Other Clinician: Referring Margo Lama: Treating Sihaam Chrobak/Extender: Madaline Guthrie in Treatment: 2 Wound Status Wound Number: 2 Primary Etiology: Venous Leg Ulcer Wound Location: Right, Medial Lower Leg Wound Status: Healed - Epithelialized Wounding Event: Blister Date Acquired: 06/25/2022 Weeks Of Treatment: 1 Clustered Wound: No Wound Measurements Length: (cm) 0 Width: (cm) 0 Depth: (cm) 0 Utter, Infiniti ([id number]) Area: (cm) 0 Volume: (cm) 0 % Reduction in Area: 100% % Reduction in Volume: 100% [id number] Page 7 of 7 Wound Description Classification: Full Thickness Without Exposed Suppor Exudate Amount: Medium Exudate Type: Serosanguineous Exudate Color: red, brown t Structures Periwound Skin Texture Texture Color No Abnormalities Noted: No No Abnormalities Noted:  No Moisture No Abnormalities Noted: No Treatment Notes Wound #2 (Lower Leg) Wound Laterality: Right, Medial Cleanser Peri-Wound Care Topical Primary Dressing Secondary Dressing Secured With Compression Wrap Compression Stockings Add-Ons Electronic Signature(s) Signed: 08/04/2022 7:49:05 AM By: Brenton Grills Entered By: Brenton Grills on 07/02/2022 14:28:14 -------------------------------------------------------------------------------- Vitals Details Patient Name: Date of Service: [name], [name] 07/02/2022 2:00 PM Medical Record Number: [id number] Patient Account Number: [id number] Date of Birth/Sex: Treating RN: 08-14-1955 (67 y.o. Gevena Mart Primary Care Arshia Spellman: Lorrene Reid,  Revonda Standard Other Clinician: Referring Beauford Lando: Treating Carmelite Violet/Extender: Madaline Guthrie in Treatment: 2 Vital Signs Time Taken: 14:15 Temperature (F): 97.6 Height (in): 67 Pulse (bpm): 93 Weight (lbs): 225 Respiratory Rate (breaths/min): 18 Body Mass Index (BMI): 35.2 Blood Pressure (mmHg): 146/88 Reference Range: 80 - 120 mg / dl Electronic Signature(s) Signed: 08/04/2022 7:49:05 AM By: Brenton Grills Entered By: Brenton Grills on 07/02/2022 14:16:03

## 2022-08-04 NOTE — Progress Notes (Signed)
Tara, [name] ([id number]) [id number] Page 1 of 8 Visit Report for 06/25/2022 Chief Complaint Document Details Patient Name: Date of Service: [name], [name] 06/25/2022 9:00 A M Medical Record Number: [id number] Patient Account Number: [id number] Date of Birth/Sex: Treating RN: 05-09-1955 (67 y.o. F) Primary Care Provider: Elesa Massed Other Clinician: Referring Provider: Treating Provider/Extender: Madaline Guthrie in Treatment: 1 Information Obtained from: Patient Chief Complaint Patient presents for treatment of an open ulcer due to venous insufficiency Electronic Signature(s) Signed: 06/25/2022 9:47:26 AM By: Duanne Guess MD FACS Entered By: Duanne Guess on 06/25/2022 09:47:26 -------------------------------------------------------------------------------- Debridement Details Patient Name: Date of Service: [name] 06/25/2022 9:00 A M Medical Record Number: [id number] Patient Account Number: [id number] Date of Birth/Sex: Treating RN: 1956/01/04 (67 y.o. Gevena [name] Primary Care Provider: Elesa Massed Other Clinician: Referring Provider: Treating Provider/Extender: Madaline Guthrie in Treatment: 1 Debridement Performed for Assessment: Wound #1 Left,Medial Ankle Performed By: Physician Duanne Guess, MD Debridement Type: Debridement Severity of Tissue Pre Debridement: Fat layer exposed Level of Consciousness (Pre-procedure): Awake and Alert Pre-procedure Verification/Time Out Yes - 09:38 Taken: Start Time: 09:39 Pain Control: Lidocaine 4% T opical Solution Percent of Wound Bed Debrided: 100% T Area Debrided (cm): otal 3.63 Tissue and other material debrided: Viable, Non-Viable, Slough, Slough Level: Non-Viable Tissue Debridement Description: Selective/Open Wound Instrument: Curette Bleeding: Minimum Hemostasis Achieved: Pressure End Time: 09:41 Procedural Pain: 3 Post Procedural Pain:  3 Response to Treatment: Procedure was tolerated well Level of Consciousness (Post- Awake and Alert procedure): Post Debridement Measurements of Total Wound Length: (cm) 3.3 Width: (cm) 1.4 Depth: (cm) 0.2 Volume: (cm) 0.726 Character of Wound/Ulcer Post Debridement: Improved Severity of Tissue Post Debridement: Fat layer exposed Post Procedure Diagnosis Same as Pre-procedure Notes Scribed for Dr Lady Gary by Brenton Grills RN. Tara, [name] ([id number]) [id number] Page 2 of 8 Electronic Signature(s) Signed: 06/25/2022 12:51:59 PM By: Duanne Guess MD FACS Signed: 08/04/2022 7:49:05 AM By: Brenton Grills Entered By: Brenton Grills on 06/25/2022 09:42:57 -------------------------------------------------------------------------------- Debridement Details Patient Name: Date of Service: [name] 06/25/2022 9:00 A M Medical Record Number: [id number] Patient Account Number: [id number] Date of Birth/Sex: Treating RN: 23-Aug-1955 (67 y.o. Gevena [name] Primary Care Provider: Elesa Massed Other Clinician: Referring Provider: Treating Provider/Extender: Madaline Guthrie in Treatment: 1 Debridement Performed for Assessment: Wound #2 Right,Medial Lower Leg Performed By: Physician Duanne Guess, MD Debridement Type: Debridement Severity of Tissue Pre Debridement: Fat layer exposed Level of Consciousness (Pre-procedure): Awake and Alert Pre-procedure Verification/Time Out Yes - 09:38 Taken: Start Time: 09:39 Pain Control: Lidocaine 4% Topical Solution Percent of Wound Bed Debrided: 100% T Area Debrided (cm): otal 2.36 Tissue and other material debrided: Non-Viable, Skin: Dermis Level: Skin/Dermis Debridement Description: Selective/Open Wound Instrument: Forceps, Scissors Bleeding: Minimum Hemostasis Achieved: Pressure End Time: 09:41 Procedural Pain: 3 Post Procedural Pain: 3 Response to Treatment: Procedure was tolerated  well Level of Consciousness (Post- Awake and Alert procedure): Post Debridement Measurements of Total Wound Length: (cm) 1.5 Width: (cm) 2 Depth: (cm) 0.1 Volume: (cm) 0.236 Character of Wound/Ulcer Post Debridement: Stable Severity of Tissue Post Debridement: Fat layer exposed Post Procedure Diagnosis Same as Pre-procedure Notes Scribed for Dr Lady Gary by Brenton Grills RN. Electronic Signature(s) Signed: 06/25/2022 12:51:59 PM By: Duanne Guess MD FACS Signed: 08/04/2022 7:49:05 AM By: Brenton Grills Entered By: Brenton Grills on 06/25/2022 09:44:32 -------------------------------------------------------------------------------- HPI Details Patient Name: Date of Service: [name], [name] 06/25/2022 9:00 A M Medical Record Number: [id number] Patient Account Number: [id number]  Date of Birth/Sex: Treating RN: 02/07/1956 (67 y.o. F) Primary Care Provider: Elesa Massed Other Clinician: Referring Provider: Treating Provider/Extender: Madaline Guthrie in Treatment: 1 History of Present Illness Tara, [name] ([id number]) [id number] Page 3 of 8 HPI Description: ADMISSION This is a 67 year old woman with a history of ulcerative colitis, not currently on any immunosuppressive agents, who first noticed some left ankle swelling and a small wound in October 2023. She was seen in the emergency department and given a course of oral antibiotics. The wound did not really improve. Her primary care doctor attempted to apply an Unna boot, but it sounds as though it was not quite properly applied and it was very painful and she developed significant swelling above the boot. Since that time, she has simply been applying Neosporin to the site. The wound has gotten larger and as result, she was referred to the wound care center for further evaluation and management. She has worn compression stockings in the past, but was not wearing them when she developed the  wound. ABI today was 1.02. She is not diabetic, nor does she smoke. 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 On her left medial ankle, there is an irregular wound consistent with a venous stasis ulcer. There is slough accumulation on the surface. The moisture balance is tending towards dry. She does have hemosiderin deposition on the bilateral ankles along with 1+ nonpitting edema. 06/25/2022: The ulcer on her left medial ankle is a bit cleaner but still has a fair amount of slough accumulation. There is good granulation tissue emerging. She developed an blister just anterior to this, however, and it has broken open, leaving her with a small superficial ulcer. Edema control is improved. She has not yet heard anything from the vascular clinic regarding her venous reflux studies. Electronic Signature(s) Signed: 06/25/2022 9:48:33 AM By: Duanne Guess MD FACS Entered By: Duanne Guess on 06/25/2022 09:48:33 -------------------------------------------------------------------------------- Physical Exam Details Patient Name: Date of Service: [name], [name] 06/25/2022 9:00 A M Medical Record Number: [id number] Patient Account Number: [id number] Date of Birth/Sex: Treating RN: Jul 23, 1955 (66 y.o. F) Primary Care Provider: Elesa Massed Other Clinician: Referring Provider: Treating Provider/Extender: Madaline Guthrie in Treatment: 1 Constitutional . . . . no acute distress. Respiratory Normal work of breathing on room air. Notes 06/25/2022: The ulcer on her left medial ankle is a bit cleaner but still has a fair amount of slough accumulation. There is good granulation tissue emerging. She developed an blister just anterior to this, however, and it has broken open, leaving her with a small superficial ulcer. Edema control is improved. Electronic Signature(s) Signed: 06/25/2022 9:49:00 AM By: Duanne Guess MD  FACS Entered By: Duanne Guess on 06/25/2022 09:49:00 -------------------------------------------------------------------------------- Physician Orders Details Patient Name: Date of Service: [name] 06/25/2022 9:00 A M Medical Record Number: [id number] Patient Account Number: [id number] Date of Birth/Sex: Treating RN: 02/04/1956 (67 y.o. Gevena [name] Primary Care Provider: Elesa Massed Other Clinician: Referring Provider: Treating Provider/Extender: Madaline Guthrie in Treatment: 1 Verbal / Phone Orders: No Diagnosis Coding ICD-10 Coding Code Description [phone] Non-pressure chronic ulcer of left ankle with fat layer exposed L97.321 Non-pressure chronic ulcer of left ankle limited to breakdown of skin I87.2 Venous insufficiency (chronic) (peripheral) K51.90 Ulcerative colitis, unspecified, without complications Follow-up Appointments ppointment in 1 week. - Dr, Lady Gary Rm 3 Return A Bathing/ Shower/ Hygiene May shower with  protection but do not get wound dressing(s) wet. Protect dressing(s) with water repellant cover (for example, large plastic bag) or a cast cover and may then take shower. ZANYIAH, CRUS ([id number]) [id number] Page 4 of 8 Edema Control - Lymphedema / SCD / Other Left Lower Extremity Elevate legs to the level of the heart or above for 30 minutes daily and/or when sitting for 3-4 times a day throughout the day. A void standing for long periods of time. Patient to wear own compression stockings every day. Exercise regularly If compression wraps slide down please call wound center and speak with a nurse. Additional Orders / Instructions Wound #1 Left,Medial Ankle Follow Nutritious Diet Wound Treatment Wound #1 - Ankle Wound Laterality: Left, Medial Cleanser: Soap and Water Discharge Instructions: May shower and wash wound with dial antibacterial soap and water prior to dressing change. Peri-Wound Care:  Sween Lotion (Moisturizing lotion) Discharge Instructions: Apply moisturizing lotion as directed Prim Dressing: IODOFLEX 0.9% Cadexomer Iodine Pad 4x6 cm ary Discharge Instructions: Apply to wound bed as instructed Secured With: Paper Tape, 2x10 (in/yd) Discharge Instructions: Secure dressing with tape as directed. Compression Wrap: Urgo K2 Lite, two layer compression system, regular Discharge Instructions: Apply Urgo K2 Lite as directed (alternative to 3 layer compression). Wound #2 - Lower Leg Wound Laterality: Right, Medial Cleanser: Vashe 5.8 (oz) Discharge Instructions: Cleanse the wound with Vashe prior to applying a clean dressing using gauze sponges, not tissue or cotton balls. Peri-Wound Care: Sween Lotion (Moisturizing lotion) Discharge Instructions: Apply moisturizing lotion as directed Prim Dressing: Maxorb Extra CMC/Alginate Dressing, 4x4 (in/in) ary Discharge Instructions: Apply to wound bed as instructed Secondary Dressing: Zetuvit Plus Silicone Border Dressing 5x5 (in/in) Discharge Instructions: Apply silicone border over primary dressing as directed. Compression Wrap: Urgo K2, two layer compression system, regular Discharge Instructions: Apply Urgo K2 as directed (alternative to 4 layer compression). Electronic Signature(s) Signed: 06/25/2022 12:51:59 PM By: Duanne Guess MD FACS Entered By: Duanne Guess on 06/25/2022 09:49:37 -------------------------------------------------------------------------------- Problem List Details Patient Name: Date of Service: [name] 06/25/2022 9:00 A M Medical Record Number: [id number] Patient Account Number: [id number] Date of Birth/Sex: Treating RN: 1955/07/01 (67 y.o. F) Primary Care Provider: Elesa Massed Other Clinician: Referring Provider: Treating Provider/Extender: Madaline Guthrie in Treatment: 1 Active Problems ICD-10 Encounter Code Description Active Date MDM Diagnosis L97.322  Non-pressure chronic ulcer of left ankle with fat layer exposed 06/18/2022 No Yes L97.321 Non-pressure chronic ulcer of left ankle limited to breakdown of skin 06/25/2022 No Yes [name], [name] ([id number]) [phone].pdf Page 5 of 8 I87.2 Venous insufficiency (chronic) (peripheral) 06/18/2022 No Yes K51.90 Ulcerative colitis, unspecified, without complications 06/18/2022 No Yes Inactive Problems Resolved Problems Electronic Signature(s) Signed: 06/25/2022 9:47:03 AM By: Duanne Guess MD FACS Entered By: Duanne Guess on 06/25/2022 09:47:03 -------------------------------------------------------------------------------- Progress Note Details Patient Name: Date of Service: [name] 06/25/2022 9:00 A M Medical Record Number: [id number] Patient Account Number: [id number] Date of Birth/Sex: Treating RN: 09/13/1955 (67 y.o. F) Primary Care Provider: Elesa Massed Other Clinician: Referring Provider: Treating Provider/Extender: Madaline Guthrie in Treatment: 1 Subjective Chief Complaint Information obtained from Patient Patient presents for treatment of an open ulcer due to venous insufficiency History of Present Illness (HPI) ADMISSION This is a 67 year old woman with a history of ulcerative colitis, not currently on any immunosuppressive agents, who first noticed some left ankle swelling and a small wound in October 2023. She was seen in the emergency department and given a course of oral antibiotics. The wound  did not really improve. Her primary care doctor attempted to apply an Unna boot, but it sounds as though it was not quite properly applied and it was very painful and she developed significant swelling above the boot. Since that time, she has simply been applying Neosporin to the site. The wound has gotten larger and as result, she was referred to the wound care center for further evaluation and management. She has worn compression  stockings in the past, but was not wearing them when she developed the wound. ABI today was 1.02. She is not diabetic, nor does she smoke. 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 On her left medial ankle, there is an irregular wound consistent with a venous stasis ulcer. There is slough accumulation on the surface. The moisture balance is tending towards dry. She does have hemosiderin deposition on the bilateral ankles along with 1+ nonpitting edema. 06/25/2022: The ulcer on her left medial ankle is a bit cleaner but still has a fair amount of slough accumulation. There is good granulation tissue emerging. She developed an blister just anterior to this, however, and it has broken open, leaving her with a small superficial ulcer. Edema control is improved. She has not yet heard anything from the vascular clinic regarding her venous reflux studies. Patient History Information obtained from Patient. Family History Diabetes - Siblings, Heart Disease - Father, Hypertension - Mother, Stroke - Mother, No family history of Cancer, Hereditary Spherocytosis, Kidney Disease, Lung Disease, Seizures, Thyroid Problems, Tuberculosis. Social History Never smoker, Marital Status - Widowed, Alcohol Use - Rarely, Drug Use - No History, Caffeine Use - Daily. Medical History Gastrointestinal Patient has history of Colitis Musculoskeletal Patient has history of Rheumatoid Arthritis Objective NATASSHA, COKER ([id number]) [id number] Page 6 of 8 Constitutional no acute distress. Vitals Time Taken: 9:15 AM, Height: 67 in, Weight: 225 lbs, BMI: 35.2, Temperature: 97.7 F, Pulse: 85 bpm, Respiratory Rate: 18 breaths/min, Blood Pressure: 139/86 mmHg. Respiratory Normal work of breathing on room air. General Notes: 06/25/2022: The ulcer on her left medial ankle is a bit cleaner but still has a fair amount of slough accumulation. There  is good granulation tissue emerging. She developed an blister just anterior to this, however, and it has broken open, leaving her with a small superficial ulcer. Edema control is improved. Integumentary (Hair, Skin) Wound #1 status is Open. Original cause of wound was Gradually Appeared. The date acquired was: 11/23/2021. The wound has been in treatment 1 weeks. The wound is located on the Left,Medial Ankle. The wound measures 3.3cm length x 1.4cm width x 0.2cm depth; 3.629cm^2 area and 0.726cm^3 volume. There is Fat Layer (Subcutaneous Tissue) exposed. There is no tunneling or undermining noted. There is a medium amount of serosanguineous drainage noted. The wound margin is distinct with the outline attached to the wound base. There is medium (34-66%) red, pink granulation within the wound bed. There is a medium (34-66%) amount of necrotic tissue within the wound bed including Adherent Slough. The periwound skin appearance exhibited: Scarring, Hemosiderin Staining. The periwound skin appearance did not exhibit: Dry/Scaly, Maceration. Periwound temperature was noted as No Abnormality. Wound #2 status is Open. Original cause of wound was Blister. The date acquired was: 06/25/2022. The wound is located on the Right,Medial Lower Leg. The wound measures 1.5cm length x 2cm width x 0.1cm depth; 2.356cm^2 area and 0.236cm^3 volume. There is Fat Layer (Subcutaneous Tissue) exposed. There is a medium  amount of serosanguineous drainage noted. There is large (67-100%) red, pink granulation within the wound bed. There is a small (1-33%) amount of necrotic tissue within the wound bed including Adherent Slough. The periwound skin appearance exhibited: Scarring, Hemosiderin Staining. The periwound skin appearance did not exhibit: Dry/Scaly, Maceration. Periwound temperature was noted as No Abnormality. 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 There was a Selective/Open Wound Non-Viable Tissue Debridement with a total area of 3.63 sq cm performed by Duanne Guess, MD. With the following instrument(s): Curette to remove Viable and Non-Viable tissue/material. Material removed includes [facility] after achieving pain control using Lidocaine 4% Topical Solution. A time out was conducted at 09:38, prior to the start of the procedure. A Minimum amount of bleeding was controlled with Pressure. The procedure was tolerated well with a pain level of 3 throughout and a pain level of 3 following the procedure. Post Debridement Measurements: 3.3cm length x 1.4cm width x 0.2cm depth; 0.726cm^3 volume. Character of Wound/Ulcer Post Debridement is improved. Severity of Tissue Post Debridement is: Fat layer exposed. Post procedure Diagnosis Wound #1: Same as Pre-Procedure General Notes: Scribed for Dr Lady Gary by Brenton Grills RN.Marland Kitchen Wound #2 Pre-procedure diagnosis of Wound #2 is a Venous Leg Ulcer located on the Right,Medial Lower Leg .Severity of Tissue Pre Debridement is: Fat layer exposed. There was a Selective/Open Wound Skin/Dermis Debridement with a total area of 2.36 sq cm performed by Duanne Guess, MD. With the following instrument(s): Forceps, and Scissors to remove Non-Viable tissue/material. Material removed includes Skin: Dermis after achieving pain control using Lidocaine 4% T opical Solution. No specimens were taken. A time out was conducted at 09:38, prior to the start of the procedure. A Minimum amount of bleeding was controlled with Pressure. The procedure was tolerated well with a pain level of 3 throughout and a  pain level of 3 following the procedure. Post Debridement Measurements: 1.5cm length x 2cm width x 0.1cm depth; 0.236cm^3 volume. Character of Wound/Ulcer Post Debridement is stable. Severity of Tissue Post Debridement is: Fat layer exposed. Post procedure Diagnosis Wound #2: Same as Pre-Procedure General Notes: Scribed for Dr Lady Gary by Brenton Grills RN.Marland Kitchen Plan Follow-up Appointments: Return Appointment in 1 week. - Dr, Lady Gary Rm 3 Bathing/ Shower/ Hygiene: May shower with protection but do not get wound dressing(s) wet. Protect dressing(s) with water repellant cover (for example, large plastic bag) or a cast cover and may then take shower. Edema Control - Lymphedema / SCD / Other: Elevate legs to the level of the heart or above for 30 minutes daily and/or when sitting for 3-4 times a day throughout the day. Avoid standing for long periods of time. Patient to wear own compression stockings every day. Exercise regularly If compression wraps slide down please call wound center and speak with a nurse. [name], [name] ([id number]) [id number] Page 7 of 8 Additional Orders / Instructions: Wound #1 Left,Medial Ankle: Follow Nutritious Diet WOUND #1: - Ankle Wound Laterality: Left, Medial Cleanser: Soap and Water Discharge Instructions: May shower and wash wound with dial antibacterial soap and water prior to dressing change. Peri-Wound Care: Sween Lotion (Moisturizing lotion) Discharge Instructions: Apply moisturizing lotion as directed Prim Dressing: IODOFLEX 0.9% Cadexomer  Iodine Pad 4x6 cm ary Discharge Instructions: Apply to wound bed as instructed Secured With: Paper T ape, 2x10 (in/yd) Discharge Instructions: Secure dressing with tape as directed. Com pression Wrap: Urgo K2 Lite, two layer compression system, regular Discharge Instructions: Apply Urgo K2 Lite as directed (alternative to 3 layer compression). WOUND #2: - Lower Leg Wound Laterality: Right,  Medial Cleanser: Vashe 5.8 (oz) Discharge Instructions: Cleanse the wound with Vashe prior to applying a clean dressing using gauze sponges, not tissue or cotton balls. Peri-Wound Care: Sween Lotion (Moisturizing lotion) Discharge Instructions: Apply moisturizing lotion as directed Prim Dressing: Maxorb Extra CMC/Alginate Dressing, 4x4 (in/in) ary Discharge Instructions: Apply to wound bed as instructed Secondary Dressing: Zetuvit Plus Silicone Border Dressing 5x5 (in/in) Discharge Instructions: Apply silicone border over primary dressing as directed. Com pression Wrap: Urgo K2, two layer compression system, regular Discharge Instructions: Apply Urgo K2 as directed (alternative to 4 layer compression). 06/25/2022: The ulcer on her left medial ankle is a bit cleaner but still has a fair amount of slough accumulation. There is good granulation tissue emerging. She developed an blister just anterior to this, however, and it has broken open, leaving her with a small superficial ulcer. Edema control is improved. I used a curette to debride the slough from the medial ankle wound and scissors and forceps to debride the skin from the new blister. We will apply silver alginate to the blister site and use Iodoflex for another week on the existing ulcer. Continue 3 layer compression equivalent. Follow-up in 1 week. Electronic Signature(s) Signed: 06/25/2022 9:53:25 AM By: Duanne Guess MD FACS Entered By: Duanne Guess on 06/25/2022 09:53:25 -------------------------------------------------------------------------------- HxROS Details Patient Name: Date of Service: [name] 06/25/2022 9:00 A M Medical Record Number: [id number] Patient Account Number: [id number] Date of Birth/Sex: Treating RN: 23-May-1955 (67 y.o. F) Primary Care Provider: Elesa Massed Other Clinician: Referring Provider: Treating Provider/Extender: Madaline Guthrie in Treatment: 1 Information Obtained  From Patient Gastrointestinal Medical History: Positive for: Colitis Musculoskeletal Medical History: Positive for: Rheumatoid Arthritis Immunizations Pneumococcal Vaccine: Received Pneumococcal Vaccination: Yes Received Pneumococcal Vaccination On or After 60th Birthday: No Tetanus Vaccine: Last tetanus shot: 05/25/2015 Implantable Devices None Family and Social History Cancer: No; Diabetes: Yes - Siblings; Heart Disease: Yes - Father; Hereditary Spherocytosis: No; Hypertension: Yes - Mother; Kidney Disease: No; Lung Disease: No; Seizures: No; Stroke: Yes - Mother; Thyroid Problems: No; Tuberculosis: No; Never smoker; Marital Status - Widowed; Alcohol Use: Rarely; Drug Use: No History; Caffeine Use: Daily; Financial Concerns: No; Food, Clothing or Shelter Needs: No; Support System Lacking: No; Transportation [name], [name] ([id number]) [id number] Page 8 of 8 Concerns: No Electronic Signature(s) Signed: 06/25/2022 12:51:59 PM By: Duanne Guess MD FACS Entered By: Duanne Guess on 06/25/2022 09:48:40 -------------------------------------------------------------------------------- SuperBill Details Patient Name: Date of Service: [name] 06/25/2022 Medical Record Number: [id number] Patient Account Number: [id number] Date of Birth/Sex: Treating RN: Mar 09, 1955 (67 y.o. 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 I87.2 Venous insufficiency (chronic) (peripheral) K51.90 Ulcerative colitis, unspecified, without complications Facility Procedures : CPT4 Code: [id number] Description: 97597 - DEBRIDE WOUND 1ST 20 SQ CM OR < ICD-10 Diagnosis  Description L97.322 Non-pressure chronic ulcer of left ankle with fat layer exposed L97.321 Non-pressure chronic ulcer of left ankle limited  to breakdown of skin Modifier: Quantity: 1 Physician Procedures : CPT4 Code Description Modifier [id number] - WC PHYS LEVEL 4 - EST PT 25 ICD-10 Diagnosis Description L97.322 Non-pressure chronic ulcer of left ankle with fat layer exposed L97.321 Non-pressure chronic ulcer of left ankle limited to breakdown of  skin I87.2 Venous insufficiency (chronic) (peripheral) Quantity: 1 : [id number] 97597 - WC PHYS DEBR WO ANESTH 20 SQ CM ICD-10 Diagnosis Description 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

## 2022-08-04 NOTE — Progress Notes (Signed)
Tara, [name] ([id number]) [id number] Page 1 of 7 Visit Report for 06/25/2022 Arrival Information Details Patient Name: Date of Service: [name], [name] 06/25/2022 9:00 A M Medical Record Number: [id number] Patient Account Number: [id number] Date of Birth/Sex: Treating RN: 08/26/1955 (67 y.o. Gevena Mart Primary Care Joshlyn Beadle: Elesa Massed Other Clinician: Referring Lakea Mittelman: Treating Tonjua Rossetti/Extender: Madaline Guthrie in Treatment: 1 Visit Information History Since Last Visit All ordered tests and consults were completed: Yes Patient Arrived: Ambulatory Added or deleted any medications: No Arrival Time: 09:05 Any new allergies or adverse reactions: No Accompanied By: self Had a fall or experienced change in No Transfer Assistance: None activities of daily living that may affect Patient Identification Verified: Yes risk of falls: Secondary Verification Process Completed: Yes Signs or symptoms of abuse/neglect since last visito No Patient Requires Transmission-Based Precautions: No Hospitalized since last visit: No Patient Has Alerts: No Implantable device outside of the clinic excluding No cellular tissue based products placed in the center since last visit: Has Dressing in Place as Prescribed: Yes Pain Present Now: No Electronic Signature(s) Signed: 08/04/2022 7:49:05 AM By: Brenton Grills Entered By: Brenton Grills on 06/25/2022 09:14:26 -------------------------------------------------------------------------------- Compression Therapy Details Patient Name: Date of Service: [name] 06/25/2022 9:00 A M Medical Record Number: [id number] Patient Account Number: [id number] Date of Birth/Sex: Treating RN: February 26, 1955 (67 y.o. Gevena Mart Primary Care Shrey Boike: Elesa Massed Other Clinician: Referring Dalma Panchal: Treating Jemima Petko/Extender: Madaline Guthrie in Treatment: 1 Compression Therapy  Performed for Wound Assessment: Wound #1 Left,Medial Ankle Performed By: Leighton Parody, RN Compression Type: Three Layer Post Procedure Diagnosis Same as Pre-procedure Electronic Signature(s) Signed: 08/04/2022 7:49:05 AM By: Brenton Grills Entered By: Brenton Grills on 06/25/2022 10:02:05 -------------------------------------------------------------------------------- Compression Therapy Details Patient Name: Date of Service: [name] 06/25/2022 9:00 A M Medical Record Number: [id number] Patient Account Number: [id number] Date of Birth/Sex: Treating RN: 08-Jan-1956 (67 y.o. Gevena Mart Primary Care Emalia Witkop: Elesa Massed Other Clinician: Referring Elesia Pemberton: Treating Maedell Hedger/Extender: Madaline Guthrie in Treatment: 1 Compression Therapy Performed for Wound Assessment: Wound #2 Right,Medial Lower Leg Performed By: Clinician Brenton Grills, RN Compression Type: Three Layer Post Procedure Diagnosis Same as Rodman Pickle, [name] ([id number]) [id number] Page 2 of 7 Electronic Signature(s) Signed: 08/04/2022 7:49:05 AM By: Brenton Grills Entered By: Brenton Grills on 06/25/2022 10:02:05 -------------------------------------------------------------------------------- Encounter Discharge Information Details Patient Name: Date of Service: [name] 06/25/2022 9:00 A M Medical Record Number: [id number] Patient Account Number: [id number] Date of Birth/Sex: Treating RN: [birth year] (67 y.o. Gevena Mart Primary Care Geraldine Sandberg: Elesa Massed Other Clinician: Referring Neyland Pettengill: Treating Quinnley Colasurdo/Extender: Madaline Guthrie in Treatment: 1 Encounter Discharge Information Items Post Procedure Vitals Discharge Condition: Stable Temperature (F): 98.1 Ambulatory Status: Ambulatory Pulse (bpm): 78 Discharge Destination: Home Respiratory Rate (breaths/min): 18 Transportation: Private Auto Blood  Pressure (mmHg): 138/72 Accompanied By: self Schedule Follow-up Appointment: Yes Clinical Summary of Care: Patient Declined Electronic Signature(s) Signed: 08/04/2022 7:49:05 AM By: Brenton Grills Entered By: Brenton Grills on 06/25/2022 10:05:07 -------------------------------------------------------------------------------- Lower Extremity Assessment Details Patient Name: Date of Service: Tara, [name] 06/25/2022 9:00 A M Medical Record Number: [id number] Patient Account Number: [id number] Date of Birth/Sex: Treating RN: [birth year] (67 y.o. Gevena Mart Primary Care Lorane Cousar: Elesa Massed Other Clinician: Referring Mahlia Fernando: Treating Alayjah Boehringer/Extender: Madaline Guthrie in Treatment: 1 Edema Assessment Assessed: [Left: No] [Right: No] [Left: Edema] [Right: :] Calf Left: Right: Point of Measurement: From Medial Instep 36 cm Ankle Left: Right:  Point of Measurement: From Medial Instep 26 cm Vascular Assessment Pulses: Dorsalis Pedis Palpable: [Left:Yes] Electronic Signature(s) Signed: 08/04/2022 7:49:05 AM By: Brenton Grills Entered By: Brenton Grills on 06/25/2022 09:20:25 Multi Wound Chart Details -------------------------------------------------------------------------------- Carney Living ([id number]) [id number] Page 3 of 7 Patient Name: Date of Service: Tara, [name] 06/25/2022 9:00 A M Medical Record Number: [id number] Patient Account Number: [id number] Date of Birth/Sex: Treating RN: March 08, 1955 (67 y.o. F) Primary Care Vani Gunner: Elesa Massed Other Clinician: Referring Breniyah Romm: Treating Dimitria Ketchum/Extender: Madaline Guthrie in Treatment: 1 Vital Signs Height(in): 67 Pulse(bpm): 85 Weight(lbs): 225 Blood Pressure(mmHg): 139/86 Body Mass Index(BMI): 35.2 Temperature(F): 97.7 Respiratory Rate(breaths/min): 18 Wound Assessments Wound Number: 1 2 N/A Photos: N/A Left, Medial Ankle Right, Medial  Lower Leg N/A Wound Location: Gradually Appeared Blister N/A Wounding Event: Venous Leg Ulcer Venous Leg Ulcer N/A Primary Etiology: Colitis, Rheumatoid Arthritis Colitis, Rheumatoid Arthritis N/A Comorbid History: 11/23/2021 06/25/2022 N/A Date Acquired: 1 0 N/A Weeks of Treatment: Open Open N/A Wound Status: No No N/A Wound Recurrence: 3.3x1.4x0.2 1.5x2x0.1 N/A Measurements L x W x D (cm) 3.629 2.356 N/A A (cm) : rea 0.726 0.236 N/A Volume (cm) : 28.50% N/A N/A % Reduction in A rea: 28.50% N/A N/A % Reduction in Volume: Full Thickness Without Exposed Full Thickness Without Exposed N/A Classification: Support Structures Support Structures Medium Medium N/A Exudate Amount: Serosanguineous Serosanguineous N/A Exudate Type: red, brown red, brown N/A Exudate Color: Distinct, outline attached N/A N/A Wound Margin: Medium (34-66%) Large (67-100%) N/A Granulation Amount: Red, Pink Red, Pink N/A Granulation Quality: Medium (34-66%) Small (1-33%) N/A Necrotic Amount: Fat Layer (Subcutaneous Tissue): Yes Fat Layer (Subcutaneous Tissue): Yes N/A Exposed Structures: Fascia: No Tendon: No Muscle: No Joint: No Bone: No Debridement - Selective/Open Wound Debridement - Selective/Open Wound N/A Debridement: Pre-procedure Verification/Time Out 09:38 09:38 N/A Taken: Lidocaine 4% Topical Solution Lidocaine 4% Topical Solution N/A Pain Control: Slough N/A N/A Tissue Debrided: Non-Viable Tissue Skin/Dermis N/A Level: 3.63 2.36 N/A Debridement A (sq cm): rea Curette Forceps, Scissors N/A Instrument: Minimum Minimum N/A Bleeding: Pressure Pressure N/A Hemostasis A chieved: 3 3 N/A Procedural Pain: 3 3 N/A Post Procedural Pain: Procedure was tolerated well Procedure was tolerated well N/A Debridement Treatment Response: 3.3x1.4x0.2 1.5x2x0.1 N/A Post Debridement Measurements L x W x D (cm) 0.726 0.236 N/A Post Debridement Volume: (cm) Scarring: Yes Scarring:  Yes N/A Periwound Skin Texture: Maceration: No Maceration: No N/A Periwound Skin Moisture: Dry/Scaly: No Dry/Scaly: No Hemosiderin Staining: Yes Hemosiderin Staining: Yes N/A Periwound Skin Color: No Abnormality No Abnormality N/A Temperature: Debridement Debridement N/A Procedures Performed: Treatment Notes Electronic Signature(sLATA, MEHRING ([id number]) [id number] Page 4 of 7 Signed: 06/25/2022 9:47:11 AM By: Duanne Guess MD FACS Entered By: Duanne Guess on 06/25/2022 09:47:10 -------------------------------------------------------------------------------- Multi-Disciplinary Care Plan Details Patient Name: Date of Service: [name] 06/25/2022 9:00 A M Medical Record Number: [id number] Patient Account Number: [id number] Date of Birth/Sex: Treating RN: [birth year] (67 y.o. Gevena Mart Primary Care Anahid Eskelson: Elesa Massed Other Clinician: Referring Araina Butrick: Treating Jeyda Siebel/Extender: Madaline Guthrie in Treatment: 1 Active Inactive Wound/Skin Impairment Nursing Diagnoses: Impaired tissue integrity Goals: Patient/caregiver will verbalize understanding of skin care regimen Date Initiated: 06/18/2022 Target Resolution Date: 07/24/2022 Goal Status: Active Interventions: Assess patient/caregiver ability to obtain necessary supplies Assess patient/caregiver ability to perform ulcer/skin care regimen upon admission and as needed Assess ulceration(s) every visit Provide education on ulcer and skin care Treatment Activities: Skin care regimen initiated : 06/18/2022 Topical wound management initiated : 06/18/2022 Notes:  Electronic Signature(s) Signed: 08/04/2022 7:49:05 AM By: Brenton Grills Entered By: Brenton Grills on 06/25/2022 09:28:21 -------------------------------------------------------------------------------- Pain Assessment Details Patient Name: Date of Service: [name] 06/25/2022 9:00 A M Medical  Record Number: [id number] Patient Account Number: [id number] Date of Birth/Sex: Treating RN: 1955/11/13 (67 y.o. Gevena Mart Primary Care Kattia Selley: Elesa Massed Other Clinician: Referring Tevis Conger: Treating Ryliegh Mcduffey/Extender: Madaline Guthrie in Treatment: 1 Active Problems Location of Pain Severity and Description of Pain Patient Has Paino No Site Locations [place], [place] ([id number]) [id number] Page 5 of 7 Pain Management and Medication Current Pain Management: Electronic Signature(s) Signed: 08/04/2022 7:49:05 AM By: Brenton Grills Entered By: Brenton Grills on 06/25/2022 09:19:09 -------------------------------------------------------------------------------- Patient/Caregiver Education Details Patient Name: Date of Service: [name] 5/2/2024andnbsp9:00 A M Medical Record Number: [id number] Patient Account Number: [id number] Date of Birth/Gender: Treating RN: May 28, 1955 (67 y.o. Gevena Mart Primary Care Physician: Elesa Massed Other Clinician: Referring Physician: Treating Physician/Extender: Madaline Guthrie in Treatment: 1 Education Assessment Education Provided To: Patient Education Topics Provided Wound/Skin Impairment: Methods: Explain/Verbal Responses: State content correctly Electronic Signature(s) Signed: 08/04/2022 7:49:05 AM By: Brenton Grills Entered By: Brenton Grills on 06/25/2022 09:28:41 -------------------------------------------------------------------------------- Wound Assessment Details Patient Name: Date of Service: [name] 06/25/2022 9:00 A M Medical Record Number: [id number] Patient Account Number: [id number] Date of Birth/Sex: Treating RN: Jan 14, 1956 (67 y.o. Gevena Mart Primary Care Jalilah Wiltsie: Elesa Massed Other Clinician: Referring Zurii Hewes: Treating Seynabou Fults/Extender: Madaline Guthrie in Treatment: 1 Wound Status Wound  Number: 1 Primary Etiology: Venous Leg Ulcer Wound Location: Left, Medial Ankle Wound Status: Open Wounding Event: Gradually Appeared Comorbid History: Colitis, Rheumatoid Arthritis Date Acquired: 11/23/2021 Weeks Of Treatment: 1 Clustered Wound: No THEOLA, GACHUPIN ([id number]) [phone] Page 6 of 7 Photos Wound Measurements Length: (cm) 3.3 Width: (cm) 1.4 Depth: (cm) 0.2 Area: (cm) 3.629 Volume: (cm) 0.726 % Reduction in Area: 28.5% % Reduction in Volume: 28.5% Tunneling: No Undermining: No Wound Description Classification: Full Thickness Without Exposed Support Structures Wound Margin: Distinct, outline attached Exudate Amount: Medium Exudate Type: Serosanguineous Exudate Color: red, brown Foul Odor After Cleansing: No Slough/Fibrino No Wound Bed Granulation Amount: Medium (34-66%) Exposed Structure Granulation Quality: Red, Pink Fat Layer (Subcutaneous Tissue) Exposed: Yes Necrotic Amount: Medium (34-66%) Necrotic Quality: Adherent Slough Periwound Skin Texture Texture Color No Abnormalities Noted: No No Abnormalities Noted: No Scarring: Yes Hemosiderin Staining: Yes Moisture Temperature / Pain No Abnormalities Noted: No Temperature: No Abnormality Dry / Scaly: No Maceration: No Electronic Signature(s) Signed: 08/04/2022 7:49:05 AM By: Brenton Grills Entered By: Brenton Grills on 06/25/2022 09:27:05 -------------------------------------------------------------------------------- Wound Assessment Details Patient Name: Date of Service: [name] 06/25/2022 9:00 A M Medical Record Number: [id number] Patient Account Number: [id number] Date of Birth/Sex: Treating RN: Mar 18, 1955 (67 y.o. Gevena Mart Primary Care Mardy Lucier: Elesa Massed Other Clinician: Referring Oris Staffieri: Treating Rayelynn Loyal/Extender: Madaline Guthrie in Treatment: 1 Wound Status Wound Number: 2 Primary Etiology: Venous Leg Ulcer Wound  Location: Right, Medial Lower Leg Wound Status: Open Wounding Event: Blister Comorbid History: Colitis, Rheumatoid Arthritis Date Acquired: 06/25/2022 Weeks Of Treatment: 0 Clustered Wound: No Photos LEVONA, KURDZIEL ([id number]) [id number] Page 7 of 7 Wound Measurements Length: (cm) Width: (cm) Depth: (cm) Area: (cm) Volume: (cm) 1.5 % Reduction in Area: 2 % Reduction in Volume: 0.1 2.356 0.236 Wound Description Classification: Full Thickness Without Exposed Support Structures Exudate Amount: Medium Exudate Type: Serosanguineous Exudate Color: red, brown Foul Odor After Cleansing: No Slough/Fibrino No Wound Bed Granulation Amount:  Large (67-100%) Exposed Structure Granulation Quality: Red, Pink Fascia Exposed: No Necrotic Amount: Small (1-33%) Fat Layer (Subcutaneous Tissue) Exposed: Yes Necrotic Quality: Adherent Slough Tendon Exposed: No Muscle Exposed: No Joint Exposed: No Bone Exposed: No Periwound Skin Texture Texture Color No Abnormalities Noted: No No Abnormalities Noted: No Scarring: Yes Hemosiderin Staining: Yes Moisture Temperature / Pain No Abnormalities Noted: No Temperature: No Abnormality Dry / Scaly: No Maceration: No Electronic Signature(s) Signed: 08/04/2022 7:49:05 AM By: Brenton Grills Entered By: Brenton Grills on 06/25/2022 09:27:32 -------------------------------------------------------------------------------- Vitals Details Patient Name: Date of Service: [name], Jannett 06/25/2022 9:00 A M Medical Record Number: [id number] Patient Account Number: [id number] Date of Birth/Sex: Treating RN: Mar 23, 1955 (67 y.o. Gevena Mart Primary Care Lacoya Wilbanks: Elesa Massed Other Clinician: Referring Dakari Cregger: Treating Marlisha Vanwyk/Extender: Madaline Guthrie in Treatment: 1 Vital Signs Time Taken: 09:15 Temperature (F): 97.7 Height (in): 67 Pulse (bpm): 85 Weight (lbs): 225 Respiratory Rate  (breaths/min): 18 Body Mass Index (BMI): 35.2 Blood Pressure (mmHg): 139/86 Reference Range: 80 - 120 mg / dl Electronic Signature(s) Signed: 08/04/2022 7:49:05 AM By: Brenton Grills Entered By: Brenton Grills on 06/25/2022 09:17:34

## 2022-08-04 NOTE — Progress Notes (Signed)
Tara, [name] ([id number]) [id number] Page 1 of 7 Visit Report for 07/02/2022 Chief Complaint Document Details Patient Name: Date of Service: [name], [name] 07/02/2022 2:00 PM Medical Record Number: [id number] Patient Account Number: [id number] Date of Birth/Sex: Treating RN: 08-Aug-1955 (67 y.o. F) Primary Care Provider: Elesa Massed Other Clinician: Referring Provider: Treating Provider/Extender: Madaline Guthrie in Treatment: 2 Information Obtained from: Patient Chief Complaint Patient presents for treatment of an open ulcer due to venous insufficiency Electronic Signature(s) Signed: 07/02/2022 3:27:24 PM By: Duanne Guess MD FACS Entered By: Duanne Guess on 07/02/2022 15:27:24 -------------------------------------------------------------------------------- Debridement Details Patient Name: Date of Service: [name] 07/02/2022 2:00 PM Medical Record Number: [id number] Patient Account Number: [id number] Date of Birth/Sex: Treating RN: Nov 26, 1955 (67 y.o. Gevena Mart Primary Care Provider: Elesa Massed Other Clinician: Referring Provider: Treating Provider/Extender: Madaline Guthrie in Treatment: 2 Debridement Performed for Assessment: Wound #1 Left,Medial Ankle Performed By: Physician Duanne Guess, MD Debridement Type: Debridement Severity of Tissue Pre Debridement: Fat layer exposed Level of Consciousness (Pre-procedure): Awake and Alert Pre-procedure Verification/Time Out Yes - 14:30 Taken: Start Time: 14:35 Pain Control: Lidocaine 4% T opical Solution Percent of Wound Bed Debrided: 100% T Area Debrided (cm): otal 1.87 Tissue and other material debrided: Viable, Non-Viable, Slough, Subcutaneous, Slough Level: Skin/Subcutaneous Tissue Debridement Description: Excisional Instrument: Curette Bleeding: Minimum Hemostasis Achieved: Pressure End Time: 14:37 Procedural Pain: 0 Post  Procedural Pain: 0 Response to Treatment: Procedure was tolerated well Level of Consciousness (Post- Awake and Alert procedure): Post Debridement Measurements of Total Wound Length: (cm) 3.4 Width: (cm) 0.7 Depth: (cm) 0.1 Volume: (cm) 0.187 Character of Wound/Ulcer Post Debridement: Stable Severity of Tissue Post Debridement: Fat layer exposed Post Procedure Diagnosis Same as Pre-procedure Notes scribed for Dr. Lady Gary by Brenton Grills, RN Paton, [name] ([id number]) [phone].pdf Page 2 of 7 Electronic Signature(s) Signed: 07/02/2022 4:15:25 PM By: Duanne Guess MD FACS Signed: 08/04/2022 7:49:05 AM By: Brenton Grills Entered By: Brenton Grills on 07/02/2022 14:59:01 -------------------------------------------------------------------------------- HPI Details Patient Name: Date of Service: [name] 07/02/2022 2:00 PM Medical Record Number: [id number] Patient Account Number: [id number] Date of Birth/Sex: Treating RN: 05/25/1955 (67 y.o. F) Primary Care Provider: Elesa Massed Other Clinician: Referring Provider: Treating Provider/Extender: Madaline Guthrie in Treatment: 2 History of Present Illness HPI Description: ADMISSION This is a 67 year old woman with a history of ulcerative colitis, not currently on any immunosuppressive agents, who first noticed some left ankle swelling and a small wound in October 2023. She was seen in the emergency department and given a course of oral antibiotics. The wound did not really improve. Her primary care doctor attempted to apply an Unna boot, but it sounds as though it was not quite properly applied and it was very painful and she developed significant swelling above the boot. Since that time, she has simply been applying Neosporin to the site. The wound has gotten larger and as result, she was referred to the wound care center for further evaluation and management. She has worn compression  stockings in the past, but was not wearing them when she developed the wound. ABI today was 1.02. She is not diabetic, nor does she smoke. 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 On her left medial ankle, there is an irregular wound consistent with a venous stasis ulcer. There is slough accumulation on the surface. The moisture balance is tending towards dry. She does  have hemosiderin deposition on the bilateral ankles along with 1+ nonpitting edema. 06/25/2022: The ulcer on her left medial ankle is a bit cleaner but still has a fair amount of slough accumulation. There is good granulation tissue emerging. She developed an blister just anterior to this, however, and it has broken open, leaving her with a small superficial ulcer. Edema control is improved. She has not yet heard anything from the vascular clinic regarding her venous reflux studies. 07/02/2022: She has had a fair amount of drainage which is resulted in periwound tissue breakdown. There is erythema and irritation visible. The wound itself is smaller and the area where she had the blister form. Last week has actually healed there is slough on the wound surface with good granulation tissue underneath. She has her venous reflux studies right after this appointment. Electronic Signature(s) Signed: 07/02/2022 3:29:05 PM By: Duanne Guess MD FACS Entered By: Duanne Guess on 07/02/2022 15:29:05 -------------------------------------------------------------------------------- Physical Exam Details Patient Name: Date of Service: Tara, [name] 07/02/2022 2:00 PM Medical Record Number: [id number] Patient Account Number: [id number] Date of Birth/Sex: Treating RN: 09/10/1955 (67 y.o. F) Primary Care Provider: Elesa Massed Other Clinician: Referring Provider: Treating Provider/Extender: Madaline Guthrie in Treatment: 2 Constitutional Slightly hypertensive. . .  . no acute distress. Respiratory Normal work of breathing on room air. Notes 07/02/2022: She has had a fair amount of drainage which has resulted in periwound tissue breakdown. There is erythema and irritation visible. The wound itself is smaller and the area where she had the blister form last week has actually healed. There is slough on the wound surface with good granulation tissue underneath Electronic Signature(s) Signed: 07/02/2022 3:32:02 PM By: Duanne Guess MD FACS Entered By: Duanne Guess on 07/02/2022 15:32:01 Physician Orders Details -------------------------------------------------------------------------------- Carney Living ([id number]) [id number] Page 3 of 7 Patient Name: Date of ServiceADILENI, BROOKER 07/02/2022 2:00 PM Medical Record Number: [id number] Patient Account Number: [id number] Date of Birth/Sex: Treating RN: 08/23/1955 (67 y.o. Gevena Mart Primary Care Provider: Elesa Massed Other Clinician: Referring Provider: Treating Provider/Extender: Madaline Guthrie in Treatment: 2 Verbal / Phone Orders: No Diagnosis Coding ICD-10 Coding Code Description [phone] Non-pressure chronic ulcer of left ankle with fat layer exposed L97.321 Non-pressure chronic ulcer of left ankle limited to breakdown of skin I87.2 Venous insufficiency (chronic) (peripheral) K51.90 Ulcerative colitis, unspecified, without complications Follow-up Appointments ppointment in 1 week. - Dr, Lady Gary Rm 3 Return A Bathing/ Shower/ Hygiene May shower with protection but do not get wound dressing(s) wet. Protect dressing(s) with water repellant cover (for example, large plastic bag) or a cast cover and may then take shower. Edema Control - Lymphedema / SCD / Other Left Lower Extremity Elevate legs to the level of the heart or above for 30 minutes daily and/or when sitting for 3-4 times a day throughout the day. A void standing for long  periods of time. Patient to wear own compression stockings every day. Exercise regularly If compression wraps slide down please call wound center and speak with a nurse. Additional Orders / Instructions Wound #1 Left,Medial Ankle Follow Nutritious Diet Wound Treatment Wound #1 - Ankle Wound Laterality: Left, Medial Cleanser: Soap and Water Discharge Instructions: May shower and wash wound with dial antibacterial soap and water prior to dressing change. Peri-Wound Care: Triamcinolone 15 (g) Discharge Instructions: Use triamcinolone 15 (g) as directed Peri-Wound Care: Zinc Oxide Ointment 30g tube Discharge Instructions: Apply Zinc Oxide to periwound with each dressing change Peri-Wound Care: Sween Lotion (  Moisturizing lotion) Discharge Instructions: Apply moisturizing lotion as directed Topical: Ketoconazole Cream 2% Discharge Instructions: Apply Ketoconazole as directed Secured With: Paper Tape, 2x10 (in/yd) Discharge Instructions: Secure dressing with tape as directed. Compression Wrap: Urgo K2 Lite, two layer compression system, regular Discharge Instructions: Apply Urgo K2 Lite as directed (alternative to 3 layer compression). Electronic Signature(s) Signed: 07/02/2022 4:15:25 PM By: Duanne Guess MD FACS Entered By: Duanne Guess on 07/02/2022 15:32:12 -------------------------------------------------------------------------------- Problem List Details Patient Name: Date of Service: [name] 07/02/2022 2:00 PM Medical Record Number: [id number] Patient Account Number: [id number] Date of Birth/Sex: Treating RN: [birth year] (67 y.o. Gevena Mart Primary Care Provider: Elesa Massed Other Clinician: Referring Provider: Treating Provider/Extender: Astrid Drafts Garrison, [name] ([id number]) [id number] Page 4 of 7 Weeks in Treatment: 2 Active Problems ICD-10 Encounter Code Description Active Date MDM Diagnosis L97.322  Non-pressure chronic ulcer of left ankle with fat layer exposed 06/18/2022 No Yes L97.321 Non-pressure chronic ulcer of left ankle limited to breakdown of skin 06/25/2022 No Yes I87.2 Venous insufficiency (chronic) (peripheral) 06/18/2022 No Yes K51.90 Ulcerative colitis, unspecified, without complications 06/18/2022 No Yes Inactive Problems Resolved Problems Electronic Signature(s) Signed: 07/02/2022 3:13:04 PM By: Duanne Guess MD FACS Entered By: Duanne Guess on 07/02/2022 15:13:03 -------------------------------------------------------------------------------- Progress Note Details Patient Name: Date of Service: [name] 07/02/2022 2:00 PM Medical Record Number: [id number] Patient Account Number: [id number] Date of Birth/Sex: Treating RN: 09-11-1955 (67 y.o. F) Primary Care Provider: Elesa Massed Other Clinician: Referring Provider: Treating Provider/Extender: Madaline Guthrie in Treatment: 2 Subjective Chief Complaint Information obtained from Patient Patient presents for treatment of an open ulcer due to venous insufficiency History of Present Illness (HPI) ADMISSION This is a 67 year old woman with a history of ulcerative colitis, not currently on any immunosuppressive agents, who first noticed some left ankle swelling and a small wound in October 2023. She was seen in the emergency department and given a course of oral antibiotics. The wound did not really improve. Her primary care doctor attempted to apply an Unna boot, but it sounds as though it was not quite properly applied and it was very painful and she developed significant swelling above the boot. Since that time, she has simply been applying Neosporin to the site. The wound has gotten larger and as result, she was referred to the wound care center for further evaluation and management. She has worn compression stockings in the past, but was not wearing them when she developed the wound. ABI  today was 1.02. She is not diabetic, nor does she smoke. 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 On her left medial ankle, there is an irregular wound consistent with a venous stasis ulcer. There is slough accumulation on the surface. The moisture balance is tending towards dry. She does have hemosiderin deposition on the bilateral ankles along with 1+ nonpitting edema. 06/25/2022: The ulcer on her left medial ankle is a bit cleaner but still has a fair amount of slough accumulation. There is good granulation tissue emerging. She developed an blister just anterior to this, however, and it has broken open, leaving her with a small superficial ulcer. Edema control is improved. She has not yet heard anything from the vascular clinic regarding her venous reflux studies. 07/02/2022: She has had a fair amount of drainage which is resulted in periwound tissue breakdown. There is erythema and irritation visible. The wound itself is smaller and the  area where she had the blister form. Last week has actually healed there is slough on the wound surface with good granulation tissue underneath. She has her venous reflux studies right after this appointment. Patient History Information obtained from Patient. Family History Diabetes - Siblings, Heart Disease - Father, Hypertension - Mother, Stroke - Mother, No family history of Cancer, Hereditary Spherocytosis, Kidney Disease, Lung Disease, Seizures, Thyroid Problems, Tuberculosis. RAVEEN, STAMOS ([id number]) [id number] Page 5 of 7 Social History Never smoker, Marital Status - Widowed, Alcohol Use - Rarely, Drug Use - No History, Caffeine Use - Daily. Medical History Gastrointestinal Patient has history of Colitis Musculoskeletal Patient has history of Rheumatoid Arthritis Objective Constitutional Slightly hypertensive. no acute distress. Vitals Time Taken: 2:15 PM,  Height: 67 in, Weight: 225 lbs, BMI: 35.2, Temperature: 97.6 F, Pulse: 93 bpm, Respiratory Rate: 18 breaths/min, Blood Pressure: 146/88 mmHg. Respiratory Normal work of breathing on room air. General Notes: 07/02/2022: She has had a fair amount of drainage which has resulted in periwound tissue breakdown. There is erythema and irritation visible. The wound itself is smaller and the area where she had the blister form last week has actually healed. There is slough on the wound surface with good granulation tissue underneath Integumentary (Hair, Skin) Wound #1 status is Open. Original cause of wound was Gradually Appeared. The date acquired was: 11/23/2021. The wound has been in treatment 2 weeks. The wound is located on the Left,Medial Ankle. The wound measures 3.4cm length x 7cm width x 0.1cm depth; 18.692cm^2 area and 1.869cm^3 volume. There is Fat Layer (Subcutaneous Tissue) exposed. There is no tunneling or undermining noted. There is a medium amount of serosanguineous drainage noted. The wound margin is distinct with the outline attached to the wound base. There is medium (34-66%) red, pink granulation within the wound bed. There is a medium (34-66%) amount of necrotic tissue within the wound bed including Adherent Slough. The periwound skin appearance exhibited: Scarring, Hemosiderin Staining. The periwound skin appearance did not exhibit: Dry/Scaly, Maceration. Periwound temperature was noted as No Abnormality. Wound #2 status is Healed - Epithelialized. Original cause of wound was Blister. The date acquired was: 06/25/2022. The wound has been in treatment 1 weeks. The wound is located on the Right,Medial Lower Leg. The wound measures 0cm length x 0cm width x 0cm depth; 0cm^2 area and 0cm^3 volume. There is a medium amount of serosanguineous drainage noted. Assessment Active Problems ICD-10 Non-pressure chronic ulcer of left ankle with fat layer exposed Non-pressure chronic ulcer of left ankle  limited to breakdown of skin Venous insufficiency (chronic) (peripheral) Ulcerative colitis, unspecified, without complications Procedures Wound #1 Pre-procedure diagnosis of Wound #1 is a Venous Leg Ulcer located on the Left,Medial Ankle .Severity of Tissue Pre Debridement is: Fat layer exposed. There was a Excisional Skin/Subcutaneous Tissue Debridement with a total area of 1.87 sq cm performed by Duanne Guess, MD. With the following instrument(s): Curette to remove Viable and Non-Viable tissue/material. Material removed includes Subcutaneous Tissue and Slough and after achieving pain control using Lidocaine 4% Topical Solution. No specimens were taken. A time out was conducted at 14:30, prior to the start of the procedure. A Minimum amount of bleeding was controlled with Pressure. The procedure was tolerated well with a pain level of 0 throughout and a pain level of 0 following the procedure. Post Debridement Measurements: 3.4cm length x 0.7cm width x 0.1cm depth; 0.187cm^3 volume. Character of Wound/Ulcer Post Debridement is stable. Severity of Tissue Post Debridement is: Fat layer exposed. Post procedure  Diagnosis Wound #1: Same as Pre-Procedure General Notes: scribed for Dr. Lady Gary by Brenton Grills, RN. Plan Follow-up Appointments: CATERA, KISSLER ([id number]) [id number] Page 6 of 7 Return Appointment in 1 week. - Dr, Lady Gary Rm 3 Bathing/ Shower/ Hygiene: May shower with protection but do not get wound dressing(s) wet. Protect dressing(s) with water repellant cover (for example, large plastic bag) or a cast cover and may then take shower. Edema Control - Lymphedema / SCD / Other: Elevate legs to the level of the heart or above for 30 minutes daily and/or when sitting for 3-4 times a day throughout the day. Avoid standing for long periods of time. Patient to wear own compression stockings every day. Exercise regularly If compression wraps slide down please  call wound center and speak with a nurse. Additional Orders / Instructions: Wound #1 Left,Medial Ankle: Follow Nutritious Diet WOUND #1: - Ankle Wound Laterality: Left, Medial Cleanser: Soap and Water Discharge Instructions: May shower and wash wound with dial antibacterial soap and water prior to dressing change. Peri-Wound Care: Triamcinolone 15 (g) Discharge Instructions: Use triamcinolone 15 (g) as directed Peri-Wound Care: Zinc Oxide Ointment 30g tube Discharge Instructions: Apply Zinc Oxide to periwound with each dressing change Peri-Wound Care: Sween Lotion (Moisturizing lotion) Discharge Instructions: Apply moisturizing lotion as directed Topical: Ketoconazole Cream 2% Discharge Instructions: Apply Ketoconazole as directed Secured With: Paper T ape, 2x10 (in/yd) Discharge Instructions: Secure dressing with tape as directed. Com pression Wrap: Urgo K2 Lite, two layer compression system, regular Discharge Instructions: Apply Urgo K2 Lite as directed (alternative to 3 layer compression). 07/02/2022: She has had a fair amount of drainage which has resulted in periwound tissue breakdown. There is erythema and irritation visible. The wound itself is smaller and the area where she had the blister form last week has actually healed. There is slough on the wound surface with good granulation tissue underneath. I used a curette to debride the slough from the wound. We will use silver alginate to the wound surface and apply a mixture of ketoconazole, triamcinolone, and zinc oxide to the periwound to try and protect the area. We will cover this with a foam border dressing for today while she goes to have her venous reflux studies done. We have scheduled her for a nurse visit tomorrow to have her leg wrapped with a 3 layer compression equivalent. Follow-up with me in 1 week. Electronic Signature(s) Signed: 07/02/2022 3:33:46 PM By: Duanne Guess MD FACS Entered By: Duanne Guess on 07/02/2022  15:33:46 -------------------------------------------------------------------------------- HxROS Details Patient Name: Date of Service: [name] 07/02/2022 2:00 PM Medical Record Number: [id number] Patient Account Number: [id number] Date of Birth/Sex: Treating RN: 1956/02/09 (67 y.o. F) Primary Care Provider: Elesa Massed Other Clinician: Referring Provider: Treating Provider/Extender: Madaline Guthrie in Treatment: 2 Information Obtained From Patient Gastrointestinal Medical History: Positive for: Colitis Musculoskeletal Medical History: Positive for: Rheumatoid Arthritis Immunizations Pneumococcal Vaccine: Received Pneumococcal Vaccination: Yes Received Pneumococcal Vaccination On or After 60th Birthday: No Tetanus Vaccine: Last tetanus shot: 05/25/2015 Implantable Devices None [place], [name] ([id number]) [id number] Page 7 of 7 Family and Social History Cancer: No; Diabetes: Yes - Siblings; Heart Disease: Yes - Father; Hereditary Spherocytosis: No; Hypertension: Yes - Mother; Kidney Disease: No; Lung Disease: No; Seizures: No; Stroke: Yes - Mother; Thyroid Problems: No; Tuberculosis: No; Never smoker; Marital Status - Widowed; Alcohol Use: Rarely; Drug Use: No History; Caffeine Use: Daily; Financial Concerns: No; Food, Clothing or Shelter Needs: No; Support System Lacking: No; Transportation Concerns: No Electronic Signature(s)  Signed: 07/02/2022 4:15:25 PM By: Duanne Guess MD FACS Entered By: Duanne Guess on 07/02/2022 15:31:08 -------------------------------------------------------------------------------- SuperBill Details Patient Name: Date of Service: [name] 07/02/2022 Medical Record Number: [id number] Patient Account Number: [id number] Date of Birth/Sex: Treating RN: Jun 16, 1955 (67 y.o. Gevena Mart Primary Care Provider: Elesa Massed Other Clinician: Referring Provider: Treating Provider/Extender:  Madaline Guthrie in Treatment: 2 Diagnosis Coding ICD-10 Codes Code Description [phone] Non-pressure chronic ulcer of left ankle with fat layer exposed L97.321 Non-pressure chronic ulcer of left ankle limited to breakdown of skin I87.2 Venous insufficiency (chronic) (peripheral) K51.90 Ulcerative colitis, unspecified, without complications Facility Procedures : CPT4 Code: [id number] Description: 11042 - DEB SUBQ TISSUE 20 SQ CM/< ICD-10 Diagnosis Description L97.322 Non-pressure chronic ulcer of left ankle with fat layer exposed Modifier: Quantity: 1 Physician Procedures : CPT4 Code Description Modifier [id number] - WC PHYS LEVEL 4 - EST PT 25 ICD-10 Diagnosis Description L97.322 Non-pressure chronic ulcer of left ankle with fat layer exposed L97.321 Non-pressure chronic ulcer of left ankle limited to breakdown of  skin I87.2 Venous insufficiency (chronic) (peripheral) K51.90 Ulcerative colitis, unspecified, without complications Quantity: 1 : [id number] 11042 - WC PHYS SUBQ TISS 20 SQ CM ICD-10 Diagnosis Description L97.322 Non-pressure chronic ulcer of left ankle with fat layer exposed Quantity: 1 Electronic Signature(s) Signed: 07/02/2022 3:34:18 PM By: Duanne Guess MD FACS Entered By: Duanne Guess on 07/02/2022 15:34:18

## 2022-08-05 NOTE — Progress Notes (Signed)
[name] ([id number]) [id number] Page 1 of 8 Visit Report for 08/04/2022 Chief Complaint Document Details Patient Name: Date of Service: [name], [name] 08/04/2022 8:00 A M Medical Record Number: [id number] Patient Account Number: [id number] Date of Birth/Sex: Treating RN: 25-Feb-1955 (67 y.o. F) Primary Care Provider: Elesa Massed Other Clinician: Referring Provider: Treating Provider/Extender: Madaline Guthrie in Treatment: 6 Information Obtained from: Patient Chief Complaint Patient presents for treatment of an open ulcer due to venous insufficiency Electronic Signature(s) Signed: 08/04/2022 8:26:49 AM By: Duanne Guess MD FACS Entered By: Duanne Guess on 08/04/2022 08:26:49 -------------------------------------------------------------------------------- Debridement Details Patient Name: Date of Service: [name] 08/04/2022 8:00 A M Medical Record Number: [id number] Patient Account Number: [id number] Date of Birth/Sex: Treating RN: 03-Feb-1956 (67 y.o. [name] Primary Care Provider: Elesa Massed Other Clinician: Referring Provider: Treating Provider/Extender: Madaline Guthrie in Treatment: 6 Debridement Performed for Assessment: Wound #1 Left,Medial Ankle Performed By: Physician Duanne Guess, MD Debridement Type: Debridement Severity of Tissue Pre Debridement: Fat layer exposed Level of Consciousness (Pre-procedure): Awake and Alert Pre-procedure Verification/Time Out Yes - 08:19 Taken: Start Time: 08:19 Pain Control: Lidocaine 4% Topical Solution Percent of Wound Bed Debrided: 100% T Area Debrided (cm): otal 0.25 Tissue and other material debrided: Non-Viable, Eschar Level: Non-Viable Tissue Debridement Description: Selective/Open Wound Instrument: Curette Bleeding: Minimum Hemostasis Achieved: Pressure End Time: 08:21 Procedural Pain: 0 Post Procedural Pain: 0 Response  to Treatment: Procedure was tolerated well Level of Consciousness (Post- Awake and Alert procedure): Post Debridement Measurements of Total Wound Length: (cm) 0.8 Width: (cm) 0.4 Depth: (cm) 0.1 Volume: (cm) 0.025 Character of Wound/Ulcer Post Debridement: Improved Severity of Tissue Post Debridement: Fat layer exposed Post Procedure Diagnosis Same as Pre-procedure Notes Scribed for Dr. Lady Gary by J.Scotton [place], [name] ([id number]) [id number] Page 2 of 8 Electronic Signature(s) Signed: 08/04/2022 8:46:38 AM By: Duanne Guess MD FACS Signed: 08/04/2022 5:59:04 PM By: Karie Schwalbe RN Entered By: Karie Schwalbe on 08/04/2022 08:22:53 -------------------------------------------------------------------------------- HPI Details Patient Name: Date of Service: [name], Simi 08/04/2022 8:00 A M Medical Record Number: [id number] Patient Account Number: [id number] Date of Birth/Sex: Treating RN: 01-11-1956 (67 y.o. F) Primary Care Provider: Elesa Massed Other Clinician: Referring Provider: Treating Provider/Extender: Madaline Guthrie in Treatment: 6 History of Present Illness HPI Description: ADMISSION This is a 67 year old woman with a history of ulcerative colitis, not currently on any immunosuppressive agents, who first noticed some left ankle swelling and a small wound in October 2023. She was seen in the emergency department and given a course of oral antibiotics. The wound did not really improve. Her primary care doctor attempted to apply an Unna boot, but it sounds as though it was not quite properly applied and it was very painful and she developed significant swelling above the boot. Since that time, she has simply been applying Neosporin to the site. The wound has gotten larger and as result, she was referred to the wound care center for further evaluation and management. She has worn compression stockings in the past, but was  not wearing them when she developed the wound. ABI today was 1.02. She is not diabetic, nor does she smoke. 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 On her left medial ankle, there is an irregular wound consistent with a venous stasis ulcer. There is slough accumulation on the surface. The moisture balance is tending towards dry. She does have  hemosiderin deposition on the bilateral ankles along with 1+ nonpitting edema. 06/25/2022: The ulcer on her left medial ankle is a bit cleaner but still has a fair amount of slough accumulation. There is good granulation tissue emerging. She developed an blister just anterior to this, however, and it has broken open, leaving her with a small superficial ulcer. Edema control is improved. She has not yet heard anything from the vascular clinic regarding her venous reflux studies. 07/02/2022: She has had a fair amount of drainage which is resulted in periwound tissue breakdown. There is erythema and irritation visible. The wound itself is smaller and the area where she had the blister form Last week has actually healed. There is slough on the wound surface with good granulation tissue underneath. She has her venous reflux studies right after this appointment. 07/13/2022: The periwound skin looks better this week. The erythema and irritation has improved. The wound is smaller with just a little bit of slough on the surface. Her venous reflux studies were done and she has significant bilateral venous insufficiency. 07/27/2022: The wound is very nearly healed with just a small open portion under a layer of thin eschar. She did meet with vascular surgery and they are planning saphenous vein ablation once her wound is healed. 08/04/2022: There are just a couple of tiny openings remaining underneath some eschar. Edema control is good. Electronic Signature(s) Signed: 08/04/2022 8:27:29 AM By: Duanne Guess MD  FACS Entered By: Duanne Guess on 08/04/2022 08:27:29 -------------------------------------------------------------------------------- Physical Exam Details Patient Name: Date of Service: [name], [name] 08/04/2022 8:00 A M Medical Record Number: [id number] Patient Account Number: [id number] Date of Birth/Sex: Treating RN: 06-22-1955 (67 y.o. F) Primary Care Provider: Elesa Massed Other Clinician: Referring Provider: Treating Provider/Extender: Madaline Guthrie in Treatment: 6 Constitutional Slightly hypertensive. . . . no acute distress. Respiratory Normal work of breathing on room air. Notes 08/04/2022: There are just a couple of tiny openings remaining underneath some eschar. Edema control is good. Electronic Signature(s) Signed: 08/04/2022 8:28:00 AM By: Duanne Guess MD FACS Entered By: Duanne Guess on 08/04/2022 08:28:00 Carney Living ([id number]) [id number] Page 3 of 8 -------------------------------------------------------------------------------- Physician Orders Details Patient Name: Date of Service: ANDORA, GOODENOW 08/04/2022 8:00 A M Medical Record Number: [id number] Patient Account Number: [id number] Date of Birth/Sex: Treating RN: Oct 13, 1955 (67 y.o. [name] Primary Care Provider: Elesa Massed Other Clinician: Referring Provider: Treating Provider/Extender: Madaline Guthrie in Treatment: 6 Verbal / Phone Orders: No Diagnosis Coding ICD-10 Coding Code Description [phone] Non-pressure chronic ulcer of left ankle with fat layer exposed L97.321 Non-pressure chronic ulcer of left ankle limited to breakdown of skin I87.2 Venous insufficiency (chronic) (peripheral) K51.90 Ulcerative colitis, unspecified, without complications Follow-up Appointments ppointment in 1 week. - Dr. Lady Gary Room 2 Return A Tuesday 08/11/22 at 8:30am Anesthetic (In clinic) Topical Lidocaine 5% applied to  wound bed Bathing/ Shower/ Hygiene May shower with protection but do not get wound dressing(s) wet. Protect dressing(s) with water repellant cover (for example, large plastic bag) or a cast cover and may then take shower. - Please use cast protector on the left leg when taking a shower or bath. Edema Control - Lymphedema / SCD / Other Left Lower Extremity Elevate legs to the level of the heart or above for 30 minutes daily and/or when sitting for 3-4 times a day throughout the day. A void standing for long periods of time. Patient to wear own compression stockings every day. Exercise regularly If compression  wraps slide down please call wound center and speak with a nurse. Additional Orders / Instructions Wound #1 Left,Medial Ankle Follow Nutritious Diet Wound Treatment Wound #1 - Ankle Wound Laterality: Left, Medial Cleanser: Soap and Water 1 x Per Week/30 Days Discharge Instructions: May shower and wash wound with dial antibacterial soap and water prior to dressing change. Peri-Wound Care: Ketoconazole Cream 2% 1 x Per Week/30 Days Discharge Instructions: Apply Ketoconazole as directed Peri-Wound Care: Triamcinolone 15 (g) 1 x Per Week/30 Days Discharge Instructions: Use triamcinolone 15 (g) as directed Peri-Wound Care: Sween Lotion (Moisturizing lotion) 1 x Per Week/30 Days Discharge Instructions: Apply moisturizing lotion as directed Prim Dressing: Maxorb Extra Ag+ Alginate Dressing, 2x2 (in/in) 1 x Per Week/30 Days ary Discharge Instructions: Apply to wound bed as instructed Secondary Dressing: Woven Gauze Sponge, Non-Sterile 4x4 in 1 x Per Week/30 Days Discharge Instructions: Apply over primary dressing as directed. Compression Wrap: Urgo K2 Lite, (equivalent to a 3 layer) two layer compression system, regular 1 x Per Week/30 Days Discharge Instructions: Apply Urgo K2 Lite as directed (alternative to 3 layer compression). Compression Wrap: Netting, #5 1 x Per Week/30  Days Electronic Signature(s) Signed: 08/04/2022 8:46:38 AM By: Duanne Guess MD FACS Entered By: Duanne Guess on 08/04/2022 08:28:17 Carney Living ([id number]) [id number] Page 4 of 8 -------------------------------------------------------------------------------- Problem List Details Patient Name: Date of Service: JHANE, VANDERZWAAG 08/04/2022 8:00 A M Medical Record Number: [id number] Patient Account Number: [id number] Date of Birth/Sex: Treating RN: 11-22-1955 (67 y.o. F) Primary Care Provider: Elesa Massed Other Clinician: Referring Provider: Treating Provider/Extender: Madaline Guthrie in Treatment: 6 Active Problems ICD-10 Encounter Code Description Active Date MDM Diagnosis L97.322 Non-pressure chronic ulcer of left ankle with fat layer exposed 06/18/2022 No Yes L97.321 Non-pressure chronic ulcer of left ankle limited to breakdown of skin 06/25/2022 No Yes I87.2 Venous insufficiency (chronic) (peripheral) 06/18/2022 No Yes K51.90 Ulcerative colitis, unspecified, without complications 06/18/2022 No Yes Inactive Problems Resolved Problems Electronic Signature(s) Signed: 08/04/2022 8:26:35 AM By: Duanne Guess MD FACS Entered By: Duanne Guess on 08/04/2022 08:26:35 -------------------------------------------------------------------------------- Progress Note Details Patient Name: Date of Service: [name] 08/04/2022 8:00 A M Medical Record Number: [id number] Patient Account Number: [id number] Date of Birth/Sex: Treating RN: 1956/01/23 (67 y.o. F) Primary Care Provider: Elesa Massed Other Clinician: Referring Provider: Treating Provider/Extender: Madaline Guthrie in Treatment: 6 Subjective Chief Complaint Information obtained from Patient Patient presents for treatment of an open ulcer due to venous insufficiency History of Present Illness (HPI) ADMISSION This is a 67 year old woman with a  history of ulcerative colitis, not currently on any immunosuppressive agents, who first noticed some left ankle swelling and a small wound in October 2023. She was seen in the emergency department and given a course of oral antibiotics. The wound did not really improve. Her primary care doctor attempted to apply an Unna boot, but it sounds as though it was not quite properly applied and it was very painful and she developed significant swelling above the boot. Since that time, she has simply been applying Neosporin to the site. The wound has gotten larger and as result, she was referred to the wound care center for further evaluation and management. She has worn compression stockings in the past, but was not wearing them when she developed the wound. ABI today was 1.02. She is not diabetic, nor does she smoke. 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 On her left medial ankle, there is an irregular wound consistent with a venous stasis ulcer. There is slough accumulation on the surface. The moisture balance is tending towards dry. She does have hemosiderin deposition on the bilateral ankles along with 1+ nonpitting edema. 06/25/2022: The ulcer on her left medial ankle is a bit cleaner but still has a fair amount of slough accumulation. There is good granulation tissue emerging. She developed an blister just anterior to this, however, and it has broken open, leaving her with a small superficial ulcer. Edema control is improved. She has not ALICE, BURNSIDE ([id number]) [id number] Page 5 of 8 yet heard anything from the vascular clinic regarding her venous reflux studies. 07/02/2022: She has had a fair amount of drainage which is resulted in periwound tissue breakdown. There is erythema and irritation visible. The wound itself is smaller and the area where she had the blister form Last week has actually healed. There is  slough on the wound surface with good granulation tissue underneath. She has her venous reflux studies right after this appointment. 07/13/2022: The periwound skin looks better this week. The erythema and irritation has improved. The wound is smaller with just a little bit of slough on the surface. Her venous reflux studies were done and she has significant bilateral venous insufficiency. 07/27/2022: The wound is very nearly healed with just a small open portion under a layer of thin eschar. She did meet with vascular surgery and they are planning saphenous vein ablation once her wound is healed. 08/04/2022: There are just a couple of tiny openings remaining underneath some eschar. Edema control is good. Patient History Information obtained from Patient. Family History Diabetes - Siblings, Heart Disease - Father, Hypertension - Mother, Stroke - Mother, No family history of Cancer, Hereditary Spherocytosis, Kidney Disease, Lung Disease, Seizures, Thyroid Problems, Tuberculosis. Social History Never smoker, Marital Status - Widowed, Alcohol Use - Rarely, Drug Use - No History, Caffeine Use - Daily. Medical History Gastrointestinal Patient has history of Colitis Musculoskeletal Patient has history of Rheumatoid Arthritis Objective Constitutional Slightly hypertensive. no acute distress. Vitals Time Taken: 8:10 AM, Height: 67 in, Weight: 225 lbs, BMI: 35.2, Temperature: 97.6 F, Pulse: 91 bpm, Respiratory Rate: 16 breaths/min, Blood Pressure: 146/90 mmHg. Respiratory Normal work of breathing on room air. General Notes: 08/04/2022: There are just a couple of tiny openings remaining underneath some eschar. Edema control is good. Integumentary (Hair, Skin) Wound #1 status is Open. Original cause of wound was Gradually Appeared. The date acquired was: 11/23/2021. The wound has been in treatment 6 weeks. The wound is located on the Left,Medial Ankle. The wound measures 0.8cm length x 0.4cm width x 0.1cm  depth; 0.251cm^2 area and 0.025cm^3 volume. There is no tunneling or undermining noted. There is a medium amount of serosanguineous drainage noted. There is small (1-33%) red granulation within the wound bed. There is a large (67-100%) amount of necrotic tissue within the wound bed including Eschar and Adherent Slough. The periwound skin appearance had no abnormalities noted for moisture. The periwound skin appearance exhibited: Scarring, Hemosiderin Staining. Periwound temperature was noted as No Abnormality. 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 There was a Selective/Open Wound  Non-Viable Tissue Debridement with a total area of 0.25 sq cm performed by Duanne Guess, MD. With the following instrument(s): Curette to remove Non-Viable tissue/material. Material removed includes Eschar after achieving pain control using Lidocaine 4% Topical Solution. TRUDE, LUCKEN ([id number]) [id number] Page 6 of 8 No specimens were taken. A time out was conducted at 08:19, prior to the start of the procedure. A Minimum amount of bleeding was controlled with Pressure. The procedure was tolerated well with a pain level of 0 throughout and a pain level of 0 following the procedure. Post Debridement Measurements: 0.8cm length x 0.4cm width x 0.1cm depth; 0.025cm^3 volume. Character of Wound/Ulcer Post Debridement is improved. Severity of Tissue Post Debridement is: Fat layer exposed. Post procedure Diagnosis Wound #1: Same as Pre-Procedure General Notes: Scribed for Dr. Lady Gary by  J.Scotton. Pre-procedure diagnosis of Wound #1 is a Venous Leg Ulcer located on the Left,Medial Ankle . There was a Three Layer Compression Therapy Procedure by Karie Schwalbe, RN. Post procedure Diagnosis Wound #1: Same as Pre-Procedure Plan Follow-up Appointments: Return Appointment in 1 week. - Dr. Lady Gary Room 2 Tuesday 08/11/22 at 8:30am Anesthetic: (In clinic) Topical Lidocaine 5% applied to wound bed Bathing/ Shower/ Hygiene: May shower with protection but do not get wound dressing(s) wet. Protect dressing(s) with water repellant cover (for example, large plastic bag) or a cast cover and may then take shower. - Please use cast protector on the left leg when taking a shower or bath. Edema Control - Lymphedema / SCD / Other: Elevate legs to the level of the heart or above for 30 minutes daily and/or when sitting for 3-4 times a day throughout the day. Avoid standing for long periods of time. Patient to wear own compression stockings every day. Exercise regularly If compression wraps slide down please call wound center and speak with a nurse. Additional Orders / Instructions: Wound #1 Left,Medial Ankle: Follow Nutritious Diet WOUND #1: - Ankle Wound Laterality: Left, Medial Cleanser: Soap and Water 1 x Per Week/30 Days Discharge Instructions: May shower and wash wound with dial antibacterial soap and water prior to dressing change. Peri-Wound Care: Ketoconazole Cream 2% 1 x Per Week/30 Days Discharge Instructions: Apply Ketoconazole as directed Peri-Wound Care: Triamcinolone 15 (g) 1 x Per Week/30 Days Discharge Instructions: Use triamcinolone 15 (g) as directed Peri-Wound Care: Sween Lotion (Moisturizing lotion) 1 x Per Week/30 Days Discharge Instructions: Apply moisturizing lotion as directed Prim Dressing: Maxorb Extra Ag+ Alginate Dressing, 2x2 (in/in) 1 x Per Week/30 Days ary Discharge Instructions: Apply to wound bed as instructed Secondary Dressing: Woven Gauze Sponge,  Non-Sterile 4x4 in 1 x Per Week/30 Days Discharge Instructions: Apply over primary dressing as directed. Com pression Wrap: Urgo K2 Lite, (equivalent to a 3 layer) two layer compression system, regular 1 x Per Week/30 Days Discharge Instructions: Apply Urgo K2 Lite as directed (alternative to 3 layer compression). Com pression Wrap: Netting, #5 1 x Per Week/30 Days 08/04/2022: There are just a couple of tiny openings remaining underneath some eschar. Edema control is good. I used a curette to debride the eschar from her wound. We will continue silver alginate and 3 layer compression/equivalent. She will follow-up in 1 week, at which time I anticipate she will likely be completely healed. Electronic Signature(s) Signed: 08/04/2022 8:29:34 AM By: Duanne Guess MD FACS Entered By: Duanne Guess on 08/04/2022 08:29:34 -------------------------------------------------------------------------------- HxROS Details Patient Name: Date of Service: [name], KEYONNI 08/04/2022 8:00 A M Medical Record Number: [id number] Patient Account Number: [id number] Date of Birth/Sex:  Treating RN: 09-29-1955 (67 y.o. F) Primary Care Provider: Elesa Massed Other Clinician: Referring Provider: Treating Provider/Extender: Madaline Guthrie in Treatment: 6 Information Obtained From Patient Gastrointestinal Medical History: Positive for: Colitis [name], [name] ([id number]) [id number] Page 7 of 8 Musculoskeletal Medical History: Positive for: Rheumatoid Arthritis Immunizations Pneumococcal Vaccine: Received Pneumococcal Vaccination: Yes Received Pneumococcal Vaccination On or After 60th Birthday: No Tetanus Vaccine: Last tetanus shot: 05/25/2015 Implantable Devices None Family and Social History Cancer: No; Diabetes: Yes - Siblings; Heart Disease: Yes - Father; Hereditary Spherocytosis: No; Hypertension: Yes - Mother; Kidney Disease: No; Lung Disease: No;  Seizures: No; Stroke: Yes - Mother; Thyroid Problems: No; Tuberculosis: No; Never smoker; Marital Status - Widowed; Alcohol Use: Rarely; Drug Use: No History; Caffeine Use: Daily; Financial Concerns: No; Food, Clothing or Shelter Needs: No; Support System Lacking: No; Transportation Concerns: No Electronic Signature(s) Signed: 08/04/2022 8:46:38 AM By: Duanne Guess MD FACS Entered By: Duanne Guess on 08/04/2022 08:27:36 -------------------------------------------------------------------------------- SuperBill Details Patient Name: Date of Service: [name] 08/04/2022 Medical Record Number: [id number] Patient Account Number: [id number] Date of Birth/Sex: Treating RN: 23-Oct-1955 (67 y.o. F) Primary Care Provider: Elesa Massed Other Clinician: Referring Provider: Treating Provider/Extender: Madaline Guthrie in Treatment: 6 Diagnosis Coding ICD-10 Codes Code Description [phone] Non-pressure chronic ulcer of left ankle with fat layer exposed L97.321 Non-pressure chronic ulcer of left ankle limited to breakdown of skin I87.2 Venous insufficiency (chronic) (peripheral) K51.90 Ulcerative colitis, unspecified, without complications Facility Procedures : CPT4 Code: [id number] Description: 97597 - DEBRIDE WOUND 1ST 20 SQ CM OR < ICD-10 Diagnosis Description L97.322 Non-pressure chronic ulcer of left ankle with fat layer exposed Modifier: Quantity: 1 Physician Procedures : CPT4 Code Description Modifier [id number] - WC PHYS LEVEL 3 - EST PT 25 ICD-10 Diagnosis Description L97.322 Non-pressure chronic ulcer of left ankle with fat layer exposed I87.2 Venous insufficiency (chronic) (peripheral) Quantity: 1 : [id number] - WC PHYS DEBR WO ANESTH 20 SQ CM ICD-10 Diagnosis Description L97.322 Non-pressure chronic ulcer of left ankle with fat layer exposed Quantity: 1 Electronic Signature(s) Signed: 08/04/2022 8:30:14 AM By: Duanne Guess MD FACS [place], [name]  ([id number]) [id number] Page 8 of 8 Entered By: Duanne Guess on 08/04/2022 08:30:14

## 2022-08-05 NOTE — Progress Notes (Signed)
KEALI, MCCRAW ([id number]) [id number] Page 1 of 7 Visit Report for 08/04/2022 Arrival Information Details Patient Name: Date of Service: [name], [name] 08/04/2022 8:00 A M Medical Record Number: [id number] Patient Account Number: [id number] Date of Birth/Sex: Treating RN: December 28, 1955 (67 y.o. [name] Primary Care Demetries Coia: Elesa Massed Other Clinician: Referring Elita Dame: Treating Khris Jansson/Extender: Madaline Guthrie in Treatment: 6 Visit Information History Since Last Visit Added or deleted any medications: No Patient Arrived: Ambulatory Any new allergies or adverse reactions: No Arrival Time: 07:57 Had a fall or experienced change in No Accompanied By: self activities of daily living that may affect Transfer Assistance: None risk of falls: Patient Identification Verified: Yes Signs or symptoms of abuse/neglect since last visito No Patient Requires Transmission-Based Precautions: No Hospitalized since last visit: No Patient Has Alerts: No Implantable device outside of the clinic excluding No cellular tissue based products placed in the center since last visit: Has Dressing in Place as Prescribed: Yes Has Compression in Place as Prescribed: Yes Pain Present Now: No Electronic Signature(s) Signed: 08/04/2022 5:59:04 PM By: Karie Schwalbe RN Entered By: Karie Schwalbe on 08/04/2022 08:19:35 -------------------------------------------------------------------------------- Compression Therapy Details Patient Name: Date of Service: [name], MIN 08/04/2022 8:00 A M Medical Record Number: [id number] Patient Account Number: [id number] Date of Birth/Sex: Treating RN: 1955-06-28 (67 y.o. [name] Primary Care Zyair Rhein: Elesa Massed Other Clinician: Referring Elward Nocera: Treating Lucila Klecka/Extender: Madaline Guthrie in Treatment: 6 Compression Therapy Performed for Wound Assessment: Wound #1  Left,Medial Ankle Performed By: Clinician Karie Schwalbe, RN Compression Type: Three Layer Post Procedure Diagnosis Same as Pre-procedure Electronic Signature(s) Signed: 08/04/2022 5:59:04 PM By: Karie Schwalbe RN Entered By: Karie Schwalbe on 08/04/2022 08:23:06 -------------------------------------------------------------------------------- Encounter Discharge Information Details Patient Name: Date of Service: [name] 08/04/2022 8:00 A M Medical Record Number: [id number] Patient Account Number: [id number] Date of Birth/Sex: Treating RN: [birth year] (67 y.o. [name] Primary Care Kittie Krizan: Elesa Massed Other Clinician: Referring Rileyann Florance: Treating Arion Morgan/Extender: Madaline Guthrie in Treatment: 6 Encounter Discharge Information Items Post Procedure Vitals Discharge Condition: Stable Temperature (F): 97.6 Ambulatory Status: Ambulatory Pulse (bpm): 91 Discharge Destination: Home Respiratory Rate (breaths/min): 16 Transportation: Private Auto Blood Pressure (mmHg): 146/90 BREEZIE, MICUCCI ([id number]) [id number] Page 2 of 7 Accompanied By: SELF Schedule Follow-up Appointment: Yes Clinical Summary of Care: Patient Declined Electronic Signature(s) Signed: 08/04/2022 5:59:04 PM By: Karie Schwalbe RN Entered By: Karie Schwalbe on 08/04/2022 08:49:14 -------------------------------------------------------------------------------- Lower Extremity Assessment Details Patient Name: Date of Service: [name], [name] 08/04/2022 8:00 A M Medical Record Number: [id number] Patient Account Number: [id number] Date of Birth/Sex: Treating RN: 03/16/1955 (68 y.o. [name] Primary Care Angala Hilgers: Elesa Massed Other Clinician: Referring Larkin Morelos: Treating Eletha Culbertson/Extender: Madaline Guthrie in Treatment: 6 Edema Assessment Assessed: [Left: No] [Right: No] Edema: [Left: Ye] [Right: s] Calf Left:  Right: Point of Measurement: From Medial Instep 35.2 cm Ankle Left: Right: Point of Measurement: From Medial Instep 26.1 cm Vascular Assessment Pulses: Dorsalis Pedis Palpable: [Left:Yes] Electronic Signature(s) Signed: 08/04/2022 5:59:04 PM By: Karie Schwalbe RN Entered By: Karie Schwalbe on 08/04/2022 08:20:45 -------------------------------------------------------------------------------- Multi Wound Chart Details Patient Name: Date of Service: [name], [name] 08/04/2022 8:00 A M Medical Record Number: [id number] Patient Account Number: [id number] Date of Birth/Sex: Treating RN: [birth year] (67 y.o. F) Primary Care Mylynn Dinh: Elesa Massed Other Clinician: Referring Rickesha Veracruz: Treating Torrey Horseman/Extender: Madaline Guthrie in Treatment: 6 Vital Signs Height(in): 67 Pulse(bpm): 91 Weight(lbs): 225 Blood Pressure(mmHg): 146/90 Body Mass Index(BMI):  35.2 Temperature(F): 97.6 Respiratory Rate(breaths/min): 16 [1:Photos:] [N/A:N/A] Left, Medial Ankle N/A N/A Wound Location: Gradually Appeared N/A N/A Wounding Event: Venous Leg Ulcer N/A N/A Primary Etiology: Colitis, Rheumatoid Arthritis N/A N/A Comorbid History: 11/23/2021 N/A N/A Date Acquired: 6 N/A N/A Weeks of Treatment: Open N/A N/A Wound Status: No N/A N/A Wound Recurrence: 0.8x0.4x0.1 N/A N/A Measurements L x W x D (cm) 0.251 N/A N/A A (cm) : rea 0.025 N/A N/A Volume (cm) : 95.10% N/A N/A % Reduction in A rea: 97.50% N/A N/A % Reduction in Volume: Full Thickness Without Exposed N/A N/A Classification: Support Structures Medium N/A N/A Exudate A mount: Serosanguineous N/A N/A Exudate Type: red, brown N/A N/A Exudate Color: Small (1-33%) N/A N/A Granulation A mount: Red N/A N/A Granulation Quality: Large (67-100%) N/A N/A Necrotic A mount: Eschar, Adherent Slough N/A N/A Necrotic Tissue: Fascia: No N/A N/A Exposed Structures: Fat Layer (Subcutaneous Tissue): No Tendon:  No Muscle: No Joint: No Bone: No Medium (34-66%) N/A N/A Epithelialization: Debridement - Selective/Open Wound N/A N/A Debridement: Pre-procedure Verification/Time Out 08:19 N/A N/A Taken: Lidocaine 4% Topical Solution N/A N/A Pain Control: Necrotic/Eschar N/A N/A Tissue Debrided: Non-Viable Tissue N/A N/A Level: 0.25 N/A N/A Debridement A (sq cm): rea Curette N/A N/A Instrument: Minimum N/A N/A Bleeding: Pressure N/A N/A Hemostasis A chieved: 0 N/A N/A Procedural Pain: 0 N/A N/A Post Procedural Pain: Procedure was tolerated well N/A N/A Debridement Treatment Response: 0.8x0.4x0.1 N/A N/A Post Debridement Measurements L x W x D (cm) 0.025 N/A N/A Post Debridement Volume: (cm) Scarring: Yes N/A N/A Periwound Skin Texture: Maceration: No N/A N/A Periwound Skin Moisture: Dry/Scaly: No Hemosiderin Staining: Yes N/A N/A Periwound Skin Color: No Abnormality N/A N/A Temperature: Compression Therapy N/A N/A Procedures Performed: Debridement Treatment Notes Electronic Signature(s) Signed: 08/04/2022 8:26:43 AM By: Duanne Guess MD FACS Entered By: Duanne Guess on 08/04/2022 08:26:43 -------------------------------------------------------------------------------- Multi-Disciplinary Care Plan Details Patient Name: Date of Service: [name] 08/04/2022 8:00 A M Medical Record Number: [id number] Patient Account Number: [id number] Date of Birth/Sex: Treating RN: January 15, 1956 (67 y.o. [name] Primary Care Nature Vogelsang: Elesa Massed Other Clinician: Referring Reneshia Zuccaro: Treating Mckenzie Toruno/Extender: Madaline Guthrie in Treatment: 6 Multidisciplinary Care Plan reviewed with physician Active Inactive Venous Leg Ulcer ALITZEL, SIEVER ([id number]) [id number] Page 4 of 7 Nursing Diagnoses: Actual venous Insuffiency (use after diagnosis is confirmed) Knowledge deficit related to disease process and  management Potential for venous Insuffiency (use before diagnosis confirmed) Goals: Patient will maintain optimal edema control Date Initiated: 07/13/2022 Target Resolution Date: 09/23/2022 Goal Status: Active Interventions: Assess peripheral edema status every visit. Compression as ordered Treatment Activities: Therapeutic compression applied : 07/13/2022 Notes: Wound/Skin Impairment Nursing Diagnoses: Impaired tissue integrity Goals: Patient/caregiver will verbalize understanding of skin care regimen Date Initiated: 06/18/2022 Target Resolution Date: 09/23/2022 Goal Status: Active Interventions: Assess patient/caregiver ability to obtain necessary supplies Assess patient/caregiver ability to perform ulcer/skin care regimen upon admission and as needed Assess ulceration(s) every visit Provide education on ulcer and skin care Treatment Activities: Skin care regimen initiated : 06/18/2022 Topical wound management initiated : 06/18/2022 Notes: Electronic Signature(s) Signed: 08/04/2022 5:59:04 PM By: Karie Schwalbe RN Entered By: Karie Schwalbe on 08/04/2022 08:41:48 -------------------------------------------------------------------------------- Pain Assessment Details Patient Name: Date of Service: ASJA, STAKE 08/04/2022 8:00 A M Medical Record Number: [id number] Patient Account Number: [id number] Date of Birth/Sex: Treating RN: 05-07-1955 (67 y.o. [name] Primary Care Danalee Flath: Elesa Massed Other Clinician: Referring Laurette Villescas: Treating Manroop Jakubowicz/Extender: Madaline Guthrie in Treatment: 6  Active Problems Location of Pain Severity and Description of Pain Patient Has Paino No Site Locations [place], [place] ([id number]) [id number] Page 5 of 7 Pain Management and Medication Current Pain Management: Electronic Signature(s) Signed: 08/04/2022 5:59:04 PM By: Karie Schwalbe RN Entered By: Karie Schwalbe on 08/04/2022  08:20:24 -------------------------------------------------------------------------------- Patient/Caregiver Education Details Patient Name: Date of Service: [name] 6/11/2024andnbsp8:00 A M Medical Record Number: [id number] Patient Account Number: [id number] Date of Birth/Gender: Treating RN: 16-Mar-1955 (67 y.o. [name] Primary Care Physician: Elesa Massed Other Clinician: Referring Physician: Treating Physician/Extender: Madaline Guthrie in Treatment: 6 Education Assessment Education Provided To: Patient Education Topics Provided Wound/Skin Impairment: Methods: Explain/Verbal Responses: Return demonstration correctly Electronic Signature(s) Signed: 08/04/2022 5:59:04 PM By: Karie Schwalbe RN Entered By: Karie Schwalbe on 08/04/2022 08:42:05 -------------------------------------------------------------------------------- Wound Assessment Details Patient Name: Date of Service: SHALINDA, KUSKE 08/04/2022 8:00 A M Medical Record Number: [id number] Patient Account Number: [id number] Date of Birth/Sex: Treating RN: Jan 20, 1956 (67 y.o. [name] Primary Care Margie Brink: Elesa Massed Other Clinician: Referring Taran Haynesworth: Treating Kyrstan Gotwalt/Extender: Madaline Guthrie in Treatment: 6 Wound Status Wound Number: 1 Primary Etiology: Venous Leg Ulcer Wound Location: Left, Medial Ankle Wound Status: Open Wounding Event: Gradually Appeared Comorbid History: Colitis, Rheumatoid Arthritis Date Acquired: 11/23/2021 Weeks Of Treatment: 6 Clustered Wound: No KEELY, KEMNER ([id number]) [id number] Page 6 of 7 Photos Wound Measurements Length: (cm) 0.8 Width: (cm) 0.4 Depth: (cm) 0.1 Area: (cm) 0.251 Volume: (cm) 0.025 % Reduction in Area: 95.1% % Reduction in Volume: 97.5% Epithelialization: Medium (34-66%) Tunneling: No Undermining: No Wound Description Classification: Full Thickness Without  Exposed Suppor Exudate Amount: Medium Exudate Type: Serosanguineous Exudate Color: red, brown t Structures Foul Odor After Cleansing: No Slough/Fibrino Yes Wound Bed Granulation Amount: Small (1-33%) Exposed Structure Granulation Quality: Red Fascia Exposed: No Necrotic Amount: Large (67-100%) Fat Layer (Subcutaneous Tissue) Exposed: No Necrotic Quality: Eschar, Adherent Slough Tendon Exposed: No Muscle Exposed: No Joint Exposed: No Bone Exposed: No Periwound Skin Texture Texture Color No Abnormalities Noted: No No Abnormalities Noted: No Scarring: Yes Hemosiderin Staining: Yes Moisture Temperature / Pain No Abnormalities Noted: Yes Temperature: No Abnormality Treatment Notes Wound #1 (Ankle) Wound Laterality: Left, Medial Cleanser Soap and Water Discharge Instruction: May shower and wash wound with dial antibacterial soap and water prior to dressing change. Peri-Wound Care Ketoconazole Cream 2% Discharge Instruction: Apply Ketoconazole as directed Triamcinolone 15 (g) Discharge Instruction: Use triamcinolone 15 (g) as directed Sween Lotion (Moisturizing lotion) Discharge Instruction: Apply moisturizing lotion as directed Topical Primary Dressing Maxorb Extra Ag+ Alginate Dressing, 2x2 (in/in) Discharge Instruction: Apply to wound bed as instructed Secondary Dressing Woven Gauze Sponge, Non-Sterile 4x4 in Discharge Instruction: Apply over primary dressing as directed. Secured With Compression Marshawna Lippitt, [name] ([id number]) [id number] Page 7 of 7 Urgo K2 Lite, (equivalent to a 3 layer) two layer compression system, regular Discharge Instruction: Apply Urgo K2 Lite as directed (alternative to 3 layer compression). Netting, #5 Compression Stockings Add-Ons Electronic Signature(s) Signed: 08/04/2022 5:59:04 PM By: Karie Schwalbe RN Entered By: Karie Schwalbe on 08/04/2022  08:14:33 -------------------------------------------------------------------------------- Vitals Details Patient Name: Date of Service: [name], Ferrell 08/04/2022 8:00 A M Medical Record Number: [id number] Patient Account Number: [id number] Date of Birth/Sex: Treating RN: 1955-09-18 (67 y.o. [name] Primary Care Nicolemarie Wooley: Elesa Massed Other Clinician: Referring Chavon Lucarelli: Treating Kelsee Preslar/Extender: Madaline Guthrie in Treatment: 6 Vital Signs Time Taken: 08:10 Temperature (F): 97.6 Height (in): 67 Pulse (bpm): 91 Weight (lbs): 225 Respiratory Rate (breaths/min): 16  Body Mass Index (BMI): 35.2 Blood Pressure (mmHg): 146/90 Reference Range: 80 - 120 mg / dl Electronic Signature(s) Signed: 08/04/2022 5:59:04 PM By: Karie Schwalbe RN Entered By: Karie Schwalbe on 08/04/2022 08:20:17

## 2022-08-11 ENCOUNTER — Encounter (HOSPITAL_BASED_OUTPATIENT_CLINIC_OR_DEPARTMENT_OTHER): Payer: Medicare Other | Admitting: General Surgery

## 2022-08-11 DIAGNOSIS — L97322 Non-pressure chronic ulcer of left ankle with fat layer exposed: Secondary | ICD-10-CM | POA: Diagnosis not present

## 2022-08-11 NOTE — Progress Notes (Signed)
KINZEY, MALENA ([id number]) [id number] Page 1 of 8 Visit Report for 08/11/2022 Arrival Information Details Patient Name: Date of Service: [name], [name] 08/11/2022 8:30 A M Medical Record Number: [id number] Patient Account Number: [id number] Date of Birth/Sex: Treating RN: [birth year] (67 y.o. Caro Hight, Ladona Ridgel Primary Care Esbeydi Manago: Elesa Massed Other Clinician: Referring Brenly Trawick: Treating Maven Varelas/Extender: Madaline Guthrie in Treatment: 7 Visit Information History Since Last Visit Added or deleted any medications: No Patient Arrived: Ambulatory Any new allergies or adverse reactions: No Arrival Time: 08:41 Had a fall or experienced change in No Accompanied By: self activities of daily living that may affect Transfer Assistance: None risk of falls: Patient Identification Verified: Yes Signs or symptoms of abuse/neglect since last visito No Secondary Verification Process Completed: Yes Hospitalized since last visit: No Patient Requires Transmission-Based Precautions: No Implantable device outside of the clinic excluding No Patient Has Alerts: No cellular tissue based products placed in the center since last visit: Has Dressing in Place as Prescribed: Yes Has Compression in Place as Prescribed: Yes Pain Present Now: Yes Electronic Signature(s) Signed: 08/11/2022 4:05:21 PM By: Samuella Bruin Entered By: Samuella Bruin on 08/11/2022 08:41:59 -------------------------------------------------------------------------------- Compression Therapy Details Patient Name: Date of Service: SMRITHI, NILSSON 08/11/2022 8:30 A M Medical Record Number: [id number] Patient Account Number: [id number] Date of Birth/Sex: Treating RN: [birth year] (67 y.o. [name] Primary Care Tri Chittick: Elesa Massed Other Clinician: Referring Amilya Haver: Treating Pape Parson/Extender: Madaline Guthrie in Treatment: 7 Compression  Therapy Performed for Wound Assessment: Wound #1 Left,Medial Ankle Performed By: Clinician Samuella Bruin, RN Compression Type: Double Layer Post Procedure Diagnosis Same as Pre-procedure Electronic Signature(s) Signed: 08/11/2022 4:05:21 PM By: Samuella Bruin Entered By: Samuella Bruin on 08/11/2022 08:52:23 [facility] ([id number]) [id number] Page 2 of 8 -------------------------------------------------------------------------------- Encounter Discharge Information Details Patient Name: Date of Service: JEFFREY, GEESAMAN 08/11/2022 8:30 A M Medical Record Number: [id number] Patient Account Number: [id number] Date of Birth/Sex: Treating RN: 1955/03/31 (67 y.o. [name] Primary Care Nishaan Stanke: Elesa Massed Other Clinician: Referring Jase Himmelberger: Treating Meghana Tullo/Extender: Madaline Guthrie in Treatment: 7 Encounter Discharge Information Items Post Procedure Vitals Discharge Condition: Stable Temperature (F): 98.3 Ambulatory Status: Ambulatory Pulse (bpm): 97 Discharge Destination: Home Respiratory Rate (breaths/min): 18 Transportation: Private Auto Blood Pressure (mmHg): 141/97 Accompanied By: self Schedule Follow-up Appointment: Yes Clinical Summary of Care: Patient Declined Electronic Signature(s) Signed: 08/11/2022 4:05:21 PM By: Samuella Bruin Entered By: Samuella Bruin on 08/11/2022 09:03:27 -------------------------------------------------------------------------------- Lower Extremity Assessment Details Patient Name: Date of Service: ABRISH, [name] 08/11/2022 8:30 A M Medical Record Number: [id number] Patient Account Number: [id number] Date of Birth/Sex: Treating RN: 23-Feb-1956 (67 y.o. [name] Primary Care Obadiah Dennard: Elesa Massed Other Clinician: Referring Katlin Bortner: Treating Malekai Markwood/Extender: Madaline Guthrie in Treatment: 7 Edema Assessment Assessed: [Left:  No] [Right: No] Edema: [Left: Ye] [Right: s] Calf Left: Right: Point of Measurement: From Medial Instep 38.2 cm Ankle Left: Right: Point of Measurement: From Medial Instep 26 cm Vascular Assessment Pulses: Dorsalis Pedis Palpable: [Left:Yes] Electronic Signature(s) Signed: 08/11/2022 4:05:21 PM By: Samuella Bruin Entered By: Samuella Bruin on 08/11/2022 08:46:15 Multi Wound Chart Details -------------------------------------------------------------------------------- [facility] ([id number]) [id number] Page 3 of 8 Patient Name: Date of Service: Tara, [name] 08/11/2022 8:30 A M Medical Record Number: [id number] Patient Account Number: [id number] Date of Birth/Sex: Treating RN: Nov 12, 1955 (67 y.o. F) Primary Care Clotee Schlicker: Elesa Massed Other Clinician: Referring Donnetta Gillin: Treating Avier Jech/Extender: Madaline Guthrie in Treatment: 7 Vital Signs Height(in): 67 Pulse(bpm):  97 Weight(lbs): 225 Blood Pressure(mmHg): 141/97 Body Mass Index(BMI): 35.2 Temperature(F): 98.3 Respiratory Rate(breaths/min): 18 Wound Assessments Wound Number: 1 N/A N/A Photos: N/A N/A Left, Medial Ankle N/A N/A Wound Location: Gradually Appeared N/A N/A Wounding Event: Venous Leg Ulcer N/A N/A Primary Etiology: Colitis, Rheumatoid Arthritis N/A N/A Comorbid History: 11/23/2021 N/A N/A Date Acquired: 7 N/A N/A Weeks of Treatment: Open N/A N/A Wound Status: No N/A N/A Wound Recurrence: Yes N/A N/A Clustered Wound: 2 N/A N/A Clustered Quantity: 3x0.5x0.1 N/A N/A Measurements L x W x D (cm) 1.178 N/A N/A A (cm) : rea 0.118 N/A N/A Volume (cm) : 76.80% N/A N/A % Reduction in A rea: 88.40% N/A N/A % Reduction in Volume: Full Thickness Without Exposed N/A N/A Classification: Support Structures Medium N/A N/A Exudate A mount: Serosanguineous N/A N/A Exudate Type: red, brown N/A N/A Exudate Color: Distinct, outline attached  N/A N/A Wound Margin: Large (67-100%) N/A N/A Granulation A mount: Red N/A N/A Granulation Quality: Small (1-33%) N/A N/A Necrotic A mount: Fat Layer (Subcutaneous Tissue): Yes N/A N/A Exposed Structures: Fascia: No Tendon: No Muscle: No Joint: No Bone: No Medium (34-66%) N/A N/A Epithelialization: Debridement - Selective/Open Wound N/A N/A Debridement: Pre-procedure Verification/Time Out 08:50 N/A N/A Taken: Lidocaine 4% Topical Solution N/A N/A Pain Control: Necrotic/Eschar, Slough N/A N/A Tissue Debrided: Non-Viable Tissue N/A N/A Level: 0.88 N/A N/A Debridement A (sq cm): rea Curette N/A N/A Instrument: Minimum N/A N/A Bleeding: Pressure N/A N/A Hemostasis A chieved: Procedure was tolerated well N/A N/A Debridement Treatment Response: 3x0.5x0.1 N/A N/A Post Debridement Measurements L x W x D (cm) 0.118 N/A N/A Post Debridement Volume: (cm) Scarring: Yes N/A N/A Periwound Skin Texture: Maceration: No N/A N/A Periwound Skin Moisture: Dry/Scaly: No Hemosiderin Staining: Yes N/A N/A Periwound Skin Color: No Abnormality N/A N/A Temperature: Compression Therapy N/A N/A Procedures Performed: Debridement Treatment Notes AMYTHEST, ORAMA ([id number]) [id number] Page 4 of 8 Electronic Signature(s) Signed: 08/11/2022 8:56:01 AM By: Duanne Guess MD FACS Entered By: Duanne Guess on 08/11/2022 08:56:01 -------------------------------------------------------------------------------- Multi-Disciplinary Care Plan Details Patient Name: Date of Service: [name] 08/11/2022 8:30 A M Medical Record Number: [id number] Patient Account Number: [id number] Date of Birth/Sex: Treating RN: 1955-05-18 (67 y.o. [name] Primary Care Flois Mctague: Elesa Massed Other Clinician: Referring Bryn Saline: Treating Dallis Czaja/Extender: Madaline Guthrie in Treatment: 7 Multidisciplinary Care Plan reviewed with  physician Active Inactive Venous Leg Ulcer Nursing Diagnoses: Actual venous Insuffiency (use after diagnosis is confirmed) Knowledge deficit related to disease process and management Potential for venous Insuffiency (use before diagnosis confirmed) Goals: Patient will maintain optimal edema control Date Initiated: 07/13/2022 Target Resolution Date: 09/23/2022 Goal Status: Active Interventions: Assess peripheral edema status every visit. Compression as ordered Treatment Activities: Therapeutic compression applied : 07/13/2022 Notes: Wound/Skin Impairment Nursing Diagnoses: Impaired tissue integrity Goals: Patient/caregiver will verbalize understanding of skin care regimen Date Initiated: 06/18/2022 Target Resolution Date: 09/23/2022 Goal Status: Active Interventions: Assess patient/caregiver ability to obtain necessary supplies Assess patient/caregiver ability to perform ulcer/skin care regimen upon admission and as needed Assess ulceration(s) every visit Provide education on ulcer and skin care Treatment Activities: Skin care regimen initiated : 06/18/2022 Topical wound management initiated : 06/18/2022 Notes: Electronic Signature(s) Signed: 08/11/2022 4:05:21 PM By: Samuella Bruin Entered By: Samuella Bruin on 08/11/2022 08:51:59 [facility] ([id number]) [id number] Page 5 of 8 -------------------------------------------------------------------------------- Pain Assessment Details Patient Name: Date of Service: LIVIA, SLATTEN 08/11/2022 8:30 A M Medical Record Number: [id number] Patient Account Number: [id number] Date of Birth/Sex: Treating RN: [birth year] (  67 y.o. [name] Primary Care Lakecia Deschamps: Elesa Massed Other Clinician: Referring Shlok Raz: Treating Kelsey Durflinger/Extender: Madaline Guthrie in Treatment: 7 Active Problems Location of Pain Severity and Description of Pain Patient Has Paino Yes Site  Locations Pain Location: Pain in Ulcers Duration of the Pain. Constant / Intermittento Intermittent Rate the pain. Current Pain Level: 1 Character of Pain Describe the Pain: Tender Pain Management and Medication Current Pain Management: Electronic Signature(s) Signed: 08/11/2022 4:05:21 PM By: Samuella Bruin Entered By: Samuella Bruin on 08/11/2022 08:42:21 -------------------------------------------------------------------------------- Patient/Caregiver Education Details Patient Name: Date of Service: [name] 6/18/2024andnbsp8:30 A M Medical Record Number: [id number] Patient Account Number: [id number] Date of Birth/Gender: Treating RN: [birth year] (67 y.o. [name] Primary Care Physician: Elesa Massed Other Clinician: Referring Physician: Treating Physician/Extender: Madaline Guthrie in Treatment: 7 Education Assessment Education Provided To: Patient Education Topics Provided Safety: CAILY, VASILAKIS ([id number]) [id number] Page 6 of 8 Methods: Explain/Verbal Responses: Reinforcements needed, State content correctly Electronic Signature(s) Signed: 08/11/2022 4:05:21 PM By: Samuella Bruin Entered By: Samuella Bruin on 08/11/2022 08:52:14 -------------------------------------------------------------------------------- Wound Assessment Details Patient Name: Date of Service: DRAYAH, BARCENA 08/11/2022 8:30 A M Medical Record Number: [id number] Patient Account Number: [id number] Date of Birth/Sex: Treating RN: 04-13-1955 (67 y.o. [name] Primary Care Deldrick Linch: Elesa Massed Other Clinician: Referring Barney Russomanno: Treating Malick Netz/Extender: Madaline Guthrie in Treatment: 7 Wound Status Wound Number: 1 Primary Etiology: Venous Leg Ulcer Wound Location: Left, Medial Ankle Wound Status: Open Wounding Event: Gradually Appeared Comorbid History: Colitis, Rheumatoid  Arthritis Date Acquired: 11/23/2021 Weeks Of Treatment: 7 Clustered Wound: Yes Photos Wound Measurements Length: (cm) Width: (cm) Depth: (cm) Clustered Quantity: Area: (cm) Volume: (cm) 3 % Reduction in Area: 76.8% 0.5 % Reduction in Volume: 88.4% 0.1 Epithelialization: Medium (34-66%) 2 Tunneling: No 1.178 Undermining: No 0.118 Wound Description Classification: Full Thickness Without Exposed Sup Wound Margin: Distinct, outline attached Exudate Amount: Medium Exudate Type: Serosanguineous Exudate Color: red, brown port Structures Foul Odor After Cleansing: No Slough/Fibrino Yes Wound Bed Granulation Amount: Large (67-100%) Exposed Structure Granulation Quality: Red Fascia Exposed: No Necrotic Amount: Small (1-33%) Fat Layer (Subcutaneous Tissue) Exposed: Yes Necrotic Quality: Adherent Slough Tendon Exposed: No Muscle Exposed: No Joint Exposed: No Bone Exposed: No [address] SOPHIANNA, FOSNAUGH ([id number]) [id number] Page 7 of 8 No Abnormalities Noted: No No Abnormalities Noted: No Scarring: Yes Hemosiderin Staining: Yes Moisture Temperature / Pain No Abnormalities Noted: Yes Temperature: No Abnormality Treatment Notes Wound #1 (Ankle) Wound Laterality: Left, Medial Cleanser Soap and Water Discharge Instruction: May shower and wash wound with dial antibacterial soap and water prior to dressing change. Peri-Wound Care Ketoconazole Cream 2% Discharge Instruction: Apply Ketoconazole as directed Triamcinolone 15 (g) Discharge Instruction: Use triamcinolone 15 (g) as directed Sween Lotion (Moisturizing lotion) Discharge Instruction: Apply moisturizing lotion as directed Topical Primary Dressing Maxorb Extra Ag+ Alginate Dressing, 2x2 (in/in) Discharge Instruction: Apply to wound bed as instructed Secondary Dressing Woven Gauze Sponge, Non-Sterile 4x4 in Discharge Instruction: Apply over primary dressing as  directed. Secured With Compression Wrap Urgo K2 Lite, (equivalent to a 3 layer) two layer compression system, regular Discharge Instruction: Apply Urgo K2 Lite as directed (alternative to 3 layer compression). Netting, #5 Compression Stockings Add-Ons Electronic Signature(s) Signed: 08/11/2022 4:05:21 PM By: Samuella Bruin Entered By: Samuella Bruin on 08/11/2022 08:47:40 -------------------------------------------------------------------------------- Vitals Details Patient Name: Date of Service: [name], DARRICA 08/11/2022 8:30 A M Medical Record Number: [id number] Patient Account Number: [id number] Date of Birth/Sex:  Treating RN: 02-Jul-1955 (67 y.o. [name] Primary Care Alisse Tuite: Elesa Massed Other Clinician: Referring Demya Scruggs: Treating Brysin Towery/Extender: Madaline Guthrie in Treatment: 7 Vital Signs Time Taken: 08:42 Temperature (F): 98.3 Height (in): 67 Pulse (bpm): 97 Weight (lbs): 225 Respiratory Rate (breaths/min): 18 Body Mass Index (BMI): 35.2 Blood Pressure (mmHg): 141/97 Reference Range: 80 - 120 mg / dl Electronic Signature(s) Signed: 08/11/2022 4:05:21 PM By: Kyla Balzarine, [name] ([id number]) [id number] Page 8 of 8 Entered By: Samuella Bruin on 08/11/2022 08:42:12

## 2022-08-11 NOTE — Progress Notes (Signed)
LEXINGTON, ELDRED ([id number]) [id number] Page 1 of 8 Visit Report for 08/11/2022 Chief Complaint Document Details Patient Name: Date of Service: [name] 08/11/2022 8:30 A M Medical Record Number: [id number] Patient Account Number: [id number] Date of Birth/Sex: Treating RN: 10/29/1955 (67 y.o. F) Primary Care Provider: Elesa Massed Other Clinician: Referring Provider: Treating Provider/Extender: Madaline Guthrie in Treatment: 7 Information Obtained from: Patient Chief Complaint Patient presents for treatment of an open ulcer due to venous insufficiency Electronic Signature(s) Signed: 08/11/2022 8:56:10 AM By: Duanne Guess MD FACS Entered By: Duanne Guess on 08/11/2022 08:56:09 -------------------------------------------------------------------------------- Debridement Details Patient Name: Date of Service: [name] 08/11/2022 8:30 A M Medical Record Number: [id number] Patient Account Number: [id number] Date of Birth/Sex: Treating RN: [birth year] (68 y.o. [name] Primary Care Provider: Elesa Massed Other Clinician: Referring Provider: Treating Provider/Extender: Madaline Guthrie in Treatment: 7 Debridement Performed for Assessment: Wound #1 Left,Medial Ankle Performed By: Physician Duanne Guess, MD Debridement Type: Debridement Severity of Tissue Pre Debridement: Fat layer exposed Level of Consciousness (Pre-procedure): Awake and Alert Pre-procedure Verification/Time Out Yes - 08:50 Taken: Start Time: 08:50 Pain Control: Lidocaine 4% Topical Solution Percent of Wound Bed Debrided: 75% T Area Debrided (cm): otal 0.88 Tissue and other material debrided: Non-Viable, Eschar, Slough, Slough Level: Non-Viable Tissue Debridement Description: Selective/Open Wound Instrument: Curette Bleeding: Minimum Hemostasis Achieved: Pressure Response to Treatment: Procedure was tolerated  well Level of Consciousness (Post- Awake and Alert procedure): Post Debridement Measurements of Total Wound Length: (cm) 3 Width: (cm) 0.5 Depth: (cm) 0.1 Volume: (cm) 0.118 Character of Wound/Ulcer Post Debridement: Improved Severity of Tissue Post Debridement: Fat layer exposed Post Procedure Diagnosis MALAYIA, DEBARI ([id number]) [phone].pdf Page 2 of 8 Same as Pre-procedure Notes scribed for Dr. Lady Gary by Samuella Bruin, RN Electronic Signature(s) Signed: 08/11/2022 9:06:34 AM By: Duanne Guess MD FACS Signed: 08/11/2022 4:05:21 PM By: Samuella Bruin Entered By: Samuella Bruin on 08/11/2022 08:53:11 -------------------------------------------------------------------------------- HPI Details Patient Name: Date of Service: [name] 08/11/2022 8:30 A M Medical Record Number: [id number] Patient Account Number: [id number] Date of Birth/Sex: Treating RN: 1955-03-06 (67 y.o. F) Primary Care Provider: Elesa Massed Other Clinician: Referring Provider: Treating Provider/Extender: Madaline Guthrie in Treatment: 7 History of Present Illness HPI Description: ADMISSION This is a 67 year old woman with a history of ulcerative colitis, not currently on any immunosuppressive agents, who first noticed some left ankle swelling and a small wound in October 2023. She was seen in the emergency department and given a course of oral antibiotics. The wound did not really improve. Her primary care doctor attempted to apply an Unna boot, but it sounds as though it was not quite properly applied and it was very painful and she developed significant swelling above the boot. Since that time, she has simply been applying Neosporin to the site. The wound has gotten larger and as result, she was referred to the wound care center for further evaluation and management. She has worn compression stockings in the past, but was not wearing them when  she developed the wound. ABI today was 1.02. She is not diabetic, nor does she smoke. 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 On her left medial ankle, there is an irregular wound consistent with a venous stasis ulcer. There is slough accumulation on the surface. The moisture balance is tending towards dry. She does have hemosiderin deposition on the bilateral ankles along  with 1+ nonpitting edema. 06/25/2022: The ulcer on her left medial ankle is a bit cleaner but still has a fair amount of slough accumulation. There is good granulation tissue emerging. She developed an blister just anterior to this, however, and it has broken open, leaving her with a small superficial ulcer. Edema control is improved. She has not yet heard anything from the vascular clinic regarding her venous reflux studies. 07/02/2022: She has had a fair amount of drainage which is resulted in periwound tissue breakdown. There is erythema and irritation visible. The wound itself is smaller and the area where she had the blister form Last week has actually healed. There is slough on the wound surface with good granulation tissue underneath. She has her venous reflux studies right after this appointment. 07/13/2022: The periwound skin looks better this week. The erythema and irritation has improved. The wound is smaller with just a little bit of slough on the surface. Her venous reflux studies were done and she has significant bilateral venous insufficiency. 07/27/2022: The wound is very nearly healed with just a small open portion under a layer of thin eschar. She did meet with vascular surgery and they are planning saphenous vein ablation once her wound is healed. 08/04/2022: There are just a couple of tiny openings remaining underneath some eschar. Edema control is good. 08/11/2022: There are 2 small openings that remain. They are smaller and more superficial than last week.  Edema control is good. Electronic Signature(s) Signed: 08/11/2022 8:56:45 AM By: Duanne Guess MD FACS Entered By: Duanne Guess on 08/11/2022 08:56:45 -------------------------------------------------------------------------------- Physical Exam Details Patient Name: Date of Service: [name] 08/11/2022 8:30 A M Medical Record Number: [id number] Patient Account Number: [id number] Date of Birth/Sex: Treating RN: 1955-03-02 (67 y.o. F) Primary Care Provider: Elesa Massed Other Clinician: Referring Provider: Treating Provider/Extender: Astrid Drafts [place], [name] ([id number]) [id number] Page 3 of 8 Weeks in Treatment: 7 Constitutional . . . . no acute distress. Respiratory Normal work of breathing on room air. Notes 08/11/2022: There are 2 small openings that remain. They are smaller and more superficial than last week. Edema control is good. Electronic Signature(s) Signed: 08/11/2022 8:57:20 AM By: Duanne Guess MD FACS Entered By: Duanne Guess on 08/11/2022 08:57:20 -------------------------------------------------------------------------------- Physician Orders Details Patient Name: Date of Service: [name] 08/11/2022 8:30 A M Medical Record Number: [id number] Patient Account Number: [id number] Date of Birth/Sex: Treating RN: 1955-12-27 (68 y.o. [name] Primary Care Provider: Elesa Massed Other Clinician: Referring Provider: Treating Provider/Extender: Madaline Guthrie in Treatment: 7 Verbal / Phone Orders: No Diagnosis Coding ICD-10 Coding Code Description [phone] Non-pressure chronic ulcer of left ankle with fat layer exposed I87.2 Venous insufficiency (chronic) (peripheral) K51.90 Ulcerative colitis, unspecified, without complications Follow-up Appointments ppointment in 1 week. - Dr. Lady Gary Room 3 Return A Anesthetic (In clinic) Topical Lidocaine 4% applied to wound  bed Bathing/ Shower/ Hygiene May shower with protection but do not get wound dressing(s) wet. Protect dressing(s) with water repellant cover (for example, large plastic bag) or a cast cover and may then take shower. - Please use cast protector on the left leg when taking a shower or bath. Edema Control - Lymphedema / SCD / Other Left Lower Extremity Elevate legs to the level of the heart or above for 30 minutes daily and/or when sitting for 3-4 times a day throughout the day. A void standing for long periods of time. Patient to wear own compression stockings every day. Exercise regularly If  compression wraps slide down please call wound center and speak with a nurse. Additional Orders / Instructions Wound #1 Left,Medial Ankle Follow Nutritious Diet Wound Treatment Wound #1 - Ankle Wound Laterality: Left, Medial Cleanser: Soap and Water 1 x Per Week/30 Days Discharge Instructions: May shower and wash wound with dial antibacterial soap and water prior to dressing change. Peri-Wound Care: Ketoconazole Cream 2% 1 x Per Week/30 Days Discharge Instructions: Apply Ketoconazole as directed Peri-Wound Care: Triamcinolone 15 (g) 1 x Per Week/30 Days Discharge Instructions: Use triamcinolone 15 (g) as directed HAVA, MILLWEE ([id number]) [phone].pdf Page 4 of 8 Peri-Wound Care: Sween Lotion (Moisturizing lotion) 1 x Per Week/30 Days Discharge Instructions: Apply moisturizing lotion as directed Prim Dressing: Maxorb Extra Ag+ Alginate Dressing, 2x2 (in/in) 1 x Per Week/30 Days ary Discharge Instructions: Apply to wound bed as instructed Secondary Dressing: Woven Gauze Sponge, Non-Sterile 4x4 in 1 x Per Week/30 Days Discharge Instructions: Apply over primary dressing as directed. Compression Wrap: Urgo K2 Lite, (equivalent to a 3 layer) two layer compression system, regular 1 x Per Week/30 Days Discharge Instructions: Apply Urgo K2 Lite as directed (alternative to 3 layer  compression). Compression Wrap: Netting, #5 1 x Per Week/30 Days Patient Medications llergies: No Known Allergies A Notifications Medication Indication Start End 08/11/2022 lidocaine DOSE topical 4 % cream - cream topical Electronic Signature(s) Signed: 08/11/2022 9:06:34 AM By: Duanne Guess MD FACS Entered By: Duanne Guess on 08/11/2022 08:57:34 -------------------------------------------------------------------------------- Problem List Details Patient Name: Date of Service: [name] 08/11/2022 8:30 A M Medical Record Number: [id number] Patient Account Number: [id number] Date of Birth/Sex: Treating RN: 1955/11/20 (66 y.o. F) Primary Care Provider: Elesa Massed Other Clinician: Referring Provider: Treating Provider/Extender: Madaline Guthrie in Treatment: 7 Active Problems ICD-10 Encounter Code Description Active Date MDM Diagnosis L97.322 Non-pressure chronic ulcer of left ankle with fat layer exposed 06/18/2022 No Yes I87.2 Venous insufficiency (chronic) (peripheral) 06/18/2022 No Yes K51.90 Ulcerative colitis, unspecified, without complications 06/18/2022 No Yes Inactive Problems Resolved Problems ICD-10 Code Description Active Date Resolved Date L97.321 Non-pressure chronic ulcer of left ankle limited to breakdown of skin 06/25/2022 06/25/2022 Carney Living ([id number]) [id number] Page 5 of 8 Electronic Signature(s) Signed: 08/11/2022 8:55:46 AM By: Duanne Guess MD FACS Entered By: Duanne Guess on 08/11/2022 08:55:46 -------------------------------------------------------------------------------- Progress Note Details Patient Name: Date of Service: [name] 08/11/2022 8:30 A M Medical Record Number: [id number] Patient Account Number: [id number] Date of Birth/Sex: Treating RN: [birth year] (67 y.o. F) Primary Care Provider: Elesa Massed Other Clinician: Referring Provider: Treating Provider/Extender:  Madaline Guthrie in Treatment: 7 Subjective Chief Complaint Information obtained from Patient Patient presents for treatment of an open ulcer due to venous insufficiency History of Present Illness (HPI) ADMISSION This is a 67 year old woman with a history of ulcerative colitis, not currently on any immunosuppressive agents, who first noticed some left ankle swelling and a small wound in October 2023. She was seen in the emergency department and given a course of oral antibiotics. The wound did not really improve. Her primary care doctor attempted to apply an Unna boot, but it sounds as though it was not quite properly applied and it was very painful and she developed significant swelling above the boot. Since that time, she has simply been applying Neosporin to the site. The wound has gotten larger and as result, she was referred to the wound care center for further evaluation and management. She has worn compression stockings in the past, but was not wearing them  when she developed the wound. ABI today was 1.02. She is not diabetic, nor does she smoke. 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 On her left medial ankle, there is an irregular wound consistent with a venous stasis ulcer. There is slough accumulation on the surface. The moisture balance is tending towards dry. She does have hemosiderin deposition on the bilateral ankles along with 1+ nonpitting edema. 06/25/2022: The ulcer on her left medial ankle is a bit cleaner but still has a fair amount of slough accumulation. There is good granulation tissue emerging. She developed an blister just anterior to this, however, and it has broken open, leaving her with a small superficial ulcer. Edema control is improved. She has not yet heard anything from the vascular clinic regarding her venous reflux studies. 07/02/2022: She has had a fair amount of drainage which is  resulted in periwound tissue breakdown. There is erythema and irritation visible. The wound itself is smaller and the area where she had the blister form Last week has actually healed. There is slough on the wound surface with good granulation tissue underneath. She has her venous reflux studies right after this appointment. 07/13/2022: The periwound skin looks better this week. The erythema and irritation has improved. The wound is smaller with just a little bit of slough on the surface. Her venous reflux studies were done and she has significant bilateral venous insufficiency. 07/27/2022: The wound is very nearly healed with just a small open portion under a layer of thin eschar. She did meet with vascular surgery and they are planning saphenous vein ablation once her wound is healed. 08/04/2022: There are just a couple of tiny openings remaining underneath some eschar. Edema control is good. 08/11/2022: There are 2 small openings that remain. They are smaller and more superficial than last week. Edema control is good. Patient History Information obtained from Patient. Family History Diabetes - Siblings, Heart Disease - Father, Hypertension - Mother, Stroke - Mother, No family history of Cancer, Hereditary Spherocytosis, Kidney Disease, Lung Disease, Seizures, Thyroid Problems, Tuberculosis. Social History Never smoker, Marital Status - Widowed, Alcohol Use - Rarely, Drug Use - No History, Caffeine Use - Daily. Medical History Gastrointestinal Patient has history of Colitis Musculoskeletal Patient has history of Rheumatoid Arthritis MARYL, BRAMLET ([id number]) [id number] Page 6 of 8 Objective Constitutional no acute distress. Vitals Time Taken: 8:42 AM, Height: 67 in, Weight: 225 lbs, BMI: 35.2, Temperature: 98.3 F, Pulse: 97 bpm, Respiratory Rate: 18 breaths/min, Blood Pressure: 141/97 mmHg. Respiratory Normal work of breathing on room air. General Notes:  08/11/2022: There are 2 small openings that remain. They are smaller and more superficial than last week. Edema control is good. Integumentary (Hair, Skin) Wound #1 status is Open. Original cause of wound was Gradually Appeared. The date acquired was: 11/23/2021. The wound has been in treatment 7 weeks. The wound is located on the Left,Medial Ankle. The wound measures 3cm length x 0.5cm width x 0.1cm depth; 1.178cm^2 area and 0.118cm^3 volume. There is Fat Layer (Subcutaneous Tissue) exposed. There is no tunneling or undermining noted. There is a medium amount of serosanguineous drainage noted. The wound margin is distinct with the outline attached to the wound base. There is large (67-100%) red granulation within the wound bed. There is a small (1-33%) amount of necrotic tissue within the wound bed including Adherent Slough. The periwound skin appearance had no abnormalities noted for moisture. The periwound skin  appearance exhibited: Scarring, Hemosiderin Staining. Periwound temperature was noted as No Abnormality. Assessment Active Problems ICD-10 Non-pressure chronic ulcer of left ankle with fat layer exposed Venous insufficiency (chronic) (peripheral) Ulcerative colitis, unspecified, without complications Procedures Wound #1 Pre-procedure diagnosis of Wound #1 is a Venous Leg Ulcer located on the Left,Medial Ankle .Severity of Tissue Pre Debridement is: Fat layer exposed. There was a Selective/Open Wound Non-Viable Tissue Debridement with a total area of 0.88 sq cm performed by Duanne Guess, MD. With the following instrument(s): Curette to remove Non-Viable tissue/material. Material removed includes Eschar and Slough and after achieving pain control using Lidocaine 4% Topical Solution. No specimens were taken. A time out was conducted at 08:50, prior to the start of the procedure. A Minimum amount of bleeding was controlled with Pressure. The procedure was tolerated well. Post Debridement  Measurements: 3cm length x 0.5cm width x 0.1cm depth; 0.118cm^3 volume. Character of Wound/Ulcer Post Debridement is improved. Severity of Tissue Post Debridement is: Fat layer exposed. Post procedure Diagnosis Wound #1: Same as Pre-Procedure General Notes: scribed for Dr. Lady Gary by Samuella Bruin, RN. Pre-procedure diagnosis of Wound #1 is a Venous Leg Ulcer located on the Left,Medial Ankle . There was a Double Layer Compression Therapy Procedure by Samuella Bruin, RN. Post procedure Diagnosis Wound #1: Same as Pre-Procedure Plan Follow-up Appointments: Return Appointment in 1 week. - Dr. Lady Gary Room 3 Anesthetic: (In clinic) Topical Lidocaine 4% applied to wound bed Bathing/ Shower/ Hygiene: May shower with protection but do not get wound dressing(s) wet. Protect dressing(s) with water repellant cover (for example, large plastic bag) or a cast cover and may then take shower. - Please use cast protector on the left leg when taking a shower or bath. Edema Control - Lymphedema / SCD / Other: Elevate legs to the level of the heart or above for 30 minutes daily and/or when sitting for 3-4 times a day throughout the day. Avoid standing for long periods of time. Patient to wear own compression stockings every day. Exercise regularly If compression wraps slide down please call wound center and speak with a nurse. Additional Orders / Instructions: Wound #1 Left,Medial Ankle: Follow Nutritious Diet The following medication(s) was prescribed: lidocaine topical 4 % cream cream topical was prescribed at facility WOUND #1: - Ankle Wound Laterality: Left, Medial Cleanser: Soap and Water 1 x Per Week/30 Days Discharge Instructions: May shower and wash wound with dial antibacterial soap and water prior to dressing change. Peri-Wound Care: Ketoconazole Cream 2% 1 x Per Week/30 Days Discharge Instructions: Apply Ketoconazole as directed Peri-Wound Care: Triamcinolone 15 (g) 1 x Per Week/30  Days KENZA, LEMOS ([id number]) [id number] Page 7 of 8 Discharge Instructions: Use triamcinolone 15 (g) as directed Peri-Wound Care: Sween Lotion (Moisturizing lotion) 1 x Per Week/30 Days Discharge Instructions: Apply moisturizing lotion as directed Prim Dressing: Maxorb Extra Ag+ Alginate Dressing, 2x2 (in/in) 1 x Per Week/30 Days ary Discharge Instructions: Apply to wound bed as instructed Secondary Dressing: Woven Gauze Sponge, Non-Sterile 4x4 in 1 x Per Week/30 Days Discharge Instructions: Apply over primary dressing as directed. Com pression Wrap: Urgo K2 Lite, (equivalent to a 3 layer) two layer compression system, regular 1 x Per Week/30 Days Discharge Instructions: Apply Urgo K2 Lite as directed (alternative to 3 layer compression). Com pression Wrap: Netting, #5 1 x Per Week/30 Days 08/11/2022: There are 2 small openings that remain. They are smaller and more superficial than last week. Edema control is good. I used a curette to debride slough and eschar  from the wound. We will continue silver alginate with 3 layer compression/equivalent. Follow-up in 1 week. I anticipate she will likely be closed in the next 1 to 2 weeks. Electronic Signature(s) Signed: 08/11/2022 8:58:09 AM By: Duanne Guess MD FACS Entered By: Duanne Guess on 08/11/2022 08:58:08 -------------------------------------------------------------------------------- HxROS Details Patient Name: Date of Service: [name] 08/11/2022 8:30 A M Medical Record Number: [id number] Patient Account Number: [id number] Date of Birth/Sex: Treating RN: [birth year] (67 y.o. F) Primary Care Provider: Elesa Massed Other Clinician: Referring Provider: Treating Provider/Extender: Madaline Guthrie in Treatment: 7 Information Obtained From Patient Gastrointestinal Medical History: Positive for: Colitis Musculoskeletal Medical History: Positive for: Rheumatoid  Arthritis Immunizations Pneumococcal Vaccine: Received Pneumococcal Vaccination: Yes Received Pneumococcal Vaccination On or After 60th Birthday: No Tetanus Vaccine: Last tetanus shot: 05/25/2015 Implantable Devices None Family and Social History Cancer: No; Diabetes: Yes - Siblings; Heart Disease: Yes - Father; Hereditary Spherocytosis: No; Hypertension: Yes - Mother; Kidney Disease: No; Lung Disease: No; Seizures: No; Stroke: Yes - Mother; Thyroid Problems: No; Tuberculosis: No; Never smoker; Marital Status - Widowed; Alcohol Use: Rarely; Drug Use: No History; Caffeine Use: Daily; Financial Concerns: No; Food, Clothing or Shelter Needs: No; Support System Lacking: No; Transportation Concerns: No Electronic Signature(s) Signed: 08/11/2022 9:06:34 AM By: Duanne Guess MD FACS Entered By: Duanne Guess on 08/11/2022 08:56:52 Carney Living ([id number]) [id number] Page 8 of 8 -------------------------------------------------------------------------------- SuperBill Details Patient Name: Date of Service: BLAINE, PASQUARELLI 08/11/2022 Medical Record Number: [id number] Patient Account Number: [id number] Date of Birth/Sex: Treating RN: [birth year] (67 y.o. F) Primary Care Provider: Elesa Massed Other Clinician: Referring Provider: Treating Provider/Extender: Madaline Guthrie in Treatment: 7 Diagnosis Coding ICD-10 Codes Code Description [phone] Non-pressure chronic ulcer of left ankle with fat layer exposed I87.2 Venous insufficiency (chronic) (peripheral) K51.90 Ulcerative colitis, unspecified, without complications Facility Procedures : CPT4 Code: [id number] Description: 97597 - DEBRIDE WOUND 1ST 20 SQ CM OR < ICD-10 Diagnosis Description L97.322 Non-pressure chronic ulcer of left ankle with fat layer exposed Modifier: Quantity: 1 Physician Procedures : CPT4 Code Description Modifier [id number] - WC PHYS LEVEL 3 - EST PT 25 ICD-10  Diagnosis Description L97.322 Non-pressure chronic ulcer of left ankle with fat layer exposed I87.2 Venous insufficiency (chronic) (peripheral) Quantity: 1 : [id number] - WC PHYS DEBR WO ANESTH 20 SQ CM ICD-10 Diagnosis Description L97.322 Non-pressure chronic ulcer of left ankle with fat layer exposed Quantity: 1 Electronic Signature(s) Signed: 08/11/2022 8:58:28 AM By: Duanne Guess MD FACS Entered By: Duanne Guess on 08/11/2022 08:58:28

## 2022-08-18 ENCOUNTER — Encounter (HOSPITAL_BASED_OUTPATIENT_CLINIC_OR_DEPARTMENT_OTHER): Payer: Medicare Other | Admitting: General Surgery

## 2022-08-18 DIAGNOSIS — L97322 Non-pressure chronic ulcer of left ankle with fat layer exposed: Secondary | ICD-10-CM | POA: Diagnosis not present

## 2022-08-19 NOTE — Progress Notes (Signed)
[name] ([id number]) [id number] Page 1 of 8 Visit Report for 08/18/2022 Arrival Information Details Patient Name: Date of Service: [name], [name] 08/18/2022 8:00 A M Medical Record Number: [id number] Patient Account Number: [id number] Date of Birth/Sex: Treating RN: [birth year] (67 y.o. [name] Primary Care Gino Garrabrant: Elesa Massed Other Clinician: Referring Bentley Fissel: Treating Zyia Kaneko/Extender: Madaline Guthrie in Treatment: 8 Visit Information History Since Last Visit Added or deleted any medications: No Patient Arrived: Ambulatory Any new allergies or adverse reactions: No Arrival Time: 08:12 Had a fall or experienced change in No Accompanied By: self activities of daily living that may affect Transfer Assistance: None risk of falls: Patient Identification Verified: Yes Signs or symptoms of abuse/neglect since last visito No Patient Requires Transmission-Based Precautions: No Hospitalized since last visit: No Patient Has Alerts: No Implantable device outside of the clinic excluding No cellular tissue based products placed in the center since last visit: Has Dressing in Place as Prescribed: Yes Pain Present Now: No Electronic Signature(s) Signed: 08/19/2022 5:11:52 PM By: Karie Schwalbe RN Entered By: Karie Schwalbe on 08/18/2022 08:12:29 -------------------------------------------------------------------------------- Clinic Level of Care Assessment Details Patient Name: Date of Service: [name] 08/18/2022 8:00 A M Medical Record Number: [id number] Patient Account Number: [id number] Date of Birth/Sex: Treating RN: 08-27-1955 (67 y.o. [name] Primary Care Richar Dunklee: Elesa Massed Other Clinician: Referring Siriyah Ambrosius: Treating Trenia Tennyson/Extender: Madaline Guthrie in Treatment: 8 Clinic Level of Care Assessment Items TOOL 4 Quantity Score X- 1 0 Use when only an EandM is  performed on FOLLOW-UP visit ASSESSMENTS - Nursing Assessment / Reassessment X- 1 10 Reassessment of Co-morbidities (includes updates in patient status) X- 1 5 Reassessment of Adherence to Treatment Plan ASSESSMENTS - Wound and Skin A ssessment / Reassessment X - Simple Wound Assessment / Reassessment - one wound 1 5 []  - 0 Complex Wound Assessment / Reassessment - multiple wounds []  - 0 Dermatologic / Skin Assessment (not related to wound area) ASSESSMENTS - Focused Assessment []  - 0 Circumferential Edema Measurements - multi extremities []  - 0 Nutritional Assessment / Counseling / Intervention [name], [name] ([id number]) [id number] Page 2 of 8 []  - 0 Lower Extremity Assessment (monofilament, tuning fork, pulses) []  - 0 Peripheral Arterial Disease Assessment (using hand held doppler) ASSESSMENTS - Ostomy and/or Continence Assessment and Care []  - 0 Incontinence Assessment and Management []  - 0 Ostomy Care Assessment and Management (repouching, etc.) PROCESS - Coordination of Care X - Simple Patient / Family Education for ongoing care 1 15 []  - 0 Complex (extensive) Patient / Family Education for ongoing care X- 1 10 Staff obtains Chiropractor, Records, T Results / Process Orders est X- 1 10 Staff telephones HHA, Nursing Homes / Clarify orders / etc []  - 0 Routine Transfer to another Facility (non-emergent condition) []  - 0 Routine Hospital Admission (non-emergent condition) []  - 0 New Admissions / Manufacturing engineer / Ordering NPWT Apligraf, etc. , []  - 0 Emergency Hospital Admission (emergent condition) X- 1 10 Simple Discharge Coordination []  - 0 Complex (extensive) Discharge Coordination PROCESS - Special Needs []  - 0 Pediatric / Minor Patient Management []  - 0 Isolation Patient Management []  - 0 Hearing / Language / Visual special needs []  - 0 Assessment of Community assistance (transportation, D/C planning, etc.) []  -  0 Additional assistance / Altered mentation []  - 0 Support Surface(s) Assessment (bed, cushion, seat, etc.) INTERVENTIONS - Wound Cleansing / Measurement X - Simple Wound Cleansing - one wound 1 5 []  - 0  Complex Wound Cleansing - multiple wounds X- 1 5 Wound Imaging (photographs - any number of wounds) []  - 0 Wound Tracing (instead of photographs) X- 1 5 Simple Wound Measurement - one wound []  - 0 Complex Wound Measurement - multiple wounds INTERVENTIONS - Wound Dressings []  - 0 Small Wound Dressing one or multiple wounds []  - 0 Medium Wound Dressing one or multiple wounds []  - 0 Large Wound Dressing one or multiple wounds []  - 0 Application of Medications - topical []  - 0 Application of Medications - injection INTERVENTIONS - Miscellaneous []  - 0 External ear exam []  - 0 Specimen Collection (cultures, biopsies, blood, body fluids, etc.) []  - 0 Specimen(s) / Culture(s) sent or taken to Lab for analysis []  - 0 Patient Transfer (multiple staff / Nurse, adult / Similar devices) []  - 0 Simple Staple / Suture removal (25 or less) []  - 0 Complex Staple / Suture removal (26 or more) []  - 0 Hypo / Hyperglycemic Management (close monitor of Blood Glucose) [name] ([id number]) [id number] Page 3 of 8 []  - 0 Ankle / Brachial Index (ABI) - do not check if billed separately X- 1 5 Vital Signs Has the patient been seen at the hospital within the last three years: Yes Total Score: 85 Level Of Care: New/Established - Level 3 Electronic Signature(s) Signed: 08/19/2022 5:11:52 PM By: Karie Schwalbe RN Entered By: Karie Schwalbe on 08/18/2022 08:39:17 -------------------------------------------------------------------------------- Encounter Discharge Information Details Patient Name: Date of Service: [name] 08/18/2022 8:00 A M Medical Record Number: [id number] Patient Account Number: [id number] Date of Birth/Sex: Treating RN: Aug 01, 1955 (67  y.o. [name] Primary Care Makelle Marrone: Elesa Massed Other Clinician: Referring Joaovictor Krone: Treating Brenner Visconti/Extender: Madaline Guthrie in Treatment: 8 Encounter Discharge Information Items Discharge Condition: Stable Ambulatory Status: Ambulatory Discharge Destination: Home Transportation: Private Auto Accompanied By: self Schedule Follow-up Appointment: Yes Clinical Summary of Care: Patient Declined Electronic Signature(s) Signed: 08/19/2022 5:11:52 PM By: Karie Schwalbe RN Entered By: Karie Schwalbe on 08/18/2022 17:23:35 -------------------------------------------------------------------------------- Lower Extremity Assessment Details Patient Name: Date of Service: [name], [name] 08/18/2022 8:00 A M Medical Record Number: [id number] Patient Account Number: [id number] Date of Birth/Sex: Treating RN: January 14, 1956 (66 y.o. [name] Primary Care Loveta Dellis: Elesa Massed Other Clinician: Referring Rebecca Cairns: Treating Viviana Trimble/Extender: Madaline Guthrie in Treatment: 8 Edema Assessment Assessed: [Left: No] [Right: No] Edema: [Left: Ye] [Right: s] Calf Left: Right: Point of Measurement: From Medial Instep 38.2 cm Ankle Left: Right: Point of Measurement: From Medial Instep 26 cm Vascular Assessment BRYCELYNN, STAMPLEY ([id number]) [Right:[id number] Page 4 of 8] Pulses: Dorsalis Pedis Palpable: [Left:Yes] Electronic Signature(s) Signed: 08/19/2022 5:11:52 PM By: Karie Schwalbe RN Entered By: Karie Schwalbe on 08/18/2022 08:20:23 -------------------------------------------------------------------------------- Multi Wound Chart Details Patient Name: Date of Service: [name] 08/18/2022 8:00 A M Medical Record Number: [id number] Patient Account Number: [id number] Date of Birth/Sex: Treating RN: 12/23/1955 (67 y.o. F) Primary Care Anissia Wessells: Elesa Massed Other Clinician: Referring  Kimoni Pagliarulo: Treating Myrka Sylva/Extender: Madaline Guthrie in Treatment: 8 Vital Signs Height(in): 67 Pulse(bpm): 85 Weight(lbs): 225 Blood Pressure(mmHg): 143/90 Body Mass Index(BMI): 35.2 Temperature(F): 97.7 Respiratory Rate(breaths/min): 18 [1:Photos:] [N/A:N/A] Left, Medial Ankle N/A N/A Wound Location: Gradually Appeared N/A N/A Wounding Event: Venous Leg Ulcer N/A N/A Primary Etiology: Colitis, Rheumatoid Arthritis N/A N/A Comorbid History: 11/23/2021 N/A N/A Date Acquired: 8 N/A N/A Weeks of Treatment: Healed - Epithelialized N/A N/A Wound Status: No N/A N/A Wound Recurrence: Yes N/A N/A Clustered Wound: 2 N/A N/A Clustered  Quantity: 0x0x0 N/A N/A Measurements L x W x D (cm) 0 N/A N/A A (cm) : rea 0 N/A N/A Volume (cm) : 100.00% N/A N/A % Reduction in A rea: 100.00% N/A N/A % Reduction in Volume: Full Thickness Without Exposed N/A N/A Classification: Support Structures None Present N/A N/A Exudate Amount: Distinct, outline attached N/A N/A Wound Margin: None Present (0%) N/A N/A Granulation Amount: None Present (0%) N/A N/A Necrotic Amount: Fascia: No N/A N/A Exposed Structures: Fat Layer (Subcutaneous Tissue): No Tendon: No Muscle: No Joint: No Bone: No Large (67-100%) N/A N/A Epithelialization: Scarring: Yes N/A N/A Periwound Skin Texture: Maceration: No N/A N/A Periwound Skin Moisture: Dry/Scaly: No Hemosiderin Staining: Yes N/A N/A Periwound Skin Color: No Abnormality N/A N/A TemperatureMONROE, [name] ([id number]) [id number] Page 5 of 8 Treatment Notes Electronic Signature(s) Signed: 08/18/2022 8:27:54 AM By: Duanne Guess MD FACS Entered By: Duanne Guess on 08/18/2022 08:27:54 -------------------------------------------------------------------------------- Multi-Disciplinary Care Plan Details Patient Name: Date of Service: [name] 08/18/2022 8:00 A M Medical Record  Number: [id number] Patient Account Number: [id number] Date of Birth/Sex: Treating RN: [birth year] (67 y.o. [name] Primary Care Timesha Cervantez: Elesa Massed Other Clinician: Referring Kanya Potteiger: Treating Brighten Orndoff/Extender: Madaline Guthrie in Treatment: 8 Multidisciplinary Care Plan reviewed with physician Active Inactive Electronic Signature(s) Signed: 08/19/2022 5:11:52 PM By: Karie Schwalbe RN Entered By: Karie Schwalbe on 08/18/2022 08:37:57 -------------------------------------------------------------------------------- Pain Assessment Details Patient Name: Date of Service: [name], [name] 08/18/2022 8:00 A M Medical Record Number: [id number] Patient Account Number: [id number] Date of Birth/Sex: Treating RN: [birth year] (67 y.o. [name] Primary Care Chatara Lucente: Elesa Massed Other Clinician: Referring Leotha Westermeyer: Treating Zitlaly Malson/Extender: Madaline Guthrie in Treatment: 8 Active Problems Location of Pain Severity and Description of Pain Patient Has Paino No Site Locations Pain Management and Medication [name], [name] ([id number]) [id number] Page 6 of 8 Current Pain Management: Electronic Signature(s) Signed: 08/19/2022 5:11:52 PM By: Karie Schwalbe RN Entered By: Karie Schwalbe on 08/18/2022 08:13:15 -------------------------------------------------------------------------------- Patient/Caregiver Education Details Patient Name: Date of Service: [name] 6/25/2024andnbsp8:00 A M Medical Record Number: [id number] Patient Account Number: [id number] Date of Birth/Gender: Treating RN: [birth year] (67 y.o. [name] Primary Care Physician: Elesa Massed Other Clinician: Referring Physician: Treating Physician/Extender: Madaline Guthrie in Treatment: 8 Education Assessment Education Provided To: Patient Education Topics Provided Wound/Skin Impairment: Methods:  Explain/Verbal Responses: Return demonstration correctly Electronic Signature(s) Signed: 08/19/2022 5:11:52 PM By: Karie Schwalbe RN Entered By: Karie Schwalbe on 08/18/2022 08:38:25 -------------------------------------------------------------------------------- Wound Assessment Details Patient Name: Date of Service: [name] 08/18/2022 8:00 A M Medical Record Number: [id number] Patient Account Number: [id number] Date of Birth/Sex: Treating RN: Jun 10, 1955 (67 y.o. [name] Primary Care Oaklyn Jakubek: Elesa Massed Other Clinician: Referring Gorman Safi: Treating Glendia Olshefski/Extender: Madaline Guthrie in Treatment: 8 Wound Status Wound Number: 1 Primary Etiology: Venous Leg Ulcer Wound Location: Left, Medial Ankle Wound Status: Healed - Epithelialized Wounding Event: Gradually Appeared Comorbid History: Colitis, Rheumatoid Arthritis Date Acquired: 11/23/2021 Weeks Of Treatment: 8 Clustered Wound: Yes Photos [place], [name] ([id number]) [id number] Page 7 of 8 Wound Measurements Length: (cm) Width: (cm) Depth: (cm) Clustered Quantity: Area: (cm) Volume: (cm) 0 % Reduction in Area: 100% 0 % Reduction in Volume: 100% 0 Epithelialization: Large (67-100%) 2 Tunneling: No 0 Undermining: No 0 Wound Description Classification: Full Thickness Without Exposed Sup Wound Margin: Distinct, outline attached Exudate Amount: None Present port Structures Foul Odor After Cleansing: No Slough/Fibrino No Wound Bed Granulation Amount: None Present (0%) Exposed  Structure Necrotic Amount: None Present (0%) Fascia Exposed: No Fat Layer (Subcutaneous Tissue) Exposed: No Tendon Exposed: No Muscle Exposed: No Joint Exposed: No Bone Exposed: No Periwound Skin Texture Texture Color No Abnormalities Noted: Yes No Abnormalities Noted: Yes Moisture Temperature / Pain No Abnormalities Noted: Yes Temperature: No Abnormality Electronic  Signature(s) Signed: 08/19/2022 5:11:52 PM By: Karie Schwalbe RN Entered By: Karie Schwalbe on 08/18/2022 08:25:14 -------------------------------------------------------------------------------- Vitals Details Patient Name: Date of Service: [name], [name] 08/18/2022 8:00 A M Medical Record Number: [id number] Patient Account Number: [id number] Date of Birth/Sex: Treating RN: Jul 01, 1955 (67 y.o. [name] Primary Care Aimee Heldman: Elesa Massed Other Clinician: Referring Lanessa Shill: Treating Kazuki Ingle/Extender: Madaline Guthrie in Treatment: 8 Vital Signs Time Taken: 08:12 Temperature (F): 97.7 Height (in): 67 Pulse (bpm): 85 Weight (lbs): 225 Respiratory Rate (breaths/min): 18 Body Mass Index (BMI): 35.2 Blood Pressure (mmHg): 143/90 Reference Range: 80 - 120 mg / dl Electronic Signature(s) Signed: 08/19/2022 5:11:52 PM By: Karie Schwalbe RN [name], [name] ([id number]) [id number] Page 8 of 8 Entered By: Karie Schwalbe on 08/18/2022 08:18:44

## 2022-08-19 NOTE — Progress Notes (Signed)
MAHEEN, CWIKLA ([id number]) [id number] Page 1 of 6 Visit Report for 08/18/2022 Chief Complaint Document Details Patient Name: Date of Service: [name], [name] 08/18/2022 8:00 A M Medical Record Number: [id number] Patient Account Number: [id number] Date of Birth/Sex: Treating RN: Mar 15, 1955 (67 y.o. F) Primary Care Provider: Elesa Massed Other Clinician: Referring Provider: Treating Provider/Extender: Madaline Guthrie in Treatment: 8 Information Obtained from: Patient Chief Complaint Patient presents for treatment of an open ulcer due to venous insufficiency Electronic Signature(s) Signed: 08/18/2022 8:28:00 AM By: Duanne Guess MD FACS Entered By: Duanne Guess on 08/18/2022 08:28:00 -------------------------------------------------------------------------------- HPI Details Patient Name: Date of Service: [name] 08/18/2022 8:00 A M Medical Record Number: [id number] Patient Account Number: [id number] Date of Birth/Sex: Treating RN: May 11, 1955 (67 y.o. F) Primary Care Provider: Elesa Massed Other Clinician: Referring Provider: Treating Provider/Extender: Madaline Guthrie in Treatment: 8 History of Present Illness HPI Description: ADMISSION This is a 67 year old woman with a history of ulcerative colitis, not currently on any immunosuppressive agents, who first noticed some left ankle swelling and a small wound in October 2023. She was seen in the emergency department and given a course of oral antibiotics. The wound did not really improve. Her primary care doctor attempted to apply an Unna boot, but it sounds as though it was not quite properly applied and it was very painful and she developed significant swelling above the boot. Since that time, she has simply been applying Neosporin to the site. The wound has gotten larger and as result, she was referred to the wound care center for further evaluation and  management. She has worn compression stockings in the past, but was not wearing them when she developed the wound. ABI today was 1.02. She is not diabetic, nor does she smoke. 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 On her left medial ankle, there is an irregular wound consistent with a venous stasis ulcer. There is slough accumulation on the surface. The moisture balance is tending towards dry. She does have hemosiderin deposition on the bilateral ankles along with 1+ nonpitting edema. 06/25/2022: The ulcer on her left medial ankle is a bit cleaner but still has a fair amount of slough accumulation. There is good granulation tissue emerging. She developed an blister just anterior to this, however, and it has broken open, leaving her with a small superficial ulcer. Edema control is improved. She has not yet heard anything from the vascular clinic regarding her venous reflux studies. 07/02/2022: She has had a fair amount of drainage which is resulted in periwound tissue breakdown. There is erythema and irritation visible. The wound itself is smaller and the area where she had the blister form Last week has actually healed. There is slough on the wound surface with good granulation tissue underneath. She has her venous reflux studies right after this appointment. 07/13/2022: The periwound skin looks better this week. The erythema and irritation has improved. The wound is smaller with just a little bit of slough on the surface. Her venous reflux studies were done and she has significant bilateral venous insufficiency. 07/27/2022: The wound is very nearly healed with just a small open portion under a layer of thin eschar. She did meet with vascular surgery and they are planning saphenous vein ablation once her wound is healed. 08/04/2022: There are just a couple of tiny openings remaining underneath some eschar. Edema control is good. 08/11/2022:  There  are 2 small openings that remain. They are smaller and more superficial than last week. Edema control is good. 08/18/2022: Underneath a layer of eschar, her wound is healed. [name], [name] ([id number]) [id number] Page 2 of 6 Electronic Signature(s) Signed: 08/18/2022 8:28:18 AM By: Duanne Guess MD FACS Entered By: Duanne Guess on 08/18/2022 08:28:18 -------------------------------------------------------------------------------- Physical Exam Details Patient Name: Date of Service: [name], [name] 08/18/2022 8:00 A M Medical Record Number: [id number] Patient Account Number: [id number] Date of Birth/Sex: Treating RN: 31-Aug-1955 (67 y.o. F) Primary Care Provider: Elesa Massed Other Clinician: Referring Provider: Treating Provider/Extender: Madaline Guthrie in Treatment: 8 Constitutional Slightly hypertensive. . . . no acute distress. Respiratory Normal work of breathing on room air. Notes 08/18/2022: Underneath a thin layer of eschar, her wound is completely healed. Electronic Signature(s) Signed: 08/18/2022 8:29:06 AM By: Duanne Guess MD FACS Entered By: Duanne Guess on 08/18/2022 08:29:06 -------------------------------------------------------------------------------- Physician Orders Details Patient Name: Date of Service: [name] 08/18/2022 8:00 A M Medical Record Number: [id number] Patient Account Number: [id number] Date of Birth/Sex: Treating RN: [birth year] (67 y.o. [name] Primary Care Provider: Elesa Massed Other Clinician: Referring Provider: Treating Provider/Extender: Madaline Guthrie in Treatment: 8 Verbal / Phone Orders: No Diagnosis Coding ICD-10 Coding Code Description [phone] Non-pressure chronic ulcer of left ankle with fat layer exposed I87.2 Venous insufficiency (chronic) (peripheral) K51.90 Ulcerative colitis, unspecified, without complications Discharge  From [facility] Services Discharge from Wound Care Center - Congratulations! Electronic Signature(s) Signed: 08/18/2022 8:29:20 AM By: Duanne Guess MD FACS Entered By: Duanne Guess on 08/18/2022 08:29:19 Carney Living ([id number]) [id number] Page 3 of 6 -------------------------------------------------------------------------------- Problem List Details Patient Name: Date of Service: [name], [name] 08/18/2022 8:00 A M Medical Record Number: [id number] Patient Account Number: [id number] Date of Birth/Sex: Treating RN: [birth year] (67 y.o. F) Primary Care Provider: Elesa Massed Other Clinician: Referring Provider: Treating Provider/Extender: Madaline Guthrie in Treatment: 8 Active Problems ICD-10 Encounter Code Description Active Date MDM Diagnosis L97.322 Non-pressure chronic ulcer of left ankle with fat layer exposed 06/18/2022 No Yes I87.2 Venous insufficiency (chronic) (peripheral) 06/18/2022 No Yes K51.90 Ulcerative colitis, unspecified, without complications 06/18/2022 No Yes Inactive Problems Resolved Problems ICD-10 Code Description Active Date Resolved Date L97.321 Non-pressure chronic ulcer of left ankle limited to breakdown of skin 06/25/2022 06/25/2022 Electronic Signature(s) Signed: 08/18/2022 8:27:47 AM By: Duanne Guess MD FACS Entered By: Duanne Guess on 08/18/2022 08:27:47 -------------------------------------------------------------------------------- Progress Note Details Patient Name: Date of Service: [name] 08/18/2022 8:00 A M Medical Record Number: [id number] Patient Account Number: [id number] Date of Birth/Sex: Treating RN: 05-Jan-1956 (67 y.o. F) Primary Care Provider: Elesa Massed Other Clinician: Referring Provider: Treating Provider/Extender: Madaline Guthrie in Treatment: 8 Subjective Chief Complaint Information obtained from Patient Patient presents for treatment of an  open ulcer due to venous insufficiency History of Present Illness (HPI) ADMISSION This is a 67 year old woman with a history of ulcerative colitis, not currently on any immunosuppressive agents, who first noticed some left ankle swelling and a small wound in October 2023. She was seen in the emergency department and given a course of oral antibiotics. The wound did not really improve. Her [name], [name] ([id number]) [id number] Page 4 of 6 primary care doctor attempted to apply an Unna boot, but it sounds as though it was not quite properly applied and it was very painful and she developed significant swelling above the boot. Since that time, she has simply been applying Neosporin to the site. The  wound has gotten larger and as result, she was referred to the wound care center for further evaluation and management. She has worn compression stockings in the past, but was not wearing them when she developed the wound. ABI today was 1.02. She is not diabetic, nor does she smoke. 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 On her left medial ankle, there is an irregular wound consistent with a venous stasis ulcer. There is slough accumulation on the surface. The moisture balance is tending towards dry. She does have hemosiderin deposition on the bilateral ankles along with 1+ nonpitting edema. 06/25/2022: The ulcer on her left medial ankle is a bit cleaner but still has a fair amount of slough accumulation. There is good granulation tissue emerging. She developed an blister just anterior to this, however, and it has broken open, leaving her with a small superficial ulcer. Edema control is improved. She has not yet heard anything from the vascular clinic regarding her venous reflux studies. 07/02/2022: She has had a fair amount of drainage which is resulted in periwound tissue breakdown. There is erythema and irritation  visible. The wound itself is smaller and the area where she had the blister form Last week has actually healed. There is slough on the wound surface with good granulation tissue underneath. She has her venous reflux studies right after this appointment. 07/13/2022: The periwound skin looks better this week. The erythema and irritation has improved. The wound is smaller with just a little bit of slough on the surface. Her venous reflux studies were done and she has significant bilateral venous insufficiency. 07/27/2022: The wound is very nearly healed with just a small open portion under a layer of thin eschar. She did meet with vascular surgery and they are planning saphenous vein ablation once her wound is healed. 08/04/2022: There are just a couple of tiny openings remaining underneath some eschar. Edema control is good. 08/11/2022: There are 2 small openings that remain. They are smaller and more superficial than last week. Edema control is good. 08/18/2022: Underneath a layer of eschar, her wound is healed. Patient History Information obtained from Patient. Family History Diabetes - Siblings, Heart Disease - Father, Hypertension - Mother, Stroke - Mother, No family history of Cancer, Hereditary Spherocytosis, Kidney Disease, Lung Disease, Seizures, Thyroid Problems, Tuberculosis. Social History Never smoker, Marital Status - Widowed, Alcohol Use - Rarely, Drug Use - No History, Caffeine Use - Daily. Medical History Gastrointestinal Patient has history of Colitis Musculoskeletal Patient has history of Rheumatoid Arthritis Objective Constitutional Slightly hypertensive. no acute distress. Vitals Time Taken: 8:12 AM, Height: 67 in, Weight: 225 lbs, BMI: 35.2, Temperature: 97.7 F, Pulse: 85 bpm, Respiratory Rate: 18 breaths/min, Blood Pressure: 143/90 mmHg. Respiratory Normal work of breathing on room air. General Notes: 08/18/2022: Underneath a thin layer of eschar, her wound is completely  healed. Integumentary (Hair, Skin) Wound #1 status is Healed - Epithelialized. Original cause of wound was Gradually Appeared. The date acquired was: 11/23/2021. The wound has been in treatment 8 weeks. The wound is located on the Left,Medial Ankle. The wound measures 0cm length x 0cm width x 0cm depth; 0cm^2 area and 0cm^3 volume. There is no tunneling or undermining noted. There is a none present amount of drainage noted. The wound margin is distinct with the outline attached to the wound base. There is no granulation within the wound bed. There is no necrotic tissue within the wound  bed. The periwound skin appearance had no abnormalities noted for texture. The periwound skin appearance had no abnormalities noted for moisture. The periwound skin appearance had no abnormalities noted for color. Periwound temperature was noted as No Abnormality. Assessment Active Problems ICD-10 Non-pressure chronic ulcer of left ankle with fat layer exposed Venous insufficiency (chronic) (peripheral) Ulcerative colitis, unspecified, without complications [name], [name] ([id number]) [id number] Page 5 of 6 Plan Discharge From [facility] Services: Discharge from Wound Care Center - Congratulations! 08/18/2022: Under a thin layer of eschar, her wound is healed. The eschar was removed. She does have compression stockings which she wears regularly. She has an appointment coming up with vascular surgery next month for saphenous vein ablation. We will discharge her from the wound care center. She may follow-up as needed. Electronic Signature(s) Signed: 08/18/2022 8:30:26 AM By: Duanne Guess MD FACS Entered By: Duanne Guess on 08/18/2022 08:30:26 -------------------------------------------------------------------------------- HxROS Details Patient Name: Date of Service: [name] 08/18/2022 8:00 A M Medical Record Number: [id number] Patient Account Number: [id number] Date of Birth/Sex:  Treating RN: 1956/01/19 (67 y.o. F) Primary Care Provider: Elesa Massed Other Clinician: Referring Provider: Treating Provider/Extender: Madaline Guthrie in Treatment: 8 Information Obtained From Patient Gastrointestinal Medical History: Positive for: Colitis Musculoskeletal Medical History: Positive for: Rheumatoid Arthritis Immunizations Pneumococcal Vaccine: Received Pneumococcal Vaccination: Yes Received Pneumococcal Vaccination On or After 60th Birthday: No Tetanus Vaccine: Last tetanus shot: 05/25/2015 Implantable Devices None Family and Social History Cancer: No; Diabetes: Yes - Siblings; Heart Disease: Yes - Father; Hereditary Spherocytosis: No; Hypertension: Yes - Mother; Kidney Disease: No; Lung Disease: No; Seizures: No; Stroke: Yes - Mother; Thyroid Problems: No; Tuberculosis: No; Never smoker; Marital Status - Widowed; Alcohol Use: Rarely; Drug Use: No History; Caffeine Use: Daily; Financial Concerns: No; Food, Clothing or Shelter Needs: No; Support System Lacking: No; Transportation Concerns: No Electronic Signature(s) Signed: 08/18/2022 9:24:19 AM By: Duanne Guess MD FACS Entered By: Duanne Guess on 08/18/2022 08:28:30 Carney Living ([id number]) [id number] Page 6 of 6 -------------------------------------------------------------------------------- SuperBill Details Patient Name: Date of Service: [name], [name] 08/18/2022 Medical Record Number: [id number] Patient Account Number: [id number] Date of Birth/Sex: Treating RN: 1955-09-23 (67 y.o. F) Primary Care Provider: Elesa Massed Other Clinician: Referring Provider: Treating Provider/Extender: Madaline Guthrie in Treatment: 8 Diagnosis Coding ICD-10 Codes Code Description [phone] Non-pressure chronic ulcer of left ankle with fat layer exposed I87.2 Venous insufficiency (chronic) (peripheral) K51.90 Ulcerative colitis, unspecified,  without complications Facility Procedures : CPT4 Code: [id number] Description: 99213 - WOUND CARE VISIT-LEV 3 EST PT Modifier: Quantity: 1 Physician Procedures : CPT4 Code Description Modifier [id number] - WC PHYS LEVEL 3 - EST PT ICD-10 Diagnosis Description L97.322 Non-pressure chronic ulcer of left ankle with fat layer exposed I87.2 Venous insufficiency (chronic) (peripheral) Quantity: 1 Electronic Signature(s) Signed: 08/18/2022 9:24:19 AM By: Duanne Guess MD FACS Signed: 08/19/2022 5:11:52 PM By: Karie Schwalbe RN Previous Signature: 08/18/2022 8:32:26 AM Version By: Duanne Guess MD FACS Entered By: Karie Schwalbe on 08/18/2022 08:39:29

## 2022-09-10 ENCOUNTER — Ambulatory Visit: Payer: Medicare Other | Admitting: Podiatry

## 2022-09-15 ENCOUNTER — Other Ambulatory Visit: Payer: Self-pay | Admitting: Adult Health

## 2022-09-15 DIAGNOSIS — M8588 Other specified disorders of bone density and structure, other site: Secondary | ICD-10-CM

## 2022-09-16 ENCOUNTER — Ambulatory Visit: Payer: Medicare Other | Admitting: Vascular Surgery

## 2022-09-16 ENCOUNTER — Encounter: Payer: Self-pay | Admitting: Vascular Surgery

## 2022-09-16 ENCOUNTER — Ambulatory Visit (HOSPITAL_COMMUNITY)
Admission: RE | Admit: 2022-09-16 | Discharge: 2022-09-16 | Disposition: A | Discharge status: Home / Self Care | Payer: Medicare Other | Source: Ambulatory Visit | Attending: Vascular Surgery | Admitting: Vascular Surgery

## 2022-09-16 VITALS — BP 131/86 | HR 73 | Temp 97.6°F | Resp 14 | Ht 65.0 in | Wt 234.0 lb

## 2022-09-16 DIAGNOSIS — I70248 Atherosclerosis of native arteries of left leg with ulceration of other part of lower left leg: Secondary | ICD-10-CM | POA: Insufficient documentation

## 2022-09-16 DIAGNOSIS — I872 Venous insufficiency (chronic) (peripheral): Secondary | ICD-10-CM

## 2022-09-16 LAB — VAS US ABI WITH/WO TBI
Left ABI: 0.95
Right ABI: 0.97

## 2022-09-16 NOTE — Progress Notes (Signed)
REASON FOR VISIT:   Follow-up of venous stasis ulcer.  MEDICAL ISSUES:   CHRONIC VENOUS INSUFFICIENCY: This patient has C5 venous disease.  Her venous ulcer of the left leg is now healed.  She is in knee-high compression stockings with a gradient of 20 to 30 mmHg.  This will help prevent ulcer recurrence.  She also has a leg elevation device which she uses routinely.  I reassured her that her arterial Doppler study today showed that she has excellent perfusion bilaterally.  On the left side, which is the site of concern, she has a biphasic posterior tibial signal with a triphasic dorsalis pedis signal.  ABIs 95%.  Toe pressures 104 mmHg.  This all suggest adequate circulation for healing.  Thus I do not think she needs arteriography especially given that the wound has now healed.  She had a short segment of reflux in the proximal great saphenous vein and anterior accessory saphenous vein and I do not think addressing this would significantly impact her venous hypertension.  However this may progress in the future.  If her symptoms progress or she develops recurrent ulceration she would require follow-up testing.  Her last study was in May of this year.   HPI:   [name] is a pleasant 67 y.o. female who I last saw on 07/22/2022 with a venous stasis ulcer of her left leg.  At that time she was making excellent progress at the wound care center and the wound had almost healed.  The wound originally developed in October 2023.  On my exam she had biphasic signals in the dorsalis pedis artery and a monophasic signal in the posterior tibial artery on the left.  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  She comes in for a 6-week follow-up visit and for formal arterial Doppler testing.  I felt that if there were any concerns for healing she would need an arteriogram.  We also discussed conservative measures including continued use of the [place] boot until the wound  had healed completely.  After that she will need a knee-high compression stocking with a gradient of 20 to 30 mmHg.  Of note her previous venous duplex scan on 07/02/2022 showed some reflux in the proximal great saphenous vein and also in the anterior accessory saphenous vein.  Each of these veins were about 5-1/2 mm in diameter.  Since I saw her last, she states that the wound on her left leg has completely healed and she is no longer requiring the [employer].  She has a small Band-Aid on the medial malleolus she says just where the skin is irritated.  She has been elevating her legs and wearing her compression stockings with a gradient of 20 to 30 mmHg.  She denies any claudication or rest pain.  Past Medical History:  Diagnosis Date   Arthritis    C. difficile diarrhea    Heart attack (HCC) 10/24/2008   HLD (hyperlipidemia)    HTN (hypertension)    UC (ulcerative colitis) (HCC)     Family History  Problem Relation Age of Onset   Stroke Mother    Congestive Heart Failure Father    Obesity Sister    Hypertension Sister    Diabetes Brother    Thyroid disease Daughter    Colon cancer Neg Hx    Stomach cancer Neg Hx     SOCIAL HISTORY: Social History   Tobacco Use   Smoking status: Never  Smokeless tobacco: Never  Substance Use Topics   Alcohol use: Not Currently    Allergies  Allergen Reactions   Prednisone Other (See Comments)    Tachycardia, nervous, sweating , jitters    Current Outpatient Medications  Medication Sig Dispense Refill   acetaminophen (TYLENOL) 500 MG tablet Take 500 mg by mouth every 6 (six) hours as needed (For arthritis pain.).      Calcium Carb-Cholecalciferol (CALCIUM-VITAMIN D) 600-400 MG-UNIT TABS Take 1 tablet by mouth daily.     dicyclomine (BENTYL) 10 MG capsule Take 1 capsule (10 mg total) by mouth every 8 (eight) hours as needed for spasms. 90 capsule 0   hydrocortisone (ANUSOL-HC) 25 MG suppository Place 1 suppository (25 mg total) rectally 2  (two) times daily. 60 suppository 0   metoprolol succinate (TOPROL-XL) 50 MG 24 hr tablet Take 50 mg by mouth daily. Take with or immediately following a meal.     Multiple Vitamin (MULTIVITAMIN) tablet Take 1 tablet by mouth daily.     ondansetron (ZOFRAN) 4 MG tablet Take 1 tablet (4 mg total) by mouth every 8 (eight) hours as needed for nausea or vomiting. 30 tablet 1   ondansetron (ZOFRAN-ODT) 4 MG disintegrating tablet Take 1 tablet (4 mg total) by mouth every 8 (eight) hours as needed for nausea or vomiting. 20 tablet 0   ondansetron (ZOFRAN-ODT) 4 MG disintegrating tablet Take 1 tablet (4 mg total) by mouth every 8 (eight) hours as needed for nausea or vomiting. 12 tablet 1   predniSONE (DELTASONE) 20 MG tablet Take 1-2 tablets (20-40 mg total) by mouth daily with breakfast. Take 2 tablets (40mg ) daily x 14 days, then 1.5 tablets(30mg ) daily x 14 days, then 1 tablets (20mg ) daily 90 tablet 0   Adalimumab (HUMIRA PEN) 40 MG/0.4ML PNKT Inject 40 mg into the skin every 14 (fourteen) days. After starter kit. (Patient not taking: Reported on 09/16/2022) 2 each 11   Adalimumab (HUMIRA PEN-CD/UC/HS STARTER) 40 MG/0.8ML PNKT Inject 40 mg into the skin as directed. Needs starter pack: Inject 160 mg [place] on day 1, 80 mg [place] on day 15, then maintenance dose. (Patient not taking: Reported on 09/16/2022) 1 each 0   cephALEXin (KEFLEX) 500 MG capsule Take 1 capsule (500 mg total) by mouth 4 (four) times daily. (Patient not taking: Reported on 07/22/2022) 28 capsule 0   cephALEXin (KEFLEX) 500 MG capsule Take 1 capsule (500 mg total) by mouth 4 (four) times daily. (Patient not taking: Reported on 07/22/2022) 28 capsule 0   oxyCODONE-acetaminophen (PERCOCET/ROXICET) 5-325 MG tablet Take 1 tablet by mouth every 6 (six) hours as needed for severe pain. (Patient not taking: Reported on 09/16/2022) 15 tablet 0   tamsulosin (FLOMAX) 0.4 MG CAPS capsule Take 1 capsule (0.4 mg total) by mouth daily. (Patient not taking:  Reported on 09/16/2022) 30 capsule 0   No current facility-administered medications for this visit.    REVIEW OF SYSTEMS:  [X]  denotes positive finding, [ ]  denotes negative finding Cardiac  Comments:  Chest pain or chest pressure:    Shortness of breath upon exertion:    Short of breath when lying flat:    Irregular heart rhythm:        Vascular    Pain in calf, thigh, or hip brought on by ambulation:    Pain in feet at night that wakes you up from your sleep:     Blood clot in your veins:    Leg swelling:  Pulmonary    Oxygen at home:    Productive cough:     Wheezing:         Neurologic    Sudden weakness in arms or legs:     Sudden numbness in arms or legs:     Sudden onset of difficulty speaking or slurred speech:    Temporary loss of vision in one eye:     Problems with dizziness:         Gastrointestinal    Blood in stool:     Vomited blood:         Genitourinary    Burning when urinating:     Blood in urine:        Psychiatric    Major depression:         Hematologic    Bleeding problems:    Problems with blood clotting too easily:        Skin    Rashes or ulcers:        Constitutional    Fever or chills:     PHYSICAL EXAM:   Vitals:   09/16/22 1341  BP: 131/86  Pulse: 73  Resp: 14  Temp: 97.6 F (36.4 C)  TempSrc: Temporal  SpO2: 98%  Weight: 234 lb (106.1 kg)  Height: 5\' 5"  (1.651 m)    GENERAL: The patient is a well-nourished female, in no acute distress. The vital signs are documented above. CARDIAC: There is a regular rate and rhythm.  VASCULAR: I do not detect carotid bruits. I cannot palpate pedal pulses. The wound on the left leg has healed.  She does have hyperpigmentation at the medial malleolus.  PULMONARY: There is good air exchange bilaterally without wheezing or rales. ABDOMEN: Soft and non-tender with normal pitched bowel sounds.  MUSCULOSKELETAL: There are no major deformities or cyanosis. NEUROLOGIC: No focal  weakness or paresthesias are detected. SKIN: There are no ulcers or rashes noted. PSYCHIATRIC: The patient has a normal affect.  DATA:    ARTERIAL DOPPLER STUDY: I have independently interpreted her arterial Doppler study today.  On the right side there is a monophasic posterior tibial signal with a triphasic dorsalis pedis signal.  ABIs 97%.  Toe pressures 101 mmHg.  On the left side there is a biphasic posterior tibial signal with a triphasic dorsalis pedis signal.  ABIs 95%.  Toe pressures 104 mmHg.  Waverly Ferrari [facility] [phone]

## 2022-10-06 ENCOUNTER — Ambulatory Visit: Payer: Medicare Other | Admitting: Podiatry

## 2022-10-20 ENCOUNTER — Ambulatory Visit: Payer: Medicare Other | Admitting: Podiatry

## 2022-10-29 ENCOUNTER — Ambulatory Visit: Payer: Medicare Other | Admitting: Podiatry

## 2022-11-17 ENCOUNTER — Ambulatory Visit: Payer: Medicare Other | Admitting: Podiatry

## 2022-11-24 ENCOUNTER — Encounter: Payer: Self-pay | Admitting: Podiatry

## 2022-11-24 ENCOUNTER — Ambulatory Visit: Payer: Medicare Other | Admitting: Podiatry

## 2022-11-24 DIAGNOSIS — M778 Other enthesopathies, not elsewhere classified: Secondary | ICD-10-CM | POA: Diagnosis not present

## 2022-11-24 NOTE — Progress Notes (Signed)
She presents today for follow-up of her capsulitis bilateral foot.  Wound care has finished working with her she is completely closed a medial ankle ulceration due to venous insufficiency.  She denies fever chills nausea vomit muscle aches and pains.  States that her feet are 100% improved and she is able to walk.  Objective: Vital signs are stable oriented x 3 there is no erythematous mild edema no cellulitis drainage or odor no pain on palpation or range of motion of the metatarsophalangeal joint second bilaterally.  Rigid hammertoe deformities hallux valgus form is noted with pes planovalgus.  Assessment: Well-healing capsulitis second metatarsophalangeal joint bilateral.  Plan: Follow-up with her on an as-needed basis.

## 2023-03-20 ENCOUNTER — Other Ambulatory Visit: Payer: Self-pay

## 2023-03-20 ENCOUNTER — Emergency Department (HOSPITAL_COMMUNITY): Payer: Medicare Other

## 2023-03-20 ENCOUNTER — Encounter (HOSPITAL_COMMUNITY): Payer: Self-pay

## 2023-03-20 ENCOUNTER — Emergency Department (HOSPITAL_COMMUNITY)
Admission: EM | Admit: 2023-03-20 | Discharge: 2023-03-20 | Disposition: A | Discharge status: Home / Self Care | Payer: Medicare Other | Attending: Emergency Medicine | Admitting: Emergency Medicine

## 2023-03-20 DIAGNOSIS — I10 Essential (primary) hypertension: Secondary | ICD-10-CM | POA: Diagnosis not present

## 2023-03-20 DIAGNOSIS — Z79899 Other long term (current) drug therapy: Secondary | ICD-10-CM | POA: Diagnosis not present

## 2023-03-20 DIAGNOSIS — R519 Headache, unspecified: Secondary | ICD-10-CM | POA: Diagnosis present

## 2023-03-20 LAB — CBC WITH DIFFERENTIAL/PLATELET
Abs Immature Granulocytes: 0.02 10*3/uL (ref 0.00–0.07)
Basophils Absolute: 0 10*3/uL (ref 0.0–0.1)
Basophils Relative: 1 %
Eosinophils Absolute: 0.3 10*3/uL (ref 0.0–0.5)
Eosinophils Relative: 3 %
HCT: 41.4 % (ref 36.0–46.0)
Hemoglobin: 13.3 g/dL (ref 12.0–15.0)
Immature Granulocytes: 0 %
Lymphocytes Relative: 29 %
Lymphs Abs: 2.4 10*3/uL (ref 0.7–4.0)
MCH: 30.4 pg (ref 26.0–34.0)
MCHC: 32.1 g/dL (ref 30.0–36.0)
MCV: 94.7 fL (ref 80.0–100.0)
Monocytes Absolute: 0.8 10*3/uL (ref 0.1–1.0)
Monocytes Relative: 10 %
Neutro Abs: 4.6 10*3/uL (ref 1.7–7.7)
Neutrophils Relative %: 57 %
Platelets: 227 10*3/uL (ref 150–400)
RBC: 4.37 MIL/uL (ref 3.87–5.11)
RDW: 13.7 % (ref 11.5–15.5)
WBC: 8.2 10*3/uL (ref 4.0–10.5)
nRBC: 0 % (ref 0.0–0.2)

## 2023-03-20 LAB — COMPREHENSIVE METABOLIC PANEL
ALT: 19 U/L (ref 0–44)
AST: 26 U/L (ref 15–41)
Albumin: 4.2 g/dL (ref 3.5–5.0)
Alkaline Phosphatase: 101 U/L (ref 38–126)
Anion gap: 7 (ref 5–15)
BUN: 15 mg/dL (ref 8–23)
CO2: 25 mmol/L (ref 22–32)
Calcium: 9.9 mg/dL (ref 8.9–10.3)
Chloride: 106 mmol/L (ref 98–111)
Creatinine, Ser: 0.46 mg/dL (ref 0.44–1.00)
GFR, Estimated: >60 mL/min (ref >60)
Glucose, Bld: 88 mg/dL (ref 70–99)
Potassium: 4.2 mmol/L (ref 3.5–5.1)
Sodium: 138 mmol/L (ref 135–145)
Total Bilirubin: 0.7 mg/dL (ref 0.0–1.2)
Total Protein: 8.3 g/dL — ABNORMAL HIGH (ref 6.5–8.1)

## 2023-03-20 LAB — TROPONIN I (HIGH SENSITIVITY): Troponin I (High Sensitivity): 5 ng/L (ref <18)

## 2023-03-20 MED ORDER — HYDROCHLOROTHIAZIDE 25 MG PO TABS: 25.0000 mg | ORAL_TABLET | Freq: Every day | ORAL | 1 refills | Status: DC

## 2023-03-20 MED ORDER — HYDROCHLOROTHIAZIDE 25 MG PO TABS: 25.0000 mg | ORAL_TABLET | Freq: Every day | ORAL | 1 refills | Status: AC

## 2023-03-20 NOTE — ED Triage Notes (Signed)
High blood pressure for the last week, pt is no longer prescribed BP medication, pt was taken off 8 years ago. C/o headache and balance being off. Denies weakness or other sx

## 2023-03-20 NOTE — ED Provider Notes (Signed)
[place] EMERGENCY DEPARTMENT AT [facility] Provider Note   CSN: [id number] Arrival date & time: 03/20/23  1135     History  Chief Complaint  Patient presents with   Headache   Hypertension    [name] is a 68 y.o. female.   Headache Hypertension Associated symptoms include headaches.  68 year old female history of hypertension, hyperlipidemia, ulcerative colitis presenting for headache and hypertension.  Symptoms started a few days ago.  She gradual onset headache which is predominantly left-sided.  No fevers or chills or neck stiffness.  Headache has been somewhat intermittent, no nausea or vomiting.  No vision changes or weakness or numbness.  She felt over the last couple days occasionally she felt some off balance but has not had any falls.  No double vision.  She noted her blood pressures been high for couple weeks usually in the 160s.  Today was in the 200s and she came in for evaluation.  She took some Tylenol for she came in her headache is essentially resolved at this point.  She feels well.  She does not feel off balance or weak or numb.  She has no other symptoms.  Has not had any chest pain or shortness of breath.  She is otherwise been at her baseline health.  Used to take blood pressure medication but stopped it several years ago.     Home Medications Prior to Admission medications   Medication Sig Start Date End Date Taking? Authorizing Provider  hydrochlorothiazide (HYDRODIURIL) 25 MG tablet Take 1 tablet (25 mg total) by mouth daily. 03/20/23  Yes Laurence Spates, MD  acetaminophen (TYLENOL) 500 MG tablet Take 500 mg by mouth every 6 (six) hours as needed (For arthritis pain.).     [provider]  Adalimumab (HUMIRA PEN) 40 MG/0.4ML PNKT Inject 40 mg into the skin every 14 (fourteen) days. After starter kit. Patient not taking: Reported on 09/16/2022 11/12/17   Sherrilyn Rist, MD  Adalimumab [facility] PEN-CD/UC/HS STARTER) 40 MG/0.8ML  PNKT Inject 40 mg into the skin as directed. Needs starter pack: Inject 160 mg [place] on day 1, 80 mg [place] on day 15, then maintenance dose. Patient not taking: Reported on 09/16/2022 11/18/17   Sherrilyn Rist, MD  Calcium Carb-Cholecalciferol (CALCIUM-VITAMIN D) 600-400 MG-UNIT TABS Take 1 tablet by mouth daily.    [provider]  cephALEXin (KEFLEX) 500 MG capsule Take 1 capsule (500 mg total) by mouth 4 (four) times daily. Patient not taking: Reported on 07/22/2022 12/13/21   Prosperi, Christian H, PA-C  cephALEXin (KEFLEX) 500 MG capsule Take 1 capsule (500 mg total) by mouth 4 (four) times daily. Patient not taking: Reported on 07/22/2022 12/13/21   Prosperi, Christian H, PA-C  dicyclomine (BENTYL) 10 MG capsule Take 1 capsule (10 mg total) by mouth every 8 (eight) hours as needed for spasms. 10/08/17   Sherrilyn Rist, MD  hydrocortisone (ANUSOL-HC) 25 MG suppository Place 1 suppository (25 mg total) rectally 2 (two) times daily. 10/18/17   Rodolph Bong, MD  metoprolol succinate (TOPROL-XL) 50 MG 24 hr tablet Take 50 mg by mouth daily. Take with or immediately following a meal.    [provider]  Multiple Vitamin (MULTIVITAMIN) tablet Take 1 tablet by mouth daily.    [provider]  ondansetron (ZOFRAN) 4 MG tablet Take 1 tablet (4 mg total) by mouth every 8 (eight) hours as needed for nausea or vomiting. 10/08/17   Amada Jupiter  L III, MD  ondansetron (ZOFRAN-ODT) 4 MG disintegrating tablet Take 1 tablet (4 mg total) by mouth every 8 (eight) hours as needed for nausea or vomiting. 04/28/22   Terrilee Files, MD  ondansetron (ZOFRAN-ODT) 4 MG disintegrating tablet Take 1 tablet (4 mg total) by mouth every 8 (eight) hours as needed for nausea or vomiting. 04/30/22   Vanetta Mulders, MD  oxyCODONE-acetaminophen (PERCOCET/ROXICET) 5-325 MG tablet Take 1 tablet by mouth every 6 (six) hours as needed for severe pain. Patient not taking: Reported on 09/16/2022 04/28/22    Terrilee Files, MD  predniSONE (DELTASONE) 20 MG tablet Take 1-2 tablets (20-40 mg total) by mouth daily with breakfast. Take 2 tablets (40mg ) daily x 14 days, then 1.5 tablets(30mg ) daily x 14 days, then 1 tablets (20mg ) daily 10/19/17   Rodolph Bong, MD  tamsulosin (FLOMAX) 0.4 MG CAPS capsule Take 1 capsule (0.4 mg total) by mouth daily. Patient not taking: Reported on 09/16/2022 04/28/22   Terrilee Files, MD      Allergies    Prednisone    Review of Systems   Review of Systems  Neurological:  Positive for headaches.  Review of systems completed and notable as per HPI.  ROS otherwise negative.   Physical Exam Updated Vital Signs BP (!) 158/109   Pulse 80   Temp 97.7 F (36.5 C) (Oral)   Resp 16   Ht 5\' 5"  (1.651 m)   Wt 108.9 kg   SpO2 100%   BMI 39.94 kg/m  Physical Exam Vitals and nursing note reviewed.  Constitutional:      General: She is not in acute distress.    Appearance: She is well-developed.  HENT:     Head: Normocephalic and atraumatic.     Nose: Nose normal.     Mouth/Throat:     Mouth: Mucous membranes are moist.     Pharynx: Oropharynx is clear.  Eyes:     Extraocular Movements: Extraocular movements intact.     Conjunctiva/sclera: Conjunctivae normal.     Pupils: Pupils are equal, round, and reactive to light.  Cardiovascular:     Rate and Rhythm: Normal rate and regular rhythm.     Pulses: Normal pulses.     Heart sounds: Normal heart sounds. No murmur heard. Pulmonary:     Effort: Pulmonary effort is normal. No respiratory distress.     Breath sounds: Normal breath sounds.  Abdominal:     Palpations: Abdomen is soft.     Tenderness: There is no abdominal tenderness.  Musculoskeletal:        General: No swelling.     Cervical back: Normal range of motion and neck supple. No rigidity or tenderness.     Right lower leg: No edema.     Left lower leg: No edema.  Skin:    General: Skin is warm and dry.     Capillary Refill: Capillary  refill takes less than 2 seconds.  Neurological:     General: No focal deficit present.     Mental Status: She is alert and oriented to person, place, and time. Mental status is at baseline.     Cranial Nerves: No cranial nerve deficit.     Sensory: No sensory deficit.     Motor: No weakness.     Coordination: Coordination normal.     Gait: Gait normal.     Deep Tendon Reflexes: Reflexes normal.     Comments: Patient is awake and alert.  Oriented person,  place, time, situation.  Cranial nerves are intact.  Normal speech.  Face symmetric.  No visual field cuts.  Extraocular movements are intact.  Normal finger-to-nose bilaterally.  Ambulates without difficulty.  No neglect.  Psychiatric:        Mood and Affect: Mood normal.     ED Results / Procedures / Treatments   Labs (all labs ordered are listed, but only abnormal results are displayed) Labs Reviewed  COMPREHENSIVE METABOLIC PANEL - Abnormal; Notable for the following components:      Result Value   Total Protein 8.3 (*)    All other components within normal limits  CBC WITH DIFFERENTIAL/PLATELET  TROPONIN I (HIGH SENSITIVITY)    EKG EKG Interpretation Date/Time:  Saturday March 20 2023 14:53:09 EST Ventricular Rate:  77 PR Interval:  208 QRS Duration:  94 QT Interval:  418 QTC Calculation: 474 R Axis:   39  Text Interpretation: Age not entered, assumed to be  68 years old for purpose of ECG interpretation Sinus rhythm Borderline prolonged PR interval Confirmed by Fulton Reek [phone]) on 03/20/2023 3:01:08 PM  Radiology CT Head Wo Contrast Result Date: 03/20/2023 CLINICAL DATA:  Worsening headache.  Hypertension. EXAM: CT HEAD WITHOUT CONTRAST TECHNIQUE: Contiguous axial images were obtained from the base of the skull through the vertex without intravenous contrast. RADIATION DOSE REDUCTION: This exam was performed according to the departmental dose-optimization program which includes automated exposure control,  adjustment of the mA and/or kV according to patient size and/or use of iterative reconstruction technique. COMPARISON:  None Available. FINDINGS: Brain: No evidence of intracranial hemorrhage, acute infarction, hydrocephalus, extra-axial collection, or mass lesion/mass effect. Vascular:  No hyperdense vessel or other acute findings. Skull: No evidence of fracture or other significant bone abnormality. Sinuses/Orbits:  No acute findings. Other: None. IMPRESSION: Negative. Electronically Signed   By: Danae Orleans M.D.   On: 03/20/2023 14:00    Procedures Procedures    Medications Ordered in ED Medications - No data to display  ED Course/ Medical Decision Making/ A&P                                 Medical Decision Making Amount and/or Complexity of Data Reviewed Labs: ordered.   Medical Decision Making:   LACRECIA DELVAL is a 68 y.o. female who presented to the ED today with hypertension, headache.  Vital signs notable for hypertension.  Headache was not sudden onset and low suspicion for subarachnoid hemorrhage.  She is no meningismus or fever not consistent with CNS infection.  She is completely normal neurologic exam.  She felt somewhat unsteady over the last couple days intermittently but this has completely resolved with her headache.  She has no signs or symptoms of CVA.  She already had a CT head which was unremarkable.  I have low suspicion for pseudotumor cerebri, venous sinus thrombosis or other serious pathology.  I wonder if her headache may have been related to her worsening hypertension.  Here she is on the 150s and 160s.  She already had lab work done which is reassuring.  Normal renal function, troponin unremarkable, EKG without ischemia.  She had no chest pain.  No signs of endorgan damage or hypertensive emergency.  I think we can safely start her on medication for blood pressure and have her follow-up closely with her PCP.  She did not want any additional medication for headache  here given she is feeling  sniffily better.  Recommend Tylenol, Motrin at home.  Strict return precautions given.  Recommend she follow-up call your doctor Monday and keep track of her blood pressure.  She is comfortable with this plan.   Reviewed and confirmed nursing documentation for past medical history, family history, social history.  Patient's presentation is most consistent with acute complicated illness / injury requiring diagnostic workup.           Final Clinical Impression(s) / ED Diagnoses Final diagnoses:  Primary hypertension    Rx / DC Orders ED Discharge Orders          Ordered    hydrochlorothiazide (HYDRODIURIL) 25 MG tablet  Daily        03/20/23 1507              Laurence Spates, MD 03/20/23 1507

## 2023-03-20 NOTE — Discharge Instructions (Signed)
You were seen today for headache and high blood pressure.  Your CT scan and lab work were reassuring.  I am starting you on a medication for your blood pressure.  You should keep track of her blood pressure and call your doctor on Monday to schedule follow-up.  If you develop severe headache, weakness, numbness, vision changes or any other new concerning symptoms you should return to the ED.

## 2023-03-21 ENCOUNTER — Emergency Department (HOSPITAL_COMMUNITY): Payer: Medicare Other

## 2023-03-21 ENCOUNTER — Encounter (HOSPITAL_COMMUNITY): Payer: Self-pay

## 2023-03-21 ENCOUNTER — Other Ambulatory Visit: Payer: Self-pay

## 2023-03-21 ENCOUNTER — Emergency Department (HOSPITAL_COMMUNITY)
Admission: EM | Admit: 2023-03-21 | Discharge: 2023-03-22 | Disposition: A | Discharge status: Home / Self Care | Payer: Medicare Other | Attending: Emergency Medicine | Admitting: Emergency Medicine

## 2023-03-21 DIAGNOSIS — I10 Essential (primary) hypertension: Secondary | ICD-10-CM | POA: Diagnosis not present

## 2023-03-21 DIAGNOSIS — R519 Headache, unspecified: Secondary | ICD-10-CM

## 2023-03-21 DIAGNOSIS — Z79899 Other long term (current) drug therapy: Secondary | ICD-10-CM | POA: Diagnosis not present

## 2023-03-21 LAB — CBC WITH DIFFERENTIAL/PLATELET
Abs Immature Granulocytes: 0.01 10*3/uL (ref 0.00–0.07)
Basophils Absolute: 0 10*3/uL (ref 0.0–0.1)
Basophils Relative: 0 %
Eosinophils Absolute: 0.2 10*3/uL (ref 0.0–0.5)
Eosinophils Relative: 3 %
HCT: 44.4 % (ref 36.0–46.0)
Hemoglobin: 14.5 g/dL (ref 12.0–15.0)
Immature Granulocytes: 0 %
Lymphocytes Relative: 29 %
Lymphs Abs: 2 10*3/uL (ref 0.7–4.0)
MCH: 30.9 pg (ref 26.0–34.0)
MCHC: 32.7 g/dL (ref 30.0–36.0)
MCV: 94.5 fL (ref 80.0–100.0)
Monocytes Absolute: 0.7 10*3/uL (ref 0.1–1.0)
Monocytes Relative: 10 %
Neutro Abs: 3.9 10*3/uL (ref 1.7–7.7)
Neutrophils Relative %: 58 %
Platelets: 272 10*3/uL (ref 150–400)
RBC: 4.7 MIL/uL (ref 3.87–5.11)
RDW: 14 % (ref 11.5–15.5)
WBC: 6.8 10*3/uL (ref 4.0–10.5)
nRBC: 0 % (ref 0.0–0.2)

## 2023-03-21 LAB — BASIC METABOLIC PANEL
Anion gap: 8 (ref 5–15)
BUN: 14 mg/dL (ref 8–23)
CO2: 23 mmol/L (ref 22–32)
Calcium: 9.3 mg/dL (ref 8.9–10.3)
Chloride: 108 mmol/L (ref 98–111)
Creatinine, Ser: 0.56 mg/dL (ref 0.44–1.00)
GFR, Estimated: >60 mL/min (ref >60)
Glucose, Bld: 95 mg/dL (ref 70–99)
Potassium: 3.7 mmol/L (ref 3.5–5.1)
Sodium: 139 mmol/L (ref 135–145)

## 2023-03-21 LAB — RAPID URINE DRUG SCREEN, HOSP PERFORMED
Amphetamines: NOT DETECTED
Barbiturates: NOT DETECTED
Benzodiazepines: NOT DETECTED
Cocaine: NOT DETECTED
Opiates: NOT DETECTED
Tetrahydrocannabinol: NOT DETECTED

## 2023-03-21 LAB — URINALYSIS, ROUTINE W REFLEX MICROSCOPIC
Bacteria, UA: NONE SEEN
Bilirubin Urine: NEGATIVE
Glucose, UA: NEGATIVE mg/dL
Hgb urine dipstick: NEGATIVE
Ketones, ur: NEGATIVE mg/dL
Nitrite: NEGATIVE
Protein, ur: NEGATIVE mg/dL
Specific Gravity, Urine: 1.02 (ref 1.005–1.030)
pH: 5 (ref 5.0–8.0)

## 2023-03-21 MED ORDER — SODIUM CHLORIDE (PF) 0.9 % IJ SOLN
INTRAMUSCULAR | Status: AC
  Filled 2023-03-21: qty 50

## 2023-03-21 MED ORDER — GADOBUTROL 1 MMOL/ML IV SOLN
10.0000 mL | Freq: Once | INTRAVENOUS | Status: AC | PRN
  Administered 2023-03-21: 10 mL via INTRAVENOUS

## 2023-03-21 MED ORDER — METOCLOPRAMIDE HCL 5 MG/ML IJ SOLN
10.0000 mg | Freq: Once | INTRAMUSCULAR | Status: AC
  Administered 2023-03-21: 10 mg via INTRAVENOUS
  Filled 2023-03-21: qty 2

## 2023-03-21 MED ORDER — IOHEXOL 350 MG/ML SOLN
75.0000 mL | Freq: Once | INTRAVENOUS | Status: AC | PRN
  Administered 2023-03-21: 75 mL via INTRAVENOUS

## 2023-03-21 MED ORDER — LORAZEPAM 1 MG PO TABS
1.0000 mg | ORAL_TABLET | Freq: Once | ORAL | Status: AC
  Administered 2023-03-21: 1 mg via ORAL
  Filled 2023-03-21: qty 1

## 2023-03-21 MED ORDER — SODIUM CHLORIDE 0.9 % IV BOLUS
500.0000 mL | Freq: Once | INTRAVENOUS | Status: AC
  Administered 2023-03-21: 500 mL via INTRAVENOUS

## 2023-03-21 NOTE — ED Provider Notes (Signed)
[place] EMERGENCY DEPARTMENT AT [facility] Provider Note   CSN: [id number] Arrival date & time: 03/21/23  1445     History  Chief Complaint  Patient presents with   Hypertension    [name] is a 68 y.o. female.   Hypertension  68 year old female history of hypertension, hyperlipidemia, ulcerative colitis presenting for headache.  I evaluated this patient yesterday in the emergency department.  At that point she had a couple days of gradual onset headache that was left-sided.  She was hypertensive, as was started on hydrochlorothiazide.  She had CT head yesterday which was unremarkable.  When she left the ED yesterday she was feeling great.  Patient states she started hydrochlorothiazide yesterday and was doing okay.  This morning her blood pressure was in the 150s and she felt good.  Around 2 PM though she had development of headache relatively quickly while she was sitting at home.  It is over the top of her head now and quite severe.  She took some Tylenol and took hydrochlorothiazide again.  When she got the headache she checked her blood pressure and it was 230/116.  She feels that the headache is improving.  No neck stiffness or fever or chills.  No chest pain or shortness of breath.  No vision changes or weakness or numbness.     Home Medications Prior to Admission medications   Medication Sig Start Date End Date Taking? Authorizing Provider  acetaminophen (TYLENOL) 500 MG tablet Take 500 mg by mouth every 6 (six) hours as needed (For arthritis pain.).     [provider]  Adalimumab (HUMIRA PEN) 40 MG/0.4ML PNKT Inject 40 mg into the skin every 14 (fourteen) days. After starter kit. Patient not taking: Reported on 09/16/2022 11/12/17   Sherrilyn Rist, MD  Adalimumab [facility] PEN-CD/UC/HS STARTER) 40 MG/0.8ML PNKT Inject 40 mg into the skin as directed. Needs starter pack: Inject 160 mg [place] on day 1, 80 mg [place] on day 15, then maintenance  dose. Patient not taking: Reported on 09/16/2022 11/18/17   Sherrilyn Rist, MD  Calcium Carb-Cholecalciferol (CALCIUM-VITAMIN D) 600-400 MG-UNIT TABS Take 1 tablet by mouth daily.    [provider]  cephALEXin (KEFLEX) 500 MG capsule Take 1 capsule (500 mg total) by mouth 4 (four) times daily. Patient not taking: Reported on 07/22/2022 12/13/21   Prosperi, Christian H, PA-C  cephALEXin (KEFLEX) 500 MG capsule Take 1 capsule (500 mg total) by mouth 4 (four) times daily. Patient not taking: Reported on 07/22/2022 12/13/21   Prosperi, Christian H, PA-C  dicyclomine (BENTYL) 10 MG capsule Take 1 capsule (10 mg total) by mouth every 8 (eight) hours as needed for spasms. 10/08/17   Sherrilyn Rist, MD  hydrochlorothiazide (HYDRODIURIL) 25 MG tablet Take 1 tablet (25 mg total) by mouth daily. 03/20/23   Laurence Spates, MD  hydrocortisone (ANUSOL-HC) 25 MG suppository Place 1 suppository (25 mg total) rectally 2 (two) times daily. 10/18/17   Rodolph Bong, MD  metoprolol succinate (TOPROL-XL) 50 MG 24 hr tablet Take 50 mg by mouth daily. Take with or immediately following a meal.    [provider]  Multiple Vitamin (MULTIVITAMIN) tablet Take 1 tablet by mouth daily.    [provider]  ondansetron (ZOFRAN) 4 MG tablet Take 1 tablet (4 mg total) by mouth every 8 (eight) hours as needed for nausea or vomiting. 10/08/17   Danis, Andreas Blower, MD  ondansetron (ZOFRAN-ODT) 4  MG disintegrating tablet Take 1 tablet (4 mg total) by mouth every 8 (eight) hours as needed for nausea or vomiting. 04/28/22   Terrilee Files, MD  ondansetron (ZOFRAN-ODT) 4 MG disintegrating tablet Take 1 tablet (4 mg total) by mouth every 8 (eight) hours as needed for nausea or vomiting. 04/30/22   Vanetta Mulders, MD  oxyCODONE-acetaminophen (PERCOCET/ROXICET) 5-325 MG tablet Take 1 tablet by mouth every 6 (six) hours as needed for severe pain. Patient not taking: Reported on 09/16/2022 04/28/22   Terrilee Files, MD  predniSONE (DELTASONE) 20 MG tablet Take 1-2 tablets (20-40 mg total) by mouth daily with breakfast. Take 2 tablets (40mg ) daily x 14 days, then 1.5 tablets(30mg ) daily x 14 days, then 1 tablets (20mg ) daily 10/19/17   Rodolph Bong, MD  tamsulosin (FLOMAX) 0.4 MG CAPS capsule Take 1 capsule (0.4 mg total) by mouth daily. Patient not taking: Reported on 09/16/2022 04/28/22   Terrilee Files, MD      Allergies    Prednisone    Review of Systems   Review of Systems Review of systems completed and notable as per HPI.  ROS otherwise negative.   Physical Exam Updated Vital Signs BP (!) 164/100 (BP Location: Right Arm)   Pulse 94   Temp 98.1 F (36.7 C) (Oral)   Resp 19   Ht 5\' 5"  (1.651 m)   Wt 108.9 kg   SpO2 99%   BMI 39.95 kg/m  Physical Exam Vitals and nursing note reviewed.  Constitutional:      General: She is not in acute distress.    Appearance: She is well-developed.  HENT:     Head: Normocephalic and atraumatic.     Mouth/Throat:     Mouth: Mucous membranes are moist.     Pharynx: Oropharynx is clear.  Eyes:     Extraocular Movements: Extraocular movements intact.     Conjunctiva/sclera: Conjunctivae normal.     Pupils: Pupils are equal, round, and reactive to light.  Cardiovascular:     Rate and Rhythm: Normal rate and regular rhythm.     Pulses: Normal pulses.     Heart sounds: Normal heart sounds. No murmur heard. Pulmonary:     Effort: Pulmonary effort is normal. No respiratory distress.     Breath sounds: Normal breath sounds.  Abdominal:     Palpations: Abdomen is soft.     Tenderness: There is no abdominal tenderness.  Musculoskeletal:        General: No swelling.     Cervical back: Neck supple.  Skin:    General: Skin is warm and dry.     Capillary Refill: Capillary refill takes less than 2 seconds.  Neurological:     General: No focal deficit present.     Mental Status: She is alert and oriented to person, place, and time.  Mental status is at baseline.     Cranial Nerves: No cranial nerve deficit.     Sensory: No sensory deficit.     Motor: No weakness.     Coordination: Coordination normal.     Gait: Gait normal.  Psychiatric:        Mood and Affect: Mood normal.     ED Results / Procedures / Treatments   Labs (all labs ordered are listed, but only abnormal results are displayed) Labs Reviewed  URINALYSIS, ROUTINE W REFLEX MICROSCOPIC - Abnormal; Notable for the following components:      Result Value   Leukocytes,Ua LARGE (*)  All other components within normal limits  CBC WITH DIFFERENTIAL/PLATELET  RAPID URINE DRUG SCREEN, HOSP PERFORMED  BASIC METABOLIC PANEL  BASIC METABOLIC PANEL    EKG EKG Interpretation Date/Time:  Sunday March 21 2023 17:29:18 EST Ventricular Rate:  84 PR Interval:  177 QRS Duration:  91 QT Interval:  388 QTC Calculation: 459 R Axis:   40  Text Interpretation: Sinus rhythm Multiple premature complexes, vent & supraven Confirmed by Fulton Reek [phone]) on 03/21/2023 5:34:17 PM  Radiology MR Brain W and Wo Contrast Result Date: 03/21/2023 CLINICAL DATA:  Headache, increasing frequency or severity EXAM: MRI HEAD WITHOUT AND WITH CONTRAST TECHNIQUE: Multiplanar, multiecho pulse sequences of the brain and surrounding structures were obtained without and with intravenous contrast. CONTRAST:  10mL GADAVIST GADOBUTROL 1 MMOL/ML IV SOLN COMPARISON:  CT head 03/20/2023. FINDINGS: Brain: No acute infarction, hemorrhage, hydrocephalus, extra-axial collection or mass lesion. No pathologic enhancement. Vascular: Major arterial flow voids are maintained skull base. Skull and upper cervical spine: Normal marrow signal. Sinuses/Orbits: Left maxillary sinus retention cyst. Remaining sinuses are mostly clear. Other: No mastoid effusions. IMPRESSION: No evidence of acute intracranial abnormality. Electronically Signed   By: Feliberto Harts M.D.   On: 03/21/2023 19:31   CT Head Wo  Contrast Result Date: 03/20/2023 CLINICAL DATA:  Worsening headache.  Hypertension. EXAM: CT HEAD WITHOUT CONTRAST TECHNIQUE: Contiguous axial images were obtained from the base of the skull through the vertex without intravenous contrast. RADIATION DOSE REDUCTION: This exam was performed according to the departmental dose-optimization program which includes automated exposure control, adjustment of the mA and/or kV according to patient size and/or use of iterative reconstruction technique. COMPARISON:  None Available. FINDINGS: Brain: No evidence of intracranial hemorrhage, acute infarction, hydrocephalus, extra-axial collection, or mass lesion/mass effect. Vascular:  No hyperdense vessel or other acute findings. Skull: No evidence of fracture or other significant bone abnormality. Sinuses/Orbits:  No acute findings. Other: None. IMPRESSION: Negative. Electronically Signed   By: Danae Orleans M.D.   On: 03/20/2023 14:00    Procedures Procedures    Medications Ordered in ED Medications  iohexol (OMNIPAQUE) 350 MG/ML injection 75 mL (has no administration in time range)  metoCLOPramide (REGLAN) injection 10 mg (10 mg Intravenous Given 03/21/23 1718)  sodium chloride 0.9 % bolus 500 mL (0 mLs Intravenous Stopped 03/21/23 2311)  LORazepam (ATIVAN) tablet 1 mg (1 mg Oral Given 03/21/23 1746)  gadobutrol (GADAVIST) 1 MMOL/ML injection 10 mL (10 mLs Intravenous Contrast Given 03/21/23 1846)    ED Course/ Medical Decision Making/ A&P Clinical Course as of 03/21/23 2329  Sun Mar 21, 2023  1703 Discussed with Dr. Iver Nestle with neurology.  Recommends MRI with and without, CTA.  Can consider CTV if there is a positional component. [JD]    Clinical Course User Index [JD] Laurence Spates, MD                                 Medical Decision Making Amount and/or Complexity of Data Reviewed Labs: ordered. Radiology: ordered.  Risk Prescription drug management.   Medical Decision Making:   JOLEE CRITCHER is a 68 y.o. female who presented to the ED today with hypotension, headache.  I evaluated patient in the ED yesterday for the same.  She had a CT head which was unremarkable, blood pressure was improved and she was to start hydrochlorothiazide.  She had relatively quick onset headache today, associated with significant hypertension.  She is hypertensive here although improved after taking her hydrochlorothiazide.  I have lower suspicion for hypertensive emergency.  Normal logic exam, no signs of stroke.  Consider possible subarachnoid hemorrhage, aneurysm.  No signs of CNS infection.  Given recurrent headache associate with hypertension I did discuss with Dr. Iver Nestle with neurology.  She recommends MRI with and without as well as CTA head and neck to evaluate for stroke, press, aneurysm or other arterial abnormality.  Could consider CTV if there is a positional component.  I discussed again with patient, is no positional component to her headache.  On reassessment after Reglan she is pain-free.  She is still moderately hypotensive but not high enough to consider hypertensive emergency and no signs of endorgan damage.  MRI was obtained, no signs of acute abnormality.  CTA head and neck are pending.  Review pressure my reassessment was 164/100.  She has some leukocyturia but no bacteria no urinary symptoms I low suspicion for UTI.  And it was given to oncoming provider with plan to follow-up CTA and reassess.   Patient placed on continuous vitals and telemetry monitoring while in ED which was reviewed periodically.  Reviewed and confirmed nursing documentation for past medical history, family history, social history.   Patient's presentation is most consistent with acute complicated illness / injury requiring diagnostic workup.           Final Clinical Impression(s) / ED Diagnoses Final diagnoses:  Primary hypertension  Acute nonintractable headache, unspecified headache type    Rx / DC  Orders ED Discharge Orders     None         Laurence Spates, MD 03/21/23 2329

## 2023-03-21 NOTE — ED Notes (Signed)
Pt transported to CT and returned to pt room

## 2023-03-21 NOTE — ED Triage Notes (Signed)
Patient to ED by POV for hypertension. Patient states she was seen yesterday for HTN and given RX. She took 1 dose of med states BP lowered then increased again higher and headache has worsened.

## 2023-03-22 NOTE — ED Provider Notes (Signed)
I assumed care of this patient from previous provider.  Please see their note for further details of history, exam, and MDM.   Briefly patient is a 68 y.o. female who presented sudden onset headache in the setting of elevated blood pressure.  Patient's labs are reassuring.  MRI was negative for stroke.  Currently awaiting CTA to rule out dissection.  CTA is negative.  Patient's blood pressure down into the 140s.  No longer has a headache.  The patient appears reasonably screened and/or stabilized for discharge and I doubt any other medical condition or other [facility] requiring further screening, evaluation, or treatment in the ED at this time. I have discussed the findings, Dx and Tx plan with the patient/family who expressed understanding and agree(s) with the plan. Discharge instructions discussed at length. The patient/family was given strict return precautions who verbalized understanding of the instructions. No further questions at time of discharge.  Disposition: Discharge  Condition: Good  ED Discharge Orders     None        Follow Up: Theodis Shove, DO [address] [place] [address] [phone]  Call  to schedule an appointment for close follow up         Mclane Arora, Amadeo Garnet, MD 03/22/23 0020

## 2023-05-20 ENCOUNTER — Ambulatory Visit
Admission: RE | Admit: 2023-05-20 | Discharge: 2023-05-20 | Disposition: A | Discharge status: Home / Self Care | Payer: Medicare Other | Source: Ambulatory Visit | Attending: Adult Health | Admitting: Adult Health

## 2023-05-20 DIAGNOSIS — M8588 Other specified disorders of bone density and structure, other site: Secondary | ICD-10-CM

## 2023-07-22 ENCOUNTER — Emergency Department (HOSPITAL_COMMUNITY)

## 2023-07-22 ENCOUNTER — Encounter (HOSPITAL_COMMUNITY): Payer: Self-pay | Admitting: Emergency Medicine

## 2023-07-22 ENCOUNTER — Emergency Department (HOSPITAL_COMMUNITY)
Admission: EM | Admit: 2023-07-22 | Discharge: 2023-07-22 | Disposition: A | Discharge status: Home / Self Care | Attending: Emergency Medicine | Admitting: Emergency Medicine

## 2023-07-22 ENCOUNTER — Other Ambulatory Visit: Payer: Self-pay

## 2023-07-22 DIAGNOSIS — R059 Cough, unspecified: Secondary | ICD-10-CM | POA: Diagnosis present

## 2023-07-22 DIAGNOSIS — J810 Acute pulmonary edema: Secondary | ICD-10-CM | POA: Diagnosis not present

## 2023-07-22 DIAGNOSIS — Z79899 Other long term (current) drug therapy: Secondary | ICD-10-CM | POA: Insufficient documentation

## 2023-07-22 DIAGNOSIS — I10 Essential (primary) hypertension: Secondary | ICD-10-CM | POA: Diagnosis not present

## 2023-07-22 LAB — CBC WITH DIFFERENTIAL/PLATELET
Abs Immature Granulocytes: 0.02 10*3/uL (ref 0.00–0.07)
Basophils Absolute: 0 10*3/uL (ref 0.0–0.1)
Basophils Relative: 0 %
Eosinophils Absolute: 0.2 10*3/uL (ref 0.0–0.5)
Eosinophils Relative: 3 %
HCT: 38.9 % (ref 36.0–46.0)
Hemoglobin: 12.4 g/dL (ref 12.0–15.0)
Immature Granulocytes: 0 %
Lymphocytes Relative: 46 %
Lymphs Abs: 3.3 10*3/uL (ref 0.7–4.0)
MCH: 29.9 pg (ref 26.0–34.0)
MCHC: 31.9 g/dL (ref 30.0–36.0)
MCV: 93.7 fL (ref 80.0–100.0)
Monocytes Absolute: 0.5 10*3/uL (ref 0.1–1.0)
Monocytes Relative: 7 %
Neutro Abs: 3.1 10*3/uL (ref 1.7–7.7)
Neutrophils Relative %: 44 %
Platelets: 228 10*3/uL (ref 150–400)
RBC: 4.15 MIL/uL (ref 3.87–5.11)
RDW: 14 % (ref 11.5–15.5)
WBC: 7.1 10*3/uL (ref 4.0–10.5)
nRBC: 0 % (ref 0.0–0.2)

## 2023-07-22 LAB — COMPREHENSIVE METABOLIC PANEL WITH GFR
ALT: 24 U/L (ref 0–44)
AST: 32 U/L (ref 15–41)
Albumin: 4 g/dL (ref 3.5–5.0)
Alkaline Phosphatase: 94 U/L (ref 38–126)
Anion gap: 8 (ref 5–15)
BUN: 17 mg/dL (ref 8–23)
CO2: 26 mmol/L (ref 22–32)
Calcium: 10 mg/dL (ref 8.9–10.3)
Chloride: 102 mmol/L (ref 98–111)
Creatinine, Ser: 0.69 mg/dL (ref 0.44–1.00)
GFR, Estimated: >60 mL/min (ref >60)
Glucose, Bld: 89 mg/dL (ref 70–99)
Potassium: 4.1 mmol/L (ref 3.5–5.1)
Sodium: 136 mmol/L (ref 135–145)
Total Bilirubin: 1 mg/dL (ref 0.0–1.2)
Total Protein: 8.2 g/dL — ABNORMAL HIGH (ref 6.5–8.1)

## 2023-07-22 LAB — D-DIMER, QUANTITATIVE: D-Dimer, Quant: 0.97 ug{FEU}/mL — ABNORMAL HIGH (ref 0.00–0.50)

## 2023-07-22 LAB — RESP PANEL BY RT-PCR (RSV, FLU A&B, COVID)  RVPGX2
Influenza A by PCR: NEGATIVE
Influenza B by PCR: NEGATIVE
Resp Syncytial Virus by PCR: NEGATIVE
SARS Coronavirus 2 by RT PCR: NEGATIVE

## 2023-07-22 LAB — TROPONIN I (HIGH SENSITIVITY)
Troponin I (High Sensitivity): 5 ng/L (ref <18)
Troponin I (High Sensitivity): 5 ng/L (ref <18)

## 2023-07-22 LAB — BRAIN NATRIURETIC PEPTIDE: B Natriuretic Peptide: 15.2 pg/mL (ref 0.0–100.0)

## 2023-07-22 MED ORDER — IOHEXOL 350 MG/ML SOLN
75.0000 mL | Freq: Once | INTRAVENOUS | Status: AC | PRN
  Administered 2023-07-22: 75 mL via INTRAVENOUS

## 2023-07-22 MED ORDER — SODIUM CHLORIDE (PF) 0.9 % IJ SOLN
INTRAMUSCULAR | Status: AC
Start: 2023-07-22 — End: ?
  Filled 2023-07-22: qty 50

## 2023-07-22 MED ORDER — FUROSEMIDE 20 MG PO TABS
20.0000 mg | ORAL_TABLET | Freq: Every day | ORAL | 0 refills | Status: AC
Start: 2023-07-22 — End: ?

## 2023-07-22 NOTE — ED Triage Notes (Signed)
 Patient comes in for a cough for 10 days some days it is productive and some days it is not. She noticed crackles and wheeze. She did have an xray yesterday.  Reading of pulmonary edema per patient.  Denies swelling.  Denies fevers.

## 2023-07-22 NOTE — Discharge Instructions (Signed)
 You were seen in the emerged part for shortness of breath and coughing There was no blood clot in your lungs Your chest x-ray did show some mild congestion in your lungs For this reason we gave you a dose of Lasix  to help get some fluid out Pick up this medication from your pharmacy and take as directed for the next 5 days This medication will make you urinate more Follow-up with your primary care doctor in 1 week for reevaluation Return to the Emergency Department for chest pain trouble breathing or any other concerns

## 2023-07-22 NOTE — ED Provider Notes (Signed)
 [place] EMERGENCY DEPARTMENT AT [facility] Provider Note   CSN: [id number] Arrival date & time: 07/22/23  9147     History  Chief Complaint  Patient presents with   Cough    [name] is a 68 y.o. female.  With a history of hypertension, hyperlipidemia, MI and ulcerative colitis who presents to ED for coughing.  Patient reports now 10 days of ongoing productive coughing along with reported crackles and wheezing.  Cough has been somewhat productive with white and green sputum.  Also mentions a loss of taste.  Home COVID test was negative.  She was seen by her primary care doctor yesterday and sent to the ED for further evaluation given concern for pulmonary edema on chest x-ray.  No lower extremity edema or appreciable weight gain.  Also denies fevers, chills, nausea, vomiting chest pain.  Does not feel short of breath unless she is walking around.   Cough      Home Medications Prior to Admission medications   Medication Sig Start Date End Date Taking? Authorizing Provider  furosemide (LASIX) 20 MG tablet Take 1 tablet (20 mg total) by mouth daily. 07/22/23  Yes Sallyanne Creamer, DO  acetaminophen  (TYLENOL ) 500 MG tablet Take 500 mg by mouth every 6 (six) hours as needed (For arthritis pain.).     [provider]  Adalimumab  (HUMIRA  PEN) 40 MG/0.4ML PNKT Inject 40 mg into the skin every 14 (fourteen) days. After starter kit. Patient not taking: Reported on 09/16/2022 11/12/17   Albertina Hugger, MD  Adalimumab  (HUMIRA  PEN-CD/UC/HS STARTER) 40 MG/0.8ML PNKT Inject 40 mg into the skin as directed. Needs starter pack: Inject 160 mg [place] on day 1, 80 mg [place] on day 15, then maintenance dose. Patient not taking: Reported on 09/16/2022 11/18/17   Albertina Hugger, MD  Calcium Carb-Cholecalciferol (CALCIUM-VITAMIN D ) 600-400 MG-UNIT TABS Take 1 tablet by mouth daily.    [provider]  cephALEXin  (KEFLEX ) 500 MG capsule Take 1 capsule (500 mg total) by mouth  4 (four) times daily. Patient not taking: Reported on 07/22/2022 12/13/21   Prosperi, Christian H, PA-C  cephALEXin  (KEFLEX ) 500 MG capsule Take 1 capsule (500 mg total) by mouth 4 (four) times daily. Patient not taking: Reported on 07/22/2022 12/13/21   Prosperi, Christian H, PA-C  dicyclomine  (BENTYL ) 10 MG capsule Take 1 capsule (10 mg total) by mouth every 8 (eight) hours as needed for spasms. 10/08/17   Albertina Hugger, MD  hydrochlorothiazide  (HYDRODIURIL ) 25 MG tablet Take 1 tablet (25 mg total) by mouth daily. 03/20/23   Davis, Jonathon H, MD  hydrocortisone  (ANUSOL -HC) 25 MG suppository Place 1 suppository (25 mg total) rectally 2 (two) times daily. 10/18/17   Armenta Landau, MD  metoprolol  succinate (TOPROL -XL) 50 MG 24 hr tablet Take 50 mg by mouth daily. Take with or immediately following a meal.    [provider]  Multiple Vitamin (MULTIVITAMIN) tablet Take 1 tablet by mouth daily.    [provider]  ondansetron  (ZOFRAN ) 4 MG tablet Take 1 tablet (4 mg total) by mouth every 8 (eight) hours as needed for nausea or vomiting. 10/08/17   Danis, Cordelia Dessert, MD  ondansetron  (ZOFRAN -ODT) 4 MG disintegrating tablet Take 1 tablet (4 mg total) by mouth every 8 (eight) hours as needed for nausea or vomiting. 04/28/22   Tonya Fredrickson, MD  ondansetron  (ZOFRAN -ODT) 4 MG disintegrating tablet Take 1 tablet (4 mg total) by mouth  every 8 (eight) hours as needed for nausea or vomiting. 04/30/22   Zackowski, Scott, MD  oxyCODONE -acetaminophen  (PERCOCET/ROXICET) 5-325 MG tablet Take 1 tablet by mouth every 6 (six) hours as needed for severe pain. Patient not taking: Reported on 09/16/2022 04/28/22   Tonya Fredrickson, MD  predniSONE  (DELTASONE ) 20 MG tablet Take 1-2 tablets (20-40 mg total) by mouth daily with breakfast. Take 2 tablets (40mg ) daily x 14 days, then 1.5 tablets(30mg ) daily x 14 days, then 1 tablets (20mg ) daily 10/19/17   Armenta Landau, MD  tamsulosin  (FLOMAX ) 0.4 MG  CAPS capsule Take 1 capsule (0.4 mg total) by mouth daily. Patient not taking: Reported on 09/16/2022 04/28/22   Tonya Fredrickson, MD      Allergies    Prednisone     Review of Systems   Review of Systems  Respiratory:  Positive for cough.     Physical Exam Updated Vital Signs BP (!) 149/99   Pulse 93   Temp 97.9 F (36.6 C)   Resp 18   Ht 5\' 6"  (1.676 m)   Wt 108 kg   SpO2 94%   BMI 38.41 kg/m  Physical Exam Vitals and nursing note reviewed.  HENT:     Head: Normocephalic and atraumatic.  Eyes:     Pupils: Pupils are equal, round, and reactive to light.  Cardiovascular:     Rate and Rhythm: Normal rate and regular rhythm.  Pulmonary:     Effort: Pulmonary effort is normal. No respiratory distress.     Breath sounds: Wheezing present.  Abdominal:     Palpations: Abdomen is soft.     Tenderness: There is no abdominal tenderness.  Skin:    General: Skin is warm and dry.  Neurological:     Mental Status: She is alert.  Psychiatric:        Mood and Affect: Mood normal.     ED Results / Procedures / Treatments   Labs (all labs ordered are listed, but only abnormal results are displayed) Labs Reviewed  COMPREHENSIVE METABOLIC PANEL WITH GFR - Abnormal; Notable for the following components:      Result Value   Total Protein 8.2 (*)    All other components within normal limits  D-DIMER, QUANTITATIVE - Abnormal; Notable for the following components:   D-Dimer, Quant 0.97 (*)    All other components within normal limits  RESP PANEL BY RT-PCR (RSV, FLU A&B, COVID)  [id number]  BRAIN NATRIURETIC PEPTIDE  CBC WITH DIFFERENTIAL/PLATELET  TROPONIN I (HIGH SENSITIVITY)  TROPONIN I (HIGH SENSITIVITY)    EKG EKG Interpretation Date/Time:  Thursday Jul 22 2023 09:01:14 EDT Ventricular Rate:  82 PR Interval:  189 QRS Duration:  87 QT Interval:  393 QTC Calculation: 459 R Axis:   41  Text Interpretation: Sinus rhythm Atrial premature complex Consider left atrial  enlargement Abnormal R-wave progression, early transition Confirmed by Rafael Bun [phone]) on 07/22/2023 11:54:50 AM  Radiology CT Angio Chest PE W and/or Wo Contrast Result Date: 07/22/2023 CLINICAL DATA:  Pulmonary embolism suspected EXAM: CT ANGIOGRAPHY CHEST WITH CONTRAST TECHNIQUE: Multidetector CT imaging of the chest was performed using the standard protocol during bolus administration of intravenous contrast. Multiplanar CT image reconstructions and MIPs were obtained to evaluate the vascular anatomy. Multiplanar image (3D post-processing) reconstructions and MIPs were obtained to evaluate the vascular anatomy. RADIATION DOSE REDUCTION: This exam was performed according to the departmental dose-optimization program which includes automated exposure control, adjustment of the mA and/or kV according to  patient size and/or use of iterative reconstruction technique. CONTRAST:  75mL OMNIPAQUE  IOHEXOL  350 MG/ML SOLN COMPARISON:  None Available. FINDINGS: Cardiovascular: Satisfactory opacification of the pulmonary arteries to the segmental level. No evidence of pulmonary embolism. Normal heart size. No pericardial effusion. Mediastinum/Nodes: No enlarged mediastinal, hilar, or axillary lymph nodes. Thyroid gland, trachea, and esophagus demonstrate no significant findings. Lungs/Pleura: No pleural effusion or pneumothorax. A noncalcified nodule is present in the right upper lobe which contacts the pleural surface measuring 7 mm on image 56 of series 4. Upper Abdomen: No acute abnormality. Musculoskeletal: Mild degenerative changes. Review of the MIP images confirms the above findings. IMPRESSION: 1. No evidence of pulmonary embolism to the segmental level. 2. Noncalcified right upper lobe pulmonary nodule measuring 7 mm. Non-contrast chest CT at 6-12 months is recommended. If the nodule is stable at time of repeat CT, then future CT at 18-24 months (from today's scan) is considered optional for low-risk  patients, but is recommended for high-risk patients. This recommendation follows the consensus statement: Guidelines for Management of Incidental Pulmonary Nodules Detected on CT Images: From the Fleischner Society 2017; Radiology 2017; 284:228-243. Electronically Signed   By: Reagan Camera M.D.   On: 07/22/2023 11:47   DG Chest Portable 1 View Result Date: 07/22/2023 CLINICAL DATA:  Productive cough for several days EXAM: PORTABLE CHEST 1 VIEW COMPARISON:  None Available. FINDINGS: Cardiac shadow is prominent but accentuated by the portable technique. Aortic calcifications are seen. Mild central vascular congestion is noted without significant edema. No bony abnormality is noted. IMPRESSION: Mild central vascular congestion. Electronically Signed   By: Violeta Grey M.D.   On: 07/22/2023 10:19    Procedures Ultrasound ED Echo  Date/Time: 07/22/2023 11:03 AM  Performed by: Sallyanne Creamer, DO Authorized by: Sallyanne Creamer, DO   Procedure details:    Indications: dyspnea     Views: subxiphoid, parasternal long axis view, parasternal short axis view, apical 4 chamber view and IVC view     Images: archived   Findings:    Pericardium: no pericardial effusion     LV Function: normal (>50% EF)     RV Diameter: normal     IVC: normal   Impression:    Impression: normal   Ultrasound ED Thoracic  Date/Time: 07/22/2023 11:03 AM  Performed by: Sallyanne Creamer, DO Authorized by: Sallyanne Creamer, DO   Procedure details:    Indications: dyspnea     Assessment for:  Hemothorax, pleural effusion, pneumothorax, pneumonia and interstitial syndrome   Left lung pleural:  Visualized   Right lung pleural:  Visualized   Images: archived   Findings:    A-lines noted throughout: not identified     B-lines noted throughout: not identified   Right Lung Findings:     right lung sliding    no right lung consolidation    no right lung pleural effusion  Left Lung Findings:     left lung sliding    no  left lung consolidation    no left lung pleural effusion Impression:    Impression comment:  Scant B-lines in lower lung fields with no evidence of effusion     Medications Ordered in ED Medications  iohexol  (OMNIPAQUE ) 350 MG/ML injection 75 mL (75 mLs Intravenous Contrast Given 07/22/23 1115)    ED Course/ Medical Decision Making/ A&P Clinical Course as of 07/22/23 1259  Thu Jul 22, 2023  1257 D-dimer elevated.  No PE on CTA chest.  Mild pulmonary edema.  Informed patient of incidental finding of right pulmonary nodule which require follow-up.  No elevation in BNP.  Low suspicion for new onset heart failure.  No focal consolidation that would be consistent with pneumonia and no white count.  Delta troponin negative no ischemic changes on EKG.  Suspicion for ACS.  Patient has remained stable on room air.  Her primary care doctor previously prescribed her Tessalon to help with her coughing.  Will give her a few days of Lasix to help with pulmonary edema and she will follow-up with her PCP [MP]    Clinical Course User Index [MP] Sallyanne Creamer, DO                                 Medical Decision Making 68 year old female with history as above presenting for 10 days of ongoing coughing wheezing and finding a pulmonary edema on chest x-ray PCP office yesterday.  No chest pain no history of heart failure.  Afebrile and hypertensive here.  Stable on room air.  Lung auscultation notable for expiratory wheezing diffusely.  No prior history of asthma or COPD non-smoker.  Differential diagnosis include viral respiratory infection, pneumonia, new onset heart failure, dysrhythmia and ACS.  Lower suspicion for pulmonary embolism however she is not in anticoagulation.  Will obtain chest x-ray, EKG, BNP, high-sensitivity troponin and D-dimer.  Will obtain CTA chest if D-dimer significantly elevated to evaluate for PE.  Amount and/or Complexity of Data Reviewed Labs: ordered. Radiology:  ordered.  Risk Prescription drug management.           Final Clinical Impression(s) / ED Diagnoses Final diagnoses:  Acute pulmonary edema (HCC)    Rx / DC Orders ED Discharge Orders          Ordered    furosemide (LASIX) 20 MG tablet  Daily        07/22/23 1258              Rafael Bun A, DO 07/22/23 1259

## 2023-10-21 ENCOUNTER — Emergency Department (HOSPITAL_COMMUNITY)
Admission: EM | Admit: 2023-10-21 | Discharge: 2023-10-21 | Disposition: A | Discharge status: Home / Self Care | Attending: Emergency Medicine | Admitting: Emergency Medicine

## 2023-10-21 ENCOUNTER — Other Ambulatory Visit: Payer: Self-pay

## 2023-10-21 ENCOUNTER — Encounter (HOSPITAL_COMMUNITY): Payer: Self-pay

## 2023-10-21 DIAGNOSIS — L03116 Cellulitis of left lower limb: Secondary | ICD-10-CM | POA: Insufficient documentation

## 2023-10-21 DIAGNOSIS — M7989 Other specified soft tissue disorders: Secondary | ICD-10-CM | POA: Diagnosis present

## 2023-10-21 MED ORDER — DOXYCYCLINE HYCLATE 100 MG PO CAPS: 100.0000 mg | ORAL_CAPSULE | Freq: Two times a day (BID) | ORAL | 0 refills | Status: DC

## 2023-10-21 NOTE — ED Triage Notes (Signed)
 Pt presents to ED from home C/O abscess to L shin.

## 2023-10-21 NOTE — ED Provider Notes (Signed)
 [place] EMERGENCY DEPARTMENT AT [facility] Provider Note   CSN: [id number] Arrival date & time: 10/21/23  1544     Patient presents with: Abscess   [name] is a 68 y.o. female.   68 yo F with a chief complaint of a lesion to her left shin.  She is really worried that this is a blood clot.  Has been there for about a week.  No fevers or chills.  Had cellulitis of that leg in the past.  Denies injury to that leg.   Abscess      Prior to Admission medications   Medication Sig Start Date End Date Taking? Authorizing Provider  doxycycline  (VIBRAMYCIN ) 100 MG capsule Take 1 capsule (100 mg total) by mouth 2 (two) times daily. One po bid x 7 days 10/21/23  Yes Emil Share, DO  acetaminophen  (TYLENOL ) 500 MG tablet Take 500 mg by mouth every 6 (six) hours as needed (For arthritis pain.).     [provider]  Adalimumab  (HUMIRA  PEN) 40 MG/0.4ML PNKT Inject 40 mg into the skin every 14 (fourteen) days. After starter kit. Patient not taking: Reported on 09/16/2022 11/12/17   Legrand Victory LITTIE DOUGLAS, MD  Adalimumab  (HUMIRA  PEN-CD/UC/HS STARTER) 40 MG/0.8ML PNKT Inject 40 mg into the skin as directed. Needs starter pack: Inject 160 mg [place] on day 1, 80 mg [place] on day 15, then maintenance dose. Patient not taking: Reported on 09/16/2022 11/18/17   Legrand Victory LITTIE DOUGLAS, MD  Calcium Carb-Cholecalciferol (CALCIUM-VITAMIN D ) 600-400 MG-UNIT TABS Take 1 tablet by mouth daily.    [provider]  cephALEXin  (KEFLEX ) 500 MG capsule Take 1 capsule (500 mg total) by mouth 4 (four) times daily. Patient not taking: Reported on 07/22/2022 12/13/21   Prosperi, Christian H, PA-C  cephALEXin  (KEFLEX ) 500 MG capsule Take 1 capsule (500 mg total) by mouth 4 (four) times daily. Patient not taking: Reported on 07/22/2022 12/13/21   Prosperi, Christian H, PA-C  dicyclomine  (BENTYL ) 10 MG capsule Take 1 capsule (10 mg total) by mouth every 8 (eight) hours as needed for spasms. 10/08/17   Legrand Victory LITTIE DOUGLAS, MD  furosemide  (LASIX ) 20 MG tablet Take 1 tablet (20 mg total) by mouth daily. 07/22/23   Pamella Ozell LABOR, DO  hydrochlorothiazide  (HYDRODIURIL ) 25 MG tablet Take 1 tablet (25 mg total) by mouth daily. 03/20/23   Davis, Jonathon H, MD  hydrocortisone  (ANUSOL -HC) 25 MG suppository Place 1 suppository (25 mg total) rectally 2 (two) times daily. 10/18/17   Sebastian Toribio GAILS, MD  metoprolol  succinate (TOPROL -XL) 50 MG 24 hr tablet Take 50 mg by mouth daily. Take with or immediately following a meal.    [provider]  Multiple Vitamin (MULTIVITAMIN) tablet Take 1 tablet by mouth daily.    [provider]  ondansetron  (ZOFRAN ) 4 MG tablet Take 1 tablet (4 mg total) by mouth every 8 (eight) hours as needed for nausea or vomiting. 10/08/17   Legrand Victory LITTIE DOUGLAS, MD  ondansetron  (ZOFRAN -ODT) 4 MG disintegrating tablet Take 1 tablet (4 mg total) by mouth every 8 (eight) hours as needed for nausea or vomiting. 04/28/22   Towana Ozell BROCKS, MD  ondansetron  (ZOFRAN -ODT) 4 MG disintegrating tablet Take 1 tablet (4 mg total) by mouth every 8 (eight) hours as needed for nausea or vomiting. 04/30/22   Zackowski, Scott, MD  oxyCODONE -acetaminophen  (PERCOCET/ROXICET) 5-325 MG tablet Take 1 tablet by mouth every 6 (six) hours as needed for severe pain. Patient not  taking: Reported on 09/16/2022 04/28/22   Towana Ozell BROCKS, MD  predniSONE  (DELTASONE ) 20 MG tablet Take 1-2 tablets (20-40 mg total) by mouth daily with breakfast. Take 2 tablets (40mg ) daily x 14 days, then 1.5 tablets(30mg ) daily x 14 days, then 1 tablets (20mg ) daily 10/19/17   Sebastian Toribio GAILS, MD  tamsulosin  (FLOMAX ) 0.4 MG CAPS capsule Take 1 capsule (0.4 mg total) by mouth daily. Patient not taking: Reported on 09/16/2022 04/28/22   Towana Ozell BROCKS, MD    Allergies: Prednisone     Review of Systems  Updated Vital Signs BP (!) 177/107 (BP Location: Right Arm)   Pulse 95   Temp 98 F (36.7 C) (Oral)   Resp 20   Ht 5' 6  (1.676 m)   Wt 104.3 kg   SpO2 98%   BMI 37.12 kg/m   Physical Exam Vitals and nursing note reviewed.  Constitutional:      General: She is not in acute distress.    Appearance: She is well-developed. She is not diaphoretic.  HENT:     Head: Normocephalic and atraumatic.  Eyes:     Pupils: Pupils are equal, round, and reactive to light.  Cardiovascular:     Rate and Rhythm: Normal rate and regular rhythm.     Heart sounds: No murmur heard.    No friction rub. No gallop.  Pulmonary:     Effort: Pulmonary effort is normal.     Breath sounds: No wheezing or rales.  Abdominal:     General: There is no distension.     Palpations: Abdomen is soft.     Tenderness: There is no abdominal tenderness.  Musculoskeletal:        General: Tenderness present.     Cervical back: Normal range of motion and neck supple.     Comments: Small circumscribed erythematous lesion.  No obvious fluctuance or induration.  Skin:    General: Skin is warm and dry.  Neurological:     Mental Status: She is alert and oriented to person, place, and time.  Psychiatric:        Behavior: Behavior normal.     (all labs ordered are listed, but only abnormal results are displayed) Labs Reviewed - No data to display  EKG: None  Radiology: No results found.   Procedures  Emergency Focused Ultrasound Exam Limited Ultrasound of Soft Tissue   Performed and interpreted by Dr. Emil Indication: evaluation for infection or foreign body Transverse and Sagittal views of L shin are obtained in real time for the purposes of evaluation of skin and underlying soft tissues.  Findings: no heterogeneous fluid collection, with hyperemia/edema of surrounding tissue Interpretation: no abscess, with cellulitis Images archived electronically.  CPT Codes:    Lower extremity [phone]   Medications Ordered in the ED - No data to display                                  Medical Decision Making Risk Prescription  drug management.   68 yo F with a chief complaints of a lesion to her skin.  Clinically this looks like an abscess that maybe is in the stage of healing.  I do not see an obvious fluid collection on ultrasound.  Will start on oral antibiotics.  Warm compresses.  PCP follow-up.  7:28 PM:  I have discussed the diagnosis/risks/treatment options with the patient.  Evaluation and diagnostic testing in the  emergency department does not suggest an emergent condition requiring admission or immediate intervention beyond what has been performed at this time.  They will follow up with PCP. We also discussed returning to the ED immediately if new or worsening sx occur. We discussed the sx which are most concerning (e.g., sudden worsening pain, fever, inability to tolerate by mouth, rapid spreading) that necessitate immediate return. Medications administered to the patient during their visit and any new prescriptions provided to the patient are listed below.  Medications given during this visit Medications - No data to display   The patient appears reasonably screen and/or stabilized for discharge and I doubt any other medical condition or other [facility] requiring further screening, evaluation, or treatment in the ED at this time prior to discharge.       Final diagnoses:  Cellulitis of left lower extremity    ED Discharge Orders          Ordered    doxycycline  (VIBRAMYCIN ) 100 MG capsule  2 times daily        10/21/23 1803               Emil Share, DO 10/21/23 1928

## 2023-10-21 NOTE — Discharge Instructions (Signed)
Warm compresses 4 times a day.  Please return for rapid spreading redness or if you develop a fever.

## 2023-11-08 ENCOUNTER — Ambulatory Visit: Attending: Student in an Organized Health Care Education/Training Program | Admitting: Occupational Therapy

## 2023-11-08 DIAGNOSIS — I890 Lymphedema, not elsewhere classified: Secondary | ICD-10-CM | POA: Insufficient documentation

## 2023-11-17 ENCOUNTER — Encounter: Payer: Self-pay | Admitting: Occupational Therapy

## 2023-11-17 ENCOUNTER — Ambulatory Visit: Admitting: Occupational Therapy

## 2023-11-17 DIAGNOSIS — I890 Lymphedema, not elsewhere classified: Secondary | ICD-10-CM | POA: Diagnosis present

## 2023-11-17 NOTE — Therapy (Addendum)
 OUTPATIENT OCCUPATIONAL THERAPY EVALUATION  BILATERAL LOWER EXTREMITY/ BILATERAL LOWER QUADRANT  LYMPHEDEMA  Patient Name: [name] MRN: [id number] DOB:[birth year], 68 y.o., female Today's Date: 11/19/2023  END OF SESSION:   OT End of Session - 11/19/23 1414     Visit Number 1    Number of Visits 36    Date for Recertification  02/15/24    OT Start Time 0806    OT Stop Time 0910    OT Time Calculation (min) 64 min    Activity Tolerance Patient tolerated treatment well;No increased pain    Behavior During Therapy [facility] for tasks assessed/performed            Past Medical History:  Diagnosis Date   Arthritis    C. difficile diarrhea    Heart attack (HCC) 10/24/2008   HLD (hyperlipidemia)    HTN (hypertension)    UC (ulcerative colitis) (HCC)    Past Surgical History:  Procedure Laterality Date   BIOPSY  10/13/2017   Procedure: BIOPSY;  Surgeon: Wilhelmenia Aloha Raddle., MD;  Location: [facility];  Service: Gastroenterology;;   CESAREAN SECTION     x 3   COLONOSCOPY WITH PROPOFOL  N/A 10/13/2017   Procedure: COLONOSCOPY WITH PROPOFOL ;  Surgeon: Wilhelmenia Aloha Raddle., MD;  Location: [facility];  Service: Gastroenterology;  Laterality: N/A;   TONSILLECTOMY     age 17   TOTAL KNEE ARTHROPLASTY Bilateral 10/22/2008   Patient Active Problem List   Diagnosis Date Noted   Dehydration    Other ulcerative colitis with rectal bleeding (HCC)    Ulcerative colitis, acute (HCC) 10/12/2017   History of myocardial infarction 10/12/2017   AKI (acute kidney injury) 10/12/2017   Hyperglycemia 10/12/2017   Hyperlipidemia 10/12/2017   Thrombocytosis 10/12/2017   Leukocytosis 10/12/2017    PCP: SULA How, MD  REFERRING PROVIDER: Vernell Farm, MD  REFERRING DIAG: I89.0  THERAPY DIAG:  Lymphedema  Rationale for Evaluation and Treatment: Rehabilitation  ONSET DATE: ~ 1/2 years ago- I would say July, in 2023.  SUBJECTIVE:                                                                                                                                                                                            SUBJECTIVE STATEMENT: [name] is referred to Occupational Therapy by Vernell Farm, MD, for evaluation and treatment of BLE/BLQ lymphedema.   PERTINENT HISTORY: TAKES AMLODIPINE; arthritis, HTN, B TKA 2010, Hx mi 2010, CVI, Obesity, CAD, reports recurrent episodes of cellulitis  PAIN:  Are you having pain? Yes; legs and ankles 6/10, throbbing, pressure; improves with Tylenol , elevation, soaking my feet, rubbing;  worsens with extended standing, walking and/ or dependent sitting > 15 minutes  PRECAUTIONS: Other: LYMPHEDEMA  WEIGHT BEARING RESTRICTIONS: No  FALLS:  Has patient fallen in last 6 months? No  LIVING ENVIRONMENT: Lives with: lives alone Lives in: House/apartment Stairs: No; level entry Has following equipment at home: None  OCCUPATION: part time LPN; retired full time LPN  LEISURE: garden, flowers, plants, watching sports  HAND DOMINANCE: right   PRIOR LEVEL OF FUNCTION: Independent  PATIENT GOALS: to reduce leg swelling, associated pain, and keep them from getting worse. To learn about lymphedema   OBJECTIVE: Note: Objective measures were completed at Evaluation unless otherwise noted.  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 SURGERY TYPE/DATE: Non-cancer related INFECTIONS: recurrent cellulitis WOUNDS: +hx for L medial ancke ulcer 2/2 CVI  LYMPHEDEMA LIFE IMPACT SCALE (LLIS): TBA  BLE COMPARATIVE LIMB VOLUMETRICS TBA OT Rx 1  LANDMARK RIGHT    R LEG (A-D) N/A  R THIGH (E-G) ml  R FULL LIMB (A-G) ml  Limb Volume differential (LVD)  %  Volume change since initial %  Volume change overall V  (Blank rows  = not tested)  LANDMARK LEFT    L LEG (A-D) N/A  L THIGH (E-G) ml  L FULL LIMB (A-G) ml  Limb Volume differential (LVD)  %  Volume change since initial %  Volume change overall %  (Blank rows = not tested)    SKIN CONDITION/ TISSUE INTEGRITY:   Skin  Description Hyper Keratosis Peau d' Orange Shiny Tight Fibrotic/ Indurated Fatty/ doughy Lipo dermato sclerosis Spongy/ boggy   x x  x x  x x   Skin dry Flaky WNL Macerated   x x     Color Redness Varicosities Blanching Hemosiderin Stain Mottled   x x  x  x   Odor Malodorous Yeast Fungal infection  WNL      x   Temperature Warm Cool wnl    x     Pitting Edema   1+ 2+ 3+ 4+ Non-pitting         x   Girth Symmetrical Asymmetrical                   Distribution    L>R toes to groin    Stemmer Sign Positive Negative   +    Lymphorrhea History Of:  Present Absent   x  x    Wounds History Of Present Absent Venous Arterial Pressure Sheer   x  x x       Signs of Infection Odor Defined Border Erythema Acute Swelling Drainage                  Sensation Light Touch Deep pressure Hypersensitivity   In tact Impaired In tact Impaired Absent Impaired   x x x  x     Nails WNL   Fungus nail dystrophy   TBA     Hair Growth Symmetrical Asymmetrical   x    Skin Creases Base of toes  Ankles   Base of Fingers knees       Abdominal pannus Thigh Lobules  Face/ neck   x x  x       TREATMENT THIS DATE:  OT evaluation Pt education/ ADL training   PATIENT EDUCATION:  Education  details: Eval edu Discussed differential diagnoses for various swelling disorders. Provided basic level education regarding lymphatic structure and function, etiology, onset patterns, stages of progression, and prevention to limit infection risk, worsening condition and further functional decline. Pt edu for aught interaction between blood circulatory system and lymphatic circulation.Discussed  impact of gravity and co-morbidities on  lymphatic function. Outlined Complete Decongestive Therapy (CDT)  as standard of care and provided in depth information regarding 4 primary components of Intensive and Self Management Phases, including Manual Lymph Drainage (MLD), compression wrapping and garments, skin care, and therapeutic exercise. Dempsey discussion with re need for frequent attendance and high burden of care when caregiver is needed, impact of co morbidities. We discussed  the chronic, progressive nature of lymphedema and Importance of daily, ongoing LE self-care essential for limiting progression and infection risk.  Person educated: Patient  Education method: Explanation, Demonstration, and Handouts Education comprehension: verbalized understanding, returned demonstration, verbal cues required, and needs further education  LYMPHEDEMA SELF-CARE HOME PROGRAM: BLE lymphatic pumping there ex- 1 set of 10 each element, in order. Hold 5. 2 x daily  2. 23/7 hours daily, short stretch, knee length, multilayer compression bandages to one leg at a time only during Intensive Phase CDT  3. During self-management phase of CDT Custom-made gradient compression garments and HOS devices are medically necessary because they are uniquely sized and shaped to fit the exact dimensions of the affected extremities, and to provide appropriate medical grade, graduated compression essential for optimally managing chronic, progressive lymphedema. Multiple custom compression garments are needed to ensure proper hygiene to limit infection risk. Custom compression garments should be replaced q 3-6 months When worn consistently for optimal lymphedema self-management over time. HOS devices, medically necessary to limit fibrosis buildup in tissue, should be replaced q 2 years and PRN when worn out.     4. Daily skin care with low ph lotion matching skin ph  5.  Daily simple self MLD. Consider fitting Pt with the advanced, Tactile Medical Flexitouch sequential,  pneumatic , compression device for optimal lymphedema self care at home over time. [facility] research demonstrates that this device reduces infection risk of recurrent cellulitis.     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 She has a hx of slow healing wounds and recurrent cellulitis. Lymphedema limits her functional performance in all occupational domains, including basic and instrumental ADLs ( functional ambulation and mobility, LB dressing, productive activities and work, leisure pursuits, social participation and quality of life. Ms. Graddy will benefit from skilled Occupational Therapy for Complete Decongestive Therapy, (CDT), the gold standard protocol for lymphedema care. CDT includes manual lymphatic drainage, therapeutic lymphatic pumping exercise, skin care and compression therapies. The goals of CDT are to reduce limb swelling and associated pain,  to reduce infection risk and to limit lymphedema progression. Emphasis of Occupational Therapy throughout treatment course is on Pt education and self-care training for long term self management at home with OT support PRN. Without  OT for CDT lymphedema physical and sensory symptoms will progress and further functional decline is expected.`  OBJECTIVE IMPAIRMENTS: decreased knowledge of condition, decreased knowledge of use of DME, decreased mobility (impaired standing, walking and sitting tolerance) , increased chronic, progressive, edema and associated leg pain, impaired flexibility, increased infection risk.   ACTIVITY LIMITATIONS: functional mobility and ambulation (decreased standing, walking and dependent sitting tolerance < 15 minutes, , basic and instrumental ADLs  (LB dressing,  ability to fit preferred street shoes, shopping, home management, cooking, driving) , productive activities (limits ability to perform work duties requiring prolonged standing, sitting and  walking), leisure pursuits (limits ability to exercise), social participation (limits spending time with family and friends when prolonged standing, dependent sitting and/ or walking is required), quality of life ( self conscious about appearance of legs, disguises with clothing).  PERSONAL FACTORS: Time since onset of injury/illness/exacerbation, co morbidities (CVI, Obesity, HTN, OA), and occupational demands (standing, walking, dependent sitting) also patient's functional outcome.   REHAB POTENTIAL: Good  EVALUATION COMPLEXITY: Moderate   GOALS: Goals reviewed with patient? Yes   SHORT TERM GOALS: Target date: 4th OT Rx visit    Pt will demonstrate understanding of lymphedema precautions and prevention strategies with modified independence using a printed reference to identify at least 5 precautions and discussing how s/he may implement them into daily life to reduce risk of progression with modified assistance Baseline: max a Goal status: INITIAL   2.  With modified independence (extra time) Pt will be able to apply multilayer, knee length, compression wraps using gradient techniques to decrease limb volume, to limit infection risk, and to limit lymphedema progression.   Baseline: Dependent Goal status: INITIAL     LONG TERM GOALS: Target date: 02/15/24     Given this patient's Intake score of TBA % on the Lymphedema Life Impact Scale (LLIS), patient will experience a reduction of at least 5% in her perceived level of functional impairment resulting from lymphedema to improve functional performance and quality of life (QOL).Baseline: tbd Baseline: max a Goal status: INITIAL   2.  With modified independence (extra time and assistive devices) Pt will be able to don and doff appropriate compression garments and/or devices to control BLE lymphedema and to limit progression.  Baseline: Dependent Goal status: INITIAL   3. Pt will achieve at least a 10% volume reductions bilaterally  below the knees to return limb to more typical size and shape, to limit infection risk and LE progression, to decrease pain, to improve function, and to improve body image and QOL. Baseline: Dependent Goal status: INITIAL   5. Pt will achieve and sustain at least 85% attendance at OT sessions, and with compliance with all LE self-care home program components throughout CDT, including modified simple self-MLD, daily skin care and inspection, lymphatic pumping the ex and appropriate compression to limit lymphedema progression and to limit further functional decline. Baseline: Dependent Goal status: INITIAL PLAN:   OT FREQUENCY: 2 x/week   OT DURATION: 12 weeks and PRN   PLANNED INTERVENTIONS: CDT: Therapeutic exercise, MLD, skin care, simple self MLD, fit with appropriate compression garments/ devices, consider Flexitouch trial;  Therapeutic activity, Patient/Family education, Self Care, DME instructions,    PLAN FOR NEXT SESSION:  Anatomical measurements for BLE comparative limb volumetrics Pt EDU for LE self-care home program- There ex Compression wrap one leg given time  Zebedee Dec, MS, OTR/L, CLT-LANA 11/19/23 2:14 PM

## 2023-11-22 NOTE — Addendum Note (Signed)
 Addended by: MYRIAM ZEBEDEE CROME on: 11/22/2023 12:24 PM   Modules accepted: Orders

## 2023-12-13 ENCOUNTER — Ambulatory Visit: Attending: Student in an Organized Health Care Education/Training Program | Admitting: Occupational Therapy

## 2023-12-13 ENCOUNTER — Ambulatory Visit: Admitting: Occupational Therapy

## 2023-12-13 DIAGNOSIS — I890 Lymphedema, not elsewhere classified: Secondary | ICD-10-CM | POA: Diagnosis present

## 2023-12-13 NOTE — Therapy (Signed)
 OUTPATIENT OCCUPATIONAL THERAPY TREATMENT NOTE  BILATERAL LOWER EXTREMITY/ BILATERAL LOWER QUADRANT  LYMPHEDEMA  Patient Name: [name] MRN: [id number] DOB:October 16, 1955, 68 y.o., female Today's Date: 12/13/2023  END OF SESSION:   OT End of Session - 12/13/23 1314     Visit Number 2    Number of Visits 36    Date for Recertification  02/15/24    OT Start Time 0104    Activity Tolerance Patient tolerated treatment well;No increased pain    Behavior During Therapy [facility] for tasks assessed/performed            Past Medical History:  Diagnosis Date   Arthritis    C. difficile diarrhea    Heart attack (HCC) 10/24/2008   HLD (hyperlipidemia)    HTN (hypertension)    UC (ulcerative colitis) (HCC)    Past Surgical History:  Procedure Laterality Date   BIOPSY  10/13/2017   Procedure: BIOPSY;  Surgeon: Wilhelmenia Aloha Raddle., MD;  Location: [facility];  Service: Gastroenterology;;   CESAREAN SECTION     x 3   COLONOSCOPY WITH PROPOFOL  N/A 10/13/2017   Procedure: COLONOSCOPY WITH PROPOFOL ;  Surgeon: Wilhelmenia Aloha Raddle., MD;  Location: [facility];  Service: Gastroenterology;  Laterality: N/A;   TONSILLECTOMY     age 43   TOTAL KNEE ARTHROPLASTY Bilateral 10/22/2008   Patient Active Problem List   Diagnosis Date Noted   Dehydration    Other ulcerative colitis with rectal bleeding (HCC)    Ulcerative colitis, acute (HCC) 10/12/2017   History of myocardial infarction 10/12/2017   AKI (acute kidney injury) 10/12/2017   Hyperglycemia 10/12/2017   Hyperlipidemia 10/12/2017   Thrombocytosis 10/12/2017   Leukocytosis 10/12/2017    PCP: SULA How, MD  REFERRING PROVIDER: Vernell Farm, MD  REFERRING DIAG: I89.0  THERAPY DIAG:  Lymphedema  Rationale for Evaluation and Treatment: Rehabilitation  ONSET DATE: ~ 1/2 years ago- I would say July, in 2023.  SUBJECTIVE:                                                                                                                                                                                            SUBJECTIVE STATEMENT: [name] presents to Occupational Therapy for initial Rx visit. Pt reports average lymphedema related pain is 3/10. Pt has no new complaints since initial evaluation.   PERTINENT HISTORY: TAKES AMLODIPINE; arthritis, HTN, B TKA 2010, Hx mi 2010, CVI, Obesity, CAD, reports recurrent episodes of cellulitis  PAIN:  Are you having pain? Yes; legs and ankles 6/10, throbbing, pressure; improves with Tylenol , elevation, soaking my feet, rubbing; worsens with extended standing, walking and/ or  dependent sitting > 15 minutes  PRECAUTIONS: Other: LYMPHEDEMA  WEIGHT BEARING RESTRICTIONS: No  FALLS:  Has patient fallen in last 6 months? No  LIVING ENVIRONMENT: Lives with: lives alone Lives in: House/apartment Stairs: No; level entry Has following equipment at home: None  OCCUPATION: part time LPN; retired full time LPN  LEISURE: garden, flowers, plants, watching sports  HAND DOMINANCE: right   PRIOR LEVEL OF FUNCTION: Independent  PATIENT GOALS: to reduce leg swelling, associated pain, and keep them from getting worse. To learn about lymphedema   OBJECTIVE: Note: Objective measures were completed at Evaluation unless otherwise noted.  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 SURGERY TYPE/DATE: Non-cancer related INFECTIONS: recurrent cellulitis WOUNDS: +hx for L medial ankle ulcer 2/2 CVI  LYMPHEDEMA LIFE IMPACT SCALE (LLIS): 12/13/23 INITIAL: 29.41%   (The extent to which LE-related problems impacted your life in the last week.)  BLE COMPARATIVE LIMB VOLUMETRICS 12/13/23 INITIAL  LANDMARK RIGHT    R LEG (A-D) 3788.7 ml  R THIGH (E-G) ml  R FULL LIMB (A-G) ml   Limb Volume differential (LVD)  5.89%, R (Dom) > L  Volume change since initial %  Volume change overall V  (Blank rows = not tested)  LANDMARK LEFT    L LEG (A-D) 3566.4 ml  L THIGH (E-G) ml  L FULL LIMB (A-G) ml  Limb Volume differential (LVD)  %  Volume change since initial %  Volume change overall %  (Blank rows = not tested)    SKIN CONDITION/ TISSUE INTEGRITY:   Skin  Description Hyper Keratosis Peau d' Orange Shiny Tight Fibrotic/ Indurated Fatty/ doughy Lipo dermato sclerosis Spongy/ boggy   x x  x x  x x   Skin dry Flaky WNL Macerated   x x     Color Redness Varicosities Blanching Hemosiderin Stain Mottled   x x  x  x   Odor Malodorous Yeast Fungal infection  WNL      x   Temperature Warm Cool wnl    x     Pitting Edema   1+ 2+ 3+ 4+ Non-pitting         x   Girth Symmetrical Asymmetrical                   Distribution    L>R toes to groin    Stemmer Sign Positive Negative   +    Lymphorrhea History Of:  Present Absent   x  x    Wounds History Of Present Absent Venous Arterial Pressure Sheer   x  x x       Signs of Infection Odor Defined Border Erythema Acute Swelling Drainage                  Sensation Light Touch Deep pressure Hypersensitivity   In tact Impaired In tact Impaired Absent Impaired   x x x  x     Nails WNL   Fungus nail dystrophy   TBA     Hair Growth Symmetrical Asymmetrical   x    Skin Creases Base of toes  Ankles   Base of Fingers knees       Abdominal pannus Thigh Lobules  Face/ neck   x x  x       TREATMENT THIS  DATE:  BLE comparative limb volumetrics Multi layer compression wraps to L LEG  PATIENT EDUCATION:  Continued Pt/ CG edu for lymphedema self care home program throughout session. Topics include outcome of comparative limb volumetrics- starting limb volume differentials (LVDs), technology and gradient techniques used for short stretch, multilayer compression wrapping, simple self-MLD,  therapeutic lymphatic pumping exercises, skin/nail care, LE precautions, compression garment recommendations and specifications, wear and care schedule and compression garment donning / doffing w assistive devices. Discussed progress towards all OT goals since commencing CDT. Discussed detrimental impact of obesity on lower and upper extremity lymphedema over time. Reviewed OT goals for lymphedema care with Pt and discussed progress to date.  All questions answered to the Pt's satisfaction. Good return. Person educated: Patient  Education method: Explanation, Demonstration, and Handouts Education comprehension: verbalized understanding, returned demonstration, verbal cues required, and needs further education   LYMPHEDEMA SELF-CARE HOME PROGRAM: BLE lymphatic pumping there ex- 1 set of 10 each element, in order. Hold 5. 2 x daily  2. 23/7 hours daily, short stretch, knee length, multilayer compression bandages to one leg at a time only during Intensive Phase CDT  3. During self-management phase of CDT Custom-made gradient compression garments and HOS devices are medically necessary because they are uniquely sized and shaped to fit the exact dimensions of the affected extremities, and to provide appropriate medical grade, graduated compression essential for optimally managing chronic, progressive lymphedema. Multiple custom compression garments are needed to ensure proper hygiene to limit infection risk. Custom compression garments should be replaced q 3-6 months When worn consistently for optimal lymphedema self-management over time. HOS devices, medically necessary to limit fibrosis buildup in tissue, should be replaced q 2 years and PRN when worn out.     4. Daily skin care with low ph lotion matching skin ph  5.  Daily simple self MLD. Consider fitting Pt with the advanced, [employer] Flexitouch sequential, pneumatic , compression device for optimal lymphedema self care at home over time. [facility]  research demonstrates that this device reduces infection risk of recurrent cellulitis.     ASSESSMENT: CLINICAL IMPRESSION: Comparative limb volumetrics reveal limb volume differential measuring 5.89%, R leg (dominant) > L Leg. The LVD is relatively low and may be essentially in keeping with increased muscle mass on the dominant R side, however, both legs are similarly swollen. The LLE definitely presents with more obvious signs / symptoms of CVI. Commenced initial compression wrap below the L knee using gradient techniques. Pt educated throughout session to understand measurement outcomes and bandaging techniques. Productive session. Cont as per POC.   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 She has a hx of slow healing wounds and recurrent cellulitis. Lymphedema limits her functional performance in all occupational domains, including basic and instrumental ADLs ( functional ambulation and mobility, LB dressing, productive activities and work, leisure pursuits, social participation and quality of life. Ms. Bolls will benefit from skilled Occupational Therapy for Complete Decongestive Therapy, (CDT), the gold standard protocol for lymphedema care. CDT includes manual lymphatic drainage, therapeutic lymphatic pumping exercise, skin care and compression therapies. The goals of CDT are to reduce limb swelling and associated pain,  to reduce infection risk and to limit lymphedema progression. Emphasis of Occupational Therapy throughout treatment course is on Pt education and self-care training for long term self management at home with OT support PRN. Without  OT for CDT lymphedema physical and sensory symptoms  will progress and further functional decline is expected.)  OBJECTIVE IMPAIRMENTS: decreased knowledge of condition, decreased knowledge of use of DME, decreased mobility (impaired standing, walking and sitting tolerance) ,  increased chronic, progressive, edema and associated leg pain, impaired flexibility, increased infection risk.   ACTIVITY LIMITATIONS: functional mobility and ambulation (decreased standing, walking and dependent sitting tolerance < 15 minutes, , basic and instrumental ADLs  (LB dressing, ability to fit preferred street shoes, shopping, home management, cooking, driving) , productive activities (limits ability to perform work duties requiring prolonged standing, sitting and walking), leisure pursuits (limits ability to exercise), social participation (limits spending time with family and friends when prolonged standing, dependent sitting and/ or walking is required), quality of life ( self conscious about appearance of legs, disguises with clothing).  PERSONAL FACTORS: Time since onset of injury/illness/exacerbation, co morbidities (CVI, Obesity, HTN, OA), and occupational demands (standing, walking, dependent sitting) also patient's functional outcome.   REHAB POTENTIAL: Good  EVALUATION COMPLEXITY: Moderate   GOALS: Goals reviewed with patient? Yes   SHORT TERM GOALS: Target date: 4th OT Rx visit    Pt will demonstrate understanding of lymphedema precautions and prevention strategies with modified independence using a printed reference to identify at least 5 precautions and discussing how s/he may implement them into daily life to reduce risk of progression with modified assistance Baseline: max a Goal status: INITIAL   2.  With modified independence (extra time) Pt will be able to apply multilayer, knee length, compression wraps using gradient techniques to decrease limb volume, to limit infection risk, and to limit lymphedema progression.   Baseline: Dependent Goal status: INITIAL     LONG TERM GOALS: Target date: 02/15/24     Given this patient's Intake score of TBA % on the Lymphedema Life Impact Scale (LLIS), patient will experience a reduction of at least 5% in her perceived level  of functional impairment resulting from lymphedema to improve functional performance and quality of life (QOL).Baseline: tbd Baseline: max a Goal status: INITIAL   2.  With modified independence (extra time and assistive devices) Pt will be able to don and doff appropriate compression garments and/or devices to control BLE lymphedema and to limit progression.  Baseline: Dependent Goal status: INITIAL   3. Pt will achieve at least a 10% volume reductions bilaterally below the knees to return limb to more typical size and shape, to limit infection risk and LE progression, to decrease pain, to improve function, and to improve body image and QOL. Baseline: Dependent Goal status: INITIAL   5. Pt will achieve and sustain at least 85% attendance at OT sessions, and with compliance with all LE self-care home program components throughout CDT, including modified simple self-MLD, daily skin care and inspection, lymphatic pumping the ex and appropriate compression to limit lymphedema progression and to limit further functional decline. Baseline: Dependent Goal status: INITIAL PLAN:   OT FREQUENCY: 2 x/week   OT DURATION: 12 weeks and PRN   PLANNED INTERVENTIONS: CDT: Therapeutic exercise, MLD, skin care, simple self MLD, fit with appropriate compression garments/ devices, consider Flexitouch trial;  Therapeutic activity, Patient/Family education, Self Care, DME instructions,    PLAN FOR NEXT SESSION:  Anatomical measurements for BLE comparative limb volumetrics Pt EDU for LE self-care home program- There ex Compression wrap one leg given time  Zebedee Dec, MS, OTR/L, CLT-LANA 12/13/23 1:16 PM

## 2023-12-20 ENCOUNTER — Ambulatory Visit: Admitting: Occupational Therapy

## 2023-12-20 DIAGNOSIS — I890 Lymphedema, not elsewhere classified: Secondary | ICD-10-CM

## 2023-12-20 NOTE — Therapy (Signed)
 OUTPATIENT OCCUPATIONAL THERAPY TREATMENT NOTE  BILATERAL LOWER EXTREMITY/ BILATERAL LOWER QUADRANT  LYMPHEDEMA  Patient Name: [name] MRN: [id number] DOB:Jun 27, 1955, 68 y.o., female Today's Date: 12/20/2023  END OF SESSION:   OT End of Session - 12/20/23 0959     Visit Number 3    Number of Visits 36    Date for Recertification  02/15/24    Activity Tolerance Patient tolerated treatment well;No increased pain    Behavior During Therapy [facility] for tasks assessed/performed            Past Medical History:  Diagnosis Date   Arthritis    C. difficile diarrhea    Heart attack (HCC) 10/24/2008   HLD (hyperlipidemia)    HTN (hypertension)    UC (ulcerative colitis) (HCC)    Past Surgical History:  Procedure Laterality Date   BIOPSY  10/13/2017   Procedure: BIOPSY;  Surgeon: Wilhelmenia Aloha Raddle., MD;  Location: [facility];  Service: Gastroenterology;;   CESAREAN SECTION     x 3   COLONOSCOPY WITH PROPOFOL  N/A 10/13/2017   Procedure: COLONOSCOPY WITH PROPOFOL ;  Surgeon: Wilhelmenia Aloha Raddle., MD;  Location: [facility];  Service: Gastroenterology;  Laterality: N/A;   TONSILLECTOMY     age 53   TOTAL KNEE ARTHROPLASTY Bilateral 10/22/2008   Patient Active Problem List   Diagnosis Date Noted   Dehydration    Other ulcerative colitis with rectal bleeding (HCC)    Ulcerative colitis, acute (HCC) 10/12/2017   History of myocardial infarction 10/12/2017   AKI (acute kidney injury) 10/12/2017   Hyperglycemia 10/12/2017   Hyperlipidemia 10/12/2017   Thrombocytosis 10/12/2017   Leukocytosis 10/12/2017    PCP: SULA How, MD  REFERRING PROVIDER: Vernell Farm, MD  REFERRING DIAG: I89.0  THERAPY DIAG:  Lymphedema  Rationale for Evaluation and Treatment: Rehabilitation  ONSET DATE: ~ 1/2 years ago- I would say July, in 2023.  SUBJECTIVE:                                                                                                                                                                                            SUBJECTIVE STATEMENT: [name] presents to Occupational Therapy for initial Rx visit. Pt reports average lymphedema related pain is 3/10. Pt has no new complaints since initial evaluation.   PERTINENT HISTORY: TAKES AMLODIPINE; arthritis, HTN, B TKA 2010, Hx mi 2010, CVI, Obesity, CAD, reports recurrent episodes of cellulitis  PAIN:  Are you having pain? Yes; legs and ankles 6/10, throbbing, pressure; improves with Tylenol , elevation, soaking my feet, rubbing; worsens with extended standing, walking and/ or dependent sitting > 15 minutes  PRECAUTIONS:  Other: LYMPHEDEMA  WEIGHT BEARING RESTRICTIONS: No  FALLS:  Has patient fallen in last 6 months? No  LIVING ENVIRONMENT: Lives with: lives alone Lives in: House/apartment Stairs: No; level entry Has following equipment at home: None  OCCUPATION: part time LPN; retired full time LPN  LEISURE: garden, flowers, plants, watching sports  HAND DOMINANCE: right   PRIOR LEVEL OF FUNCTION: Independent  PATIENT GOALS: to reduce leg swelling, associated pain, and keep them from getting worse. To learn about lymphedema   OBJECTIVE: Note: Objective measures were completed at Evaluation unless otherwise noted.  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 SURGERY TYPE/DATE: Non-cancer related INFECTIONS: recurrent cellulitis WOUNDS: +hx for L medial ankle ulcer 2/2 CVI  LYMPHEDEMA LIFE IMPACT SCALE (LLIS): 12/13/23 INITIAL: 29.41%   (The extent to which LE-related problems impacted your life in the last week.)  BLE COMPARATIVE LIMB VOLUMETRICS 12/13/23 INITIAL  LANDMARK RIGHT    R LEG (A-D) 3788.7 ml  R THIGH (E-G) ml  R FULL LIMB (A-G) ml  Limb Volume  differential (LVD)  5.89%, R (Dom) > L  Volume change since initial %  Volume change overall V  (Blank rows = not tested)  LANDMARK LEFT    L LEG (A-D) 3566.4 ml  L THIGH (E-G) ml  L FULL LIMB (A-G) ml  Limb Volume differential (LVD)  %  Volume change since initial %  Volume change overall %  (Blank rows = not tested)    SKIN CONDITION/ TISSUE INTEGRITY:   Skin  Description Hyper Keratosis Peau d' Orange Shiny Tight Fibrotic/ Indurated Fatty/ doughy Lipo dermato sclerosis Spongy/ boggy   x x  x x  x x   Skin dry Flaky WNL Macerated   x x     Color Redness Varicosities Blanching Hemosiderin Stain Mottled   x x  x  x   Odor Malodorous Yeast Fungal infection  WNL      x   Temperature Warm Cool wnl    x     Pitting Edema   1+ 2+ 3+ 4+ Non-pitting         x   Girth Symmetrical Asymmetrical                   Distribution    L>R toes to groin    Stemmer Sign Positive Negative   +    Lymphorrhea History Of:  Present Absent   x  x    Wounds History Of Present Absent Venous Arterial Pressure Sheer   x  x x       Signs of Infection Odor Defined Border Erythema Acute Swelling Drainage                  Sensation Light Touch Deep pressure Hypersensitivity   In tact Impaired In tact Impaired Absent Impaired   x x x  x     Nails WNL   Fungus nail dystrophy   TBA     Hair Growth Symmetrical Asymmetrical   x    Skin Creases Base of toes  Ankles   Base of Fingers knees       Abdominal pannus Thigh Lobules  Face/ neck   x x  x       TREATMENT THIS DATE:  BLE comparative limb volumetrics Multi  layer compression wraps to L LEG  PATIENT EDUCATION:  Continued Pt/ CG edu for lymphedema self care home program throughout session. Topics include outcome of comparative limb volumetrics- starting limb volume differentials (LVDs), technology and gradient techniques used for short stretch, multilayer compression wrapping, simple self-MLD, therapeutic  lymphatic pumping exercises, skin/nail care, LE precautions, compression garment recommendations and specifications, wear and care schedule and compression garment donning / doffing w assistive devices. Discussed progress towards all OT goals since commencing CDT. Discussed detrimental impact of obesity on lower and upper extremity lymphedema over time. Reviewed OT goals for lymphedema care with Pt and discussed progress to date.  All questions answered to the Pt's satisfaction. Good return. Person educated: Patient  Education method: Explanation, Demonstration, and Handouts Education comprehension: verbalized understanding, returned demonstration, verbal cues required, and needs further education   LYMPHEDEMA SELF-CARE HOME PROGRAM: BLE lymphatic pumping there ex- 1 set of 10 each element, in order. Hold 5. 2 x daily  2. 23/7 hours daily, short stretch, knee length, multilayer compression bandages to one leg at a time only during Intensive Phase CDT  3. During self-management phase of CDT Custom-made gradient compression garments and HOS devices are medically necessary because they are uniquely sized and shaped to fit the exact dimensions of the affected extremities, and to provide appropriate medical grade, graduated compression essential for optimally managing chronic, progressive lymphedema. Multiple custom compression garments are needed to ensure proper hygiene to limit infection risk. Custom compression garments should be replaced q 3-6 months When worn consistently for optimal lymphedema self-management over time. HOS devices, medically necessary to limit fibrosis buildup in tissue, should be replaced q 2 years and PRN when worn out.     4. Daily skin care with low ph lotion matching skin ph  5.  Daily simple self MLD. Consider fitting Pt with the advanced, [employer] Flexitouch sequential, pneumatic , compression device for optimal lymphedema self care at home over time. [facility] research  demonstrates that this device reduces infection risk of recurrent cellulitis.     ASSESSMENT: CLINICAL IMPRESSION: Comparative limb volumetrics reveal limb volume differential measuring 5.89%, R leg (dominant) > L Leg. The LVD is relatively low and may be essentially in keeping with increased muscle mass on the dominant R side, however, both legs are similarly swollen. The LLE definitely presents with more obvious signs / symptoms of CVI. Commenced initial compression wrap below the L knee using gradient techniques. Pt educated throughout session to understand measurement outcomes and bandaging techniques. Productive session. Cont as per POC.   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 She has a hx of slow healing wounds and recurrent cellulitis. Lymphedema limits her functional performance in all occupational domains, including basic and instrumental ADLs ( functional ambulation and mobility, LB dressing, productive activities and work, leisure pursuits, social participation and quality of life. Ms. Covino will benefit from skilled Occupational Therapy for Complete Decongestive Therapy, (CDT), the gold standard protocol for lymphedema care. CDT includes manual lymphatic drainage, therapeutic lymphatic pumping exercise, skin care and compression therapies. The goals of CDT are to reduce limb swelling and associated pain,  to reduce infection risk and to limit lymphedema progression. Emphasis of Occupational Therapy throughout treatment course is on Pt education and self-care training for long term self management at home with OT support PRN. Without  OT for CDT lymphedema physical and sensory symptoms will progress and further functional decline is  expected.)  OBJECTIVE IMPAIRMENTS: decreased knowledge of condition, decreased knowledge of use of DME, decreased mobility (impaired standing, walking and sitting tolerance) , increased  chronic, progressive, edema and associated leg pain, impaired flexibility, increased infection risk.   ACTIVITY LIMITATIONS: functional mobility and ambulation (decreased standing, walking and dependent sitting tolerance < 15 minutes, , basic and instrumental ADLs  (LB dressing, ability to fit preferred street shoes, shopping, home management, cooking, driving) , productive activities (limits ability to perform work duties requiring prolonged standing, sitting and walking), leisure pursuits (limits ability to exercise), social participation (limits spending time with family and friends when prolonged standing, dependent sitting and/ or walking is required), quality of life ( self conscious about appearance of legs, disguises with clothing).  PERSONAL FACTORS: Time since onset of injury/illness/exacerbation, co morbidities (CVI, Obesity, HTN, OA), and occupational demands (standing, walking, dependent sitting) also patient's functional outcome.   REHAB POTENTIAL: Good  EVALUATION COMPLEXITY: Moderate   GOALS: Goals reviewed with patient? Yes   SHORT TERM GOALS: Target date: 4th OT Rx visit    Pt will demonstrate understanding of lymphedema precautions and prevention strategies with modified independence using a printed reference to identify at least 5 precautions and discussing how s/he may implement them into daily life to reduce risk of progression with modified assistance Baseline: max a Goal status: INITIAL   2.  With modified independence (extra time) Pt will be able to apply multilayer, knee length, compression wraps using gradient techniques to decrease limb volume, to limit infection risk, and to limit lymphedema progression.   Baseline: Dependent Goal status: INITIAL     LONG TERM GOALS: Target date: 02/15/24     Given this patient's Intake score of TBA % on the Lymphedema Life Impact Scale (LLIS), patient will experience a reduction of at least 5% in her perceived level of  functional impairment resulting from lymphedema to improve functional performance and quality of life (QOL).Baseline: tbd Baseline: max a Goal status: INITIAL   2.  With modified independence (extra time and assistive devices) Pt will be able to don and doff appropriate compression garments and/or devices to control BLE lymphedema and to limit progression.  Baseline: Dependent Goal status: INITIAL   3. Pt will achieve at least a 10% volume reductions bilaterally below the knees to return limb to more typical size and shape, to limit infection risk and LE progression, to decrease pain, to improve function, and to improve body image and QOL. Baseline: Dependent Goal status: INITIAL   5. Pt will achieve and sustain at least 85% attendance at OT sessions, and with compliance with all LE self-care home program components throughout CDT, including modified simple self-MLD, daily skin care and inspection, lymphatic pumping the ex and appropriate compression to limit lymphedema progression and to limit further functional decline. Baseline: Dependent Goal status: INITIAL PLAN:   OT FREQUENCY: 2 x/week   OT DURATION: 12 weeks and PRN   PLANNED INTERVENTIONS: CDT: Therapeutic exercise, MLD, skin care, simple self MLD, fit with appropriate compression garments/ devices, consider Flexitouch trial;  Therapeutic activity, Patient/Family education, Self Care, DME instructions,    PLAN FOR NEXT SESSION:  Anatomical measurements for BLE comparative limb volumetrics Pt EDU for LE self-care home program- There ex Compression wrap one leg given time  Zebedee Dec, MS, OTR/L, CLT-LANA 12/20/23 10:00 AM

## 2023-12-22 ENCOUNTER — Ambulatory Visit: Admitting: Occupational Therapy

## 2023-12-27 ENCOUNTER — Ambulatory Visit: Attending: Student in an Organized Health Care Education/Training Program | Admitting: Occupational Therapy

## 2023-12-27 DIAGNOSIS — I890 Lymphedema, not elsewhere classified: Secondary | ICD-10-CM | POA: Diagnosis present

## 2023-12-27 NOTE — Therapy (Signed)
 OUTPATIENT OCCUPATIONAL THERAPY TREATMENT NOTE  BILATERAL LOWER EXTREMITY/ BILATERAL LOWER QUADRANT  LYMPHEDEMA  Patient Name: [name] MRN: [id number] DOB:11-04-1955, 68 y.o., female Today's Date: 12/27/2023  END OF SESSION:   OT End of Session - 12/27/23 0808     Visit Number 4    Number of Visits 36    Date for Recertification  02/15/24    OT Start Time 0803    OT Stop Time 0908    OT Time Calculation (min) 65 min    Activity Tolerance Patient tolerated treatment well;No increased pain    Behavior During Therapy [facility] for tasks assessed/performed            Past Medical History:  Diagnosis Date   Arthritis    C. difficile diarrhea    Heart attack (HCC) 10/24/2008   HLD (hyperlipidemia)    HTN (hypertension)    UC (ulcerative colitis) (HCC)    Past Surgical History:  Procedure Laterality Date   BIOPSY  10/13/2017   Procedure: BIOPSY;  Surgeon: Wilhelmenia Aloha Raddle., MD;  Location: [facility];  Service: Gastroenterology;;   CESAREAN SECTION     x 3   COLONOSCOPY WITH PROPOFOL  N/A 10/13/2017   Procedure: COLONOSCOPY WITH PROPOFOL ;  Surgeon: Wilhelmenia Aloha Raddle., MD;  Location: [facility];  Service: Gastroenterology;  Laterality: N/A;   TONSILLECTOMY     age 25   TOTAL KNEE ARTHROPLASTY Bilateral 10/22/2008   Patient Active Problem List   Diagnosis Date Noted   Dehydration    Other ulcerative colitis with rectal bleeding (HCC)    Ulcerative colitis, acute (HCC) 10/12/2017   History of myocardial infarction 10/12/2017   AKI (acute kidney injury) 10/12/2017   Hyperglycemia 10/12/2017   Hyperlipidemia 10/12/2017   Thrombocytosis 10/12/2017   Leukocytosis 10/12/2017    PCP: SULA How, MD  REFERRING PROVIDER: Vernell Farm, MD  REFERRING DIAG: I89.0  THERAPY DIAG:  Lymphedema  Rationale for Evaluation and Treatment: Rehabilitation  ONSET DATE: ~ 1/2 years ago- I would say July, in 2023.  SUBJECTIVE:                                                                                                                                                                                            SUBJECTIVE STATEMENT: Khalessi Blough presents to Occupational Therapy for initial Rx visit. Pt reports lymphedema related pain in the LLE is absent this morning. I wrapped between visits. It feels wonderful. Pt denies other concerns this morning.   PERTINENT HISTORY: TAKES AMLODIPINE; arthritis, HTN, B TKA 2010, Hx mi 2010, CVI, Obesity, CAD, reports recurrent episodes of cellulitis  PAIN:  Are  you having pain? No LLE pain; RLE pain not rated numerically throbbing, pressure improves with Tylenol , elevation, soaking my feet, rubbing worsens with extended standing, walking and/ or dependent sitting > 15 minutes  PRECAUTIONS: Other: LYMPHEDEMA  WEIGHT BEARING RESTRICTIONS: No  FALLS:  Has patient fallen in last 6 months? No  LIVING ENVIRONMENT: Lives with: lives alone Lives in: House/apartment Stairs: No; level entry Has following equipment at home: None  OCCUPATION: part time LPN; retired full time LPN  LEISURE: garden, flowers, plants, watching sports  HAND DOMINANCE: right   PRIOR LEVEL OF FUNCTION: Independent  PATIENT GOALS: to reduce leg swelling, associated pain, and keep them from getting worse. To learn about lymphedema   OBJECTIVE: Note: Objective measures were completed at Evaluation unless otherwise noted.  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 SURGERY TYPE/DATE: Non-cancer related INFECTIONS: recurrent cellulitis WOUNDS: +hx for L medial ankle ulcer 2/2 CVI  LYMPHEDEMA LIFE IMPACT SCALE (LLIS): 12/13/23 INITIAL: 29.41%   (The extent to which LE-related problems impacted your life in the last week.)  BLE  COMPARATIVE LIMB VOLUMETRICS 12/13/23 INITIAL  LANDMARK RIGHT    R LEG (A-D) 3788.7 ml  R THIGH (E-G) ml  R FULL LIMB (A-G) ml  Limb Volume differential (LVD)  5.89%, R (Dom) > L  Volume change since initial %  Volume change overall V  (Blank rows = not tested)  LANDMARK LEFT    L LEG (A-D) 3566.4 ml  L THIGH (E-G) ml  L FULL LIMB (A-G) ml  Limb Volume differential (LVD)  %  Volume change since initial %  Volume change overall %  (Blank rows = not tested)    SKIN CONDITION/ TISSUE INTEGRITY:   Skin  Description Hyper Keratosis Peau d' Orange Shiny Tight Fibrotic/ Indurated Fatty/ doughy Lipo dermato sclerosis Spongy/ boggy   x x  x x  x x   Skin dry Flaky WNL Macerated   x x     Color Redness Varicosities Blanching Hemosiderin Stain Mottled   x x  x  x   Odor Malodorous Yeast Fungal infection  WNL      x   Temperature Warm Cool wnl    x     Pitting Edema   1+ 2+ 3+ 4+ Non-pitting         x   Girth Symmetrical Asymmetrical                   Distribution    L>R toes to groin    Stemmer Sign Positive Negative   +    Lymphorrhea History Of:  Present Absent   x  x    Wounds History Of Present Absent Venous Arterial Pressure Sheer   x  x x       Signs of Infection Odor Defined Border Erythema Acute Swelling Drainage                  Sensation Light Touch Deep pressure Hypersensitivity   In tact Impaired In tact Impaired Absent Impaired   x x x  x     Nails WNL   Fungus nail dystrophy   TBA     Hair Growth Symmetrical Asymmetrical   x    Skin Creases Base of toes  Ankles   Base of Fingers  knees       Abdominal pannus Thigh Lobules  Face/ neck   x x  x       TREATMENT THIS DATE:  BLE comparative limb volumetrics Multi layer compression wraps to L LEG  PATIENT EDUCATION:  Continued Pt/ CG edu for lymphedema self care home program throughout session. Topics include outcome of comparative limb volumetrics- starting limb volume  differentials (LVDs), technology and gradient techniques used for short stretch, multilayer compression wrapping, simple self-MLD, therapeutic lymphatic pumping exercises, skin/nail care, LE precautions, compression garment recommendations and specifications, wear and care schedule and compression garment donning / doffing w assistive devices. Discussed progress towards all OT goals since commencing CDT. Discussed detrimental impact of obesity on lower and upper extremity lymphedema over time. Reviewed OT goals for lymphedema care with Pt and discussed progress to date.  All questions answered to the Pt's satisfaction. Good return. Person educated: Patient  Education method: Explanation, Demonstration, and Handouts Education comprehension: verbalized understanding, returned demonstration, verbal cues required, and needs further education   LYMPHEDEMA SELF-CARE HOME PROGRAM: BLE lymphatic pumping there ex- 1 set of 10 each element, in order. Hold 5. 2 x daily  2. 23/7 hours daily, short stretch, knee length, multilayer compression bandages to one leg at a time only during Intensive Phase CDT  3. During self-management phase of CDT Custom-made gradient compression garments and HOS devices are medically necessary because they are uniquely sized and shaped to fit the exact dimensions of the affected extremities, and to provide appropriate medical grade, graduated compression essential for optimally managing chronic, progressive lymphedema. Multiple custom compression garments are needed to ensure proper hygiene to limit infection risk. Custom compression garments should be replaced q 3-6 months When worn consistently for optimal lymphedema self-management over time. HOS devices, medically necessary to limit fibrosis buildup in tissue, should be replaced q 2 years and PRN when worn out.     4. Daily skin care with low ph lotion matching skin ph  5.  Daily simple self MLD. Consider fitting Pt with the  advanced, [employer] Flexitouch sequential, pneumatic , compression device for optimal lymphedema self care at home over time. [facility] research demonstrates that this device reduces infection risk of recurrent cellulitis.     ASSESSMENT: CLINICAL IMPRESSION: Pt reporting LLE lymphedema-related pain is reduced below the knee since commencing OT for CDT. Pt tolerated MLD to LLE/ LLQ utilizing functional inguinal watershed. After skilled teaching Pt mastered J stroke and is able to perform short neck sequence with moderate assistance. Applied multilayer compression wraps from base of toes to below the knee on the L Leg as established using gradient techniques. Reviewed bandaging techniques while applying them today. Productive session. Cont as per POC.   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 She has a hx of slow healing wounds and recurrent cellulitis. Lymphedema limits her functional performance in all occupational domains, including basic and instrumental ADLs ( functional ambulation and mobility, LB dressing, productive activities and work, leisure pursuits, social participation and quality of life. Ms. Hannula will benefit from skilled Occupational Therapy for Complete Decongestive Therapy, (CDT), the gold standard protocol for lymphedema care. CDT includes manual lymphatic drainage, therapeutic lymphatic pumping exercise, skin care and compression therapies. The goals of CDT are to reduce limb swelling and associated pain,  to reduce infection risk and to limit lymphedema progression. Emphasis of Occupational Therapy throughout treatment course is on Pt education  and self-care training for long term self management at home with OT support PRN. Without  OT for CDT lymphedema physical and sensory symptoms will progress and further functional decline is expected.)  OBJECTIVE IMPAIRMENTS: decreased knowledge of condition, decreased  knowledge of use of DME, decreased mobility (impaired standing, walking and sitting tolerance) , increased chronic, progressive, edema and associated leg pain, impaired flexibility, increased infection risk.   ACTIVITY LIMITATIONS: functional mobility and ambulation (decreased standing, walking and dependent sitting tolerance < 15 minutes, , basic and instrumental ADLs  (LB dressing, ability to fit preferred street shoes, shopping, home management, cooking, driving) , productive activities (limits ability to perform work duties requiring prolonged standing, sitting and walking), leisure pursuits (limits ability to exercise), social participation (limits spending time with family and friends when prolonged standing, dependent sitting and/ or walking is required), quality of life ( self conscious about appearance of legs, disguises with clothing).  PERSONAL FACTORS: Time since onset of injury/illness/exacerbation, co morbidities (CVI, Obesity, HTN, OA), and occupational demands (standing, walking, dependent sitting) also patient's functional outcome.   REHAB POTENTIAL: Good  EVALUATION COMPLEXITY: Moderate   GOALS: Goals reviewed with patient? Yes   SHORT TERM GOALS: Target date: 4th OT Rx visit    Pt will demonstrate understanding of lymphedema precautions and prevention strategies with modified independence using a printed reference to identify at least 5 precautions and discussing how s/he may implement them into daily life to reduce risk of progression with modified assistance Baseline: max a Goal status: INITIAL   2.  With modified independence (extra time) Pt will be able to apply multilayer, knee length, compression wraps using gradient techniques to decrease limb volume, to limit infection risk, and to limit lymphedema progression.   Baseline: Dependent Goal status: INITIAL     LONG TERM GOALS: Target date: 02/15/24     Given this patient's Intake score of TBA % on the Lymphedema  Life Impact Scale (LLIS), patient will experience a reduction of at least 5% in her perceived level of functional impairment resulting from lymphedema to improve functional performance and quality of life (QOL).Baseline: tbd Baseline: max a Goal status: INITIAL   2.  With modified independence (extra time and assistive devices) Pt will be able to don and doff appropriate compression garments and/or devices to control BLE lymphedema and to limit progression.  Baseline: Dependent Goal status: INITIAL   3. Pt will achieve at least a 10% volume reductions bilaterally below the knees to return limb to more typical size and shape, to limit infection risk and LE progression, to decrease pain, to improve function, and to improve body image and QOL. Baseline: Dependent Goal status: INITIAL   5. Pt will achieve and sustain at least 85% attendance at OT sessions, and with compliance with all LE self-care home program components throughout CDT, including modified simple self-MLD, daily skin care and inspection, lymphatic pumping the ex and appropriate compression to limit lymphedema progression and to limit further functional decline. Baseline: Dependent Goal status: INITIAL PLAN:   OT FREQUENCY: 2 x/week   OT DURATION: 12 weeks and PRN   PLANNED INTERVENTIONS: CDT: Therapeutic exercise, MLD, skin care, simple self MLD, fit with appropriate compression garments/ devices, consider Flexitouch trial;  Therapeutic activity, Patient/Family education, Self Care, DME instructions,    PLAN FOR NEXT SESSION:  Anatomical measurements for BLE comparative limb volumetrics Pt EDU for LE self-care home program- There ex Compression wrap one leg given time  Zebedee Dec, MS, OTR/L, CLT-LANA 12/27/23 9:17  AM

## 2023-12-29 ENCOUNTER — Ambulatory Visit: Admitting: Occupational Therapy

## 2024-01-03 ENCOUNTER — Ambulatory Visit: Admitting: Occupational Therapy

## 2024-01-04 ENCOUNTER — Ambulatory Visit: Admitting: Occupational Therapy

## 2024-01-06 ENCOUNTER — Ambulatory Visit: Admitting: Occupational Therapy

## 2024-01-06 ENCOUNTER — Encounter: Payer: Self-pay | Admitting: Occupational Therapy

## 2024-01-06 DIAGNOSIS — I890 Lymphedema, not elsewhere classified: Secondary | ICD-10-CM | POA: Diagnosis not present

## 2024-01-06 NOTE — Therapy (Signed)
 OUTPATIENT OCCUPATIONAL THERAPY TREATMENT NOTE  BILATERAL LOWER EXTREMITY/ BILATERAL LOWER QUADRANT  LYMPHEDEMA  Patient Name: [name] MRN: [id number] DOB:1955/08/23, 68 y.o., female Today's Date: 01/06/2024  END OF SESSION:   OT End of Session - 01/06/24 1411     Visit Number 5    Number of Visits 36    Date for Recertification  02/15/24    OT Start Time 0203    OT Stop Time 0300    OT Time Calculation (min) 57 min    Activity Tolerance Patient tolerated treatment well;No increased pain    Behavior During Therapy [facility] for tasks assessed/performed            Past Medical History:  Diagnosis Date   Arthritis    C. difficile diarrhea    Heart attack (HCC) 10/24/2008   HLD (hyperlipidemia)    HTN (hypertension)    UC (ulcerative colitis) (HCC)    Past Surgical History:  Procedure Laterality Date   BIOPSY  10/13/2017   Procedure: BIOPSY;  Surgeon: Wilhelmenia Aloha Raddle., MD;  Location: [facility];  Service: Gastroenterology;;   CESAREAN SECTION     x 3   COLONOSCOPY WITH PROPOFOL  N/A 10/13/2017   Procedure: COLONOSCOPY WITH PROPOFOL ;  Surgeon: Wilhelmenia Aloha Raddle., MD;  Location: [facility];  Service: Gastroenterology;  Laterality: N/A;   TONSILLECTOMY     age 26   TOTAL KNEE ARTHROPLASTY Bilateral 10/22/2008   Patient Active Problem List   Diagnosis Date Noted   Dehydration    Other ulcerative colitis with rectal bleeding (HCC)    Ulcerative colitis, acute (HCC) 10/12/2017   History of myocardial infarction 10/12/2017   AKI (acute kidney injury) 10/12/2017   Hyperglycemia 10/12/2017   Hyperlipidemia 10/12/2017   Thrombocytosis 10/12/2017   Leukocytosis 10/12/2017    PCP: SULA How, MD  REFERRING PROVIDER: Vernell Farm, MD  REFERRING DIAG: I89.0  THERAPY DIAG:  Lymphedema  Rationale for Evaluation and Treatment: Rehabilitation  ONSET DATE: ~ 1/2 years ago- I would say July, in 2023.  SUBJECTIVE:                                                                                                                                                                                            SUBJECTIVE STATEMENT: [name] presents to Occupational Therapy for initial Rx visit. Pt denies LE related pain today. Pt reports she is wrapping compression between visits as directed. I don't wear them for more that 24 hours. Pt complains of pain in R bunion. Pt reports Norvasc was changed due to swelling side effects and she is no longer taking it.  PERTINENT  HISTORY: TAKES AMLODIPINE; arthritis, HTN, B TKA 2010, Hx mi 2010, CVI, Obesity, CAD, reports recurrent episodes of cellulitis  PAIN:  Are you having pain? No LLE pain; RLE pain not rated numerically throbbing, pressure improves with Tylenol , elevation, soaking my feet, rubbing worsens with extended standing, walking and/ or dependent sitting > 15 minutes  PRECAUTIONS: Other: LYMPHEDEMA  WEIGHT BEARING RESTRICTIONS: No  FALLS:  Has patient fallen in last 6 months? No  LIVING ENVIRONMENT: Lives with: lives alone Lives in: House/apartment Stairs: No; level entry Has following equipment at home: None  OCCUPATION: part time LPN; retired full time LPN  LEISURE: garden, flowers, plants, watching sports  HAND DOMINANCE: right   PRIOR LEVEL OF FUNCTION: Independent  PATIENT GOALS: to reduce leg swelling, associated pain, and keep them from getting worse. To learn about lymphedema   OBJECTIVE: Note: Objective measures were completed at Evaluation unless otherwise noted.  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 SURGERY TYPE/DATE: Non-cancer related INFECTIONS: recurrent cellulitis WOUNDS: +hx for L medial ankle ulcer 2/2 CVI  LYMPHEDEMA LIFE IMPACT SCALE (LLIS):  12/13/23 INITIAL: 29.41%   (The extent to which LE-related problems impacted your life in the last week.)  BLE COMPARATIVE LIMB VOLUMETRICS 12/13/23 INITIAL  LANDMARK RIGHT    R LEG (A-D) 3788.7 ml  R THIGH (E-G) ml  R FULL LIMB (A-G) ml  Limb Volume differential (LVD)  5.89%, R (Dom) > L  Volume change since initial %  Volume change overall V  (Blank rows = not tested)  LANDMARK LEFT    L LEG (A-D) 3566.4 ml  L THIGH (E-G) ml  L FULL LIMB (A-G) ml  Limb Volume differential (LVD)  %  Volume change since initial %  Volume change overall %  (Blank rows = not tested)    SKIN CONDITION/ TISSUE INTEGRITY:   Skin  Description Hyper Keratosis Peau d' Orange Shiny Tight Fibrotic/ Indurated Fatty/ doughy Lipo dermato sclerosis Spongy/ boggy   x x  x x  x x   Skin dry Flaky WNL Macerated   x x     Color Redness Varicosities Blanching Hemosiderin Stain Mottled   x x  x  x   Odor Malodorous Yeast Fungal infection  WNL      x   Temperature Warm Cool wnl    x     Pitting Edema   1+ 2+ 3+ 4+ Non-pitting         x   Girth Symmetrical Asymmetrical                   Distribution    L>R toes to groin    Stemmer Sign Positive Negative   +    Lymphorrhea History Of:  Present Absent   x  x    Wounds History Of Present Absent Venous Arterial Pressure Sheer   x  x x       Signs of Infection Odor Defined Border Erythema Acute Swelling Drainage                  Sensation Light Touch Deep pressure Hypersensitivity   In tact Impaired In tact Impaired Absent Impaired   x x x  x     Nails WNL   Fungus nail dystrophy   TBA  Hair Growth Symmetrical Asymmetrical   x    Skin Creases Base of toes  Ankles   Base of Fingers knees       Abdominal pannus Thigh Lobules  Face/ neck   x x  x       TREATMENT THIS DATE:  BLE comparative limb volumetrics Multi layer compression wraps to L LEG  PATIENT EDUCATION:  Continued Pt/ CG edu for lymphedema self  care home program throughout session. Topics include outcome of comparative limb volumetrics- starting limb volume differentials (LVDs), technology and gradient techniques used for short stretch, multilayer compression wrapping, simple self-MLD, therapeutic lymphatic pumping exercises, skin/nail care, LE precautions, compression garment recommendations and specifications, wear and care schedule and compression garment donning / doffing w assistive devices. Discussed progress towards all OT goals since commencing CDT. Discussed detrimental impact of obesity on lower and upper extremity lymphedema over time. Reviewed OT goals for lymphedema care with Pt and discussed progress to date.  All questions answered to the Pt's satisfaction. Good return. Person educated: Patient  Education method: Explanation, Demonstration, and Handouts Education comprehension: verbalized understanding, returned demonstration, verbal cues required, and needs further education   LYMPHEDEMA SELF-CARE HOME PROGRAM: BLE lymphatic pumping there ex- 1 set of 10 each element, in order. Hold 5. 2 x daily  2. 23/7 hours daily, short stretch, knee length, multilayer compression bandages to one leg at a time only during Intensive Phase CDT  3. During self-management phase of CDT Custom-made gradient compression garments and HOS devices are medically necessary because they are uniquely sized and shaped to fit the exact dimensions of the affected extremities, and to provide appropriate medical grade, graduated compression essential for optimally managing chronic, progressive lymphedema. Multiple custom compression garments are needed to ensure proper hygiene to limit infection risk. Custom compression garments should be replaced q 3-6 months When worn consistently for optimal lymphedema self-management over time. HOS devices, medically necessary to limit fibrosis buildup in tissue, should be replaced q 2 years and PRN when worn out.     4.  Daily skin care with low ph lotion matching skin ph  5.  Daily simple self MLD. Consider fitting Pt with the advanced, [employer] Flexitouch sequential, pneumatic , compression device for optimal lymphedema self care at home over time. [facility] research demonstrates that this device reduces infection risk of recurrent cellulitis.     ASSESSMENT: CLINICAL IMPRESSION: Pt reporting LLE lymphedema-related pain is reduced below the knee since commencing OT for CDT. Pt tolerated MLD to LLE/ LLQ utilizing functional inguinal watershed. After skilled teaching Pt mastered J stroke and is able to perform short neck sequence with moderate assistance. Applied multilayer compression wraps from base of toes to below the knee on the L Leg as established using gradient techniques. Reviewed bandaging techniques while applying them today. Productive session. Cont as per POC.   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 She has a hx of slow healing wounds and recurrent cellulitis. Lymphedema limits her functional performance in all occupational domains, including basic and instrumental ADLs ( functional ambulation and mobility, LB dressing, productive activities and work, leisure pursuits, social participation and quality of life. Ms. Aronov will benefit from skilled Occupational Therapy for Complete Decongestive Therapy, (CDT), the gold standard protocol for lymphedema care. CDT includes manual lymphatic drainage, therapeutic lymphatic pumping exercise, skin care and compression therapies. The goals of CDT are to reduce limb swelling and associated  pain,  to reduce infection risk and to limit lymphedema progression. Emphasis of Occupational Therapy throughout treatment course is on Pt education and self-care training for long term self management at home with OT support PRN. Without  OT for CDT lymphedema physical and sensory symptoms will progress and  further functional decline is expected.)  OBJECTIVE IMPAIRMENTS: decreased knowledge of condition, decreased knowledge of use of DME, decreased mobility (impaired standing, walking and sitting tolerance) , increased chronic, progressive, edema and associated leg pain, impaired flexibility, increased infection risk.   ACTIVITY LIMITATIONS: functional mobility and ambulation (decreased standing, walking and dependent sitting tolerance < 15 minutes, , basic and instrumental ADLs  (LB dressing, ability to fit preferred street shoes, shopping, home management, cooking, driving) , productive activities (limits ability to perform work duties requiring prolonged standing, sitting and walking), leisure pursuits (limits ability to exercise), social participation (limits spending time with family and friends when prolonged standing, dependent sitting and/ or walking is required), quality of life ( self conscious about appearance of legs, disguises with clothing).  PERSONAL FACTORS: Time since onset of injury/illness/exacerbation, co morbidities (CVI, Obesity, HTN, OA), and occupational demands (standing, walking, dependent sitting) also patient's functional outcome.   REHAB POTENTIAL: Good  EVALUATION COMPLEXITY: Moderate   GOALS: Goals reviewed with patient? Yes   SHORT TERM GOALS: Target date: 4th OT Rx visit    Pt will demonstrate understanding of lymphedema precautions and prevention strategies with modified independence using a printed reference to identify at least 5 precautions and discussing how s/he may implement them into daily life to reduce risk of progression with modified assistance Baseline: max a Goal status: INITIAL   2.  With modified independence (extra time) Pt will be able to apply multilayer, knee length, compression wraps using gradient techniques to decrease limb volume, to limit infection risk, and to limit lymphedema progression.   Baseline: Dependent Goal status: INITIAL      LONG TERM GOALS: Target date: 02/15/24     Given this patient's Intake score of TBA % on the Lymphedema Life Impact Scale (LLIS), patient will experience a reduction of at least 5% in her perceived level of functional impairment resulting from lymphedema to improve functional performance and quality of life (QOL).Baseline: tbd Baseline: max a Goal status: INITIAL   2.  With modified independence (extra time and assistive devices) Pt will be able to don and doff appropriate compression garments and/or devices to control BLE lymphedema and to limit progression.  Baseline: Dependent Goal status: INITIAL   3. Pt will achieve at least a 10% volume reductions bilaterally below the knees to return limb to more typical size and shape, to limit infection risk and LE progression, to decrease pain, to improve function, and to improve body image and QOL. Baseline: Dependent Goal status: INITIAL   5. Pt will achieve and sustain at least 85% attendance at OT sessions, and with compliance with all LE self-care home program components throughout CDT, including modified simple self-MLD, daily skin care and inspection, lymphatic pumping the ex and appropriate compression to limit lymphedema progression and to limit further functional decline. Baseline: Dependent Goal status: INITIAL PLAN:   OT FREQUENCY: 2 x/week   OT DURATION: 12 weeks and PRN   PLANNED INTERVENTIONS: CDT: Therapeutic exercise, MLD, skin care, simple self MLD, fit with appropriate compression garments/ devices, consider Flexitouch trial;  Therapeutic activity, Patient/Family education, Self Care, DME instructions,    PLAN FOR NEXT SESSION:  Anatomical measurements for BLE comparative limb volumetrics Pt  EDU for LE self-care home program- There ex Compression wrap one leg given time  Zebedee Dec, MS, OTR/L, CLT-LANA 01/06/24 3:02 PM

## 2024-01-10 ENCOUNTER — Encounter: Payer: Self-pay | Admitting: Occupational Therapy

## 2024-01-10 ENCOUNTER — Ambulatory Visit: Admitting: Occupational Therapy

## 2024-01-10 DIAGNOSIS — I890 Lymphedema, not elsewhere classified: Secondary | ICD-10-CM

## 2024-01-10 NOTE — Therapy (Signed)
 OUTPATIENT OCCUPATIONAL THERAPY TREATMENT NOTE  BILATERAL LOWER EXTREMITY/ BILATERAL LOWER QUADRANT  LYMPHEDEMA  Patient Name: [name] MRN: [id number] DOB:1955-08-05, 68 y.o., female Today's Date: 01/10/2024  END OF SESSION:   OT End of Session - 01/10/24 0819     Visit Number 6    Number of Visits 36    Date for Recertification  02/15/24    OT Start Time 0812    OT Stop Time 0906    OT Time Calculation (min) 54 min    Activity Tolerance Patient tolerated treatment well;No increased pain    Behavior During Therapy [facility] for tasks assessed/performed            Past Medical History:  Diagnosis Date   Arthritis    C. difficile diarrhea    Heart attack (HCC) 10/24/2008   HLD (hyperlipidemia)    HTN (hypertension)    UC (ulcerative colitis) (HCC)    Past Surgical History:  Procedure Laterality Date   BIOPSY  10/13/2017   Procedure: BIOPSY;  Surgeon: Wilhelmenia Aloha Raddle., MD;  Location: [facility];  Service: Gastroenterology;;   CESAREAN SECTION     x 3   COLONOSCOPY WITH PROPOFOL  N/A 10/13/2017   Procedure: COLONOSCOPY WITH PROPOFOL ;  Surgeon: Wilhelmenia Aloha Raddle., MD;  Location: [facility];  Service: Gastroenterology;  Laterality: N/A;   TONSILLECTOMY     age 34   TOTAL KNEE ARTHROPLASTY Bilateral 10/22/2008   Patient Active Problem List   Diagnosis Date Noted   Dehydration    Other ulcerative colitis with rectal bleeding (HCC)    Ulcerative colitis, acute (HCC) 10/12/2017   History of myocardial infarction 10/12/2017   AKI (acute kidney injury) 10/12/2017   Hyperglycemia 10/12/2017   Hyperlipidemia 10/12/2017   Thrombocytosis 10/12/2017   Leukocytosis 10/12/2017    PCP: SULA How, MD  REFERRING PROVIDER: Vernell Farm, MD  REFERRING DIAG: I89.0  THERAPY DIAG:  Lymphedema  Rationale for Evaluation and Treatment: Rehabilitation  ONSET DATE: ~ 1/2 years ago- I would say July, in 2023.  SUBJECTIVE:                                                                                                                                                                                            SUBJECTIVE STATEMENT: Elodia Haviland presents to Occupational Therapy for initial Rx visit. Pt denies LE related pain today. Pt reports she is wrapping compression between visits as directed. Pt discusses concern about worsening bunions, R>L, and how they are very painful and impair functional ambulation and mobility.  Pt states she plans to request a referral to her podiatrist to explore options for  bunionectomy since the injections are no longer effective.   PERTINENT HISTORY: TAKES AMLODIPINE; arthritis, HTN, B TKA 2010, Hx mi 2010, CVI, Obesity, CAD, reports recurrent episodes of cellulitis  PAIN:  Are you having pain? No LE -related pain in the R lower extremity. RLE pain in bunion only throbbing, pressure improves with Tylenol , elevation, soaking my feet, rubbing worsens with extended standing, walking and/ or dependent sitting > 15 minutes  PRECAUTIONS: Other: LYMPHEDEMA  WEIGHT BEARING RESTRICTIONS: No  FALLS:  Has patient fallen in last 6 months? No  LIVING ENVIRONMENT: Lives with: lives alone Lives in: House/apartment Stairs: No; level entry Has following equipment at home: None  OCCUPATION: part time LPN; retired full time LPN  LEISURE: garden, flowers, plants, watching sports  HAND DOMINANCE: right   PRIOR LEVEL OF FUNCTION: Independent  PATIENT GOALS: to reduce leg swelling, associated pain, and keep them from getting worse. To learn about lymphedema   OBJECTIVE: Note: Objective measures were completed at Evaluation unless otherwise noted.  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 SURGERY TYPE/DATE: Non-cancer  related INFECTIONS: recurrent cellulitis WOUNDS: +hx for L medial ankle ulcer 2/2 CVI  LYMPHEDEMA LIFE IMPACT SCALE (LLIS): 12/13/23 INITIAL: 29.41%   (The extent to which LE-related problems impacted your life in the last week.)  BLE COMPARATIVE LIMB VOLUMETRICS 12/13/23 INITIAL  LANDMARK RIGHT    R LEG (A-D) 3788.7 ml  R THIGH (E-G) ml  R FULL LIMB (A-G) ml  Limb Volume differential (LVD)  5.89%, R (Dom) > L  Volume change since initial %  Volume change overall V  (Blank rows = not tested)  LANDMARK LEFT    L LEG (A-D) 3566.4 ml  L THIGH (E-G) ml  L FULL LIMB (A-G) ml  Limb Volume differential (LVD)  %  Volume change since initial %  Volume change overall %  (Blank rows = not tested)    LLE COMPARATIVE LIMB VOLUMETRICS 6th Visit 01/10/24  LANDMARK LEFT    L LEG (A-D) 3672.5 ml  L THIGH (E-G) ml  L FULL LIMB (A-G) ml  Limb Volume differential (LVD)  %  Volume change since initial INCREASED 2.6% since 12/13/23  Volume change overall %  (Blank rows = not tested)    SKIN CONDITION/ TISSUE INTEGRITY:   Skin  Description Hyper Keratosis Peau d' Orange Shiny Tight Fibrotic/ Indurated Fatty/ doughy Lipo dermato sclerosis Spongy/ boggy   x x  x x  x x   Skin dry Flaky WNL Macerated   x x     Color Redness Varicosities Blanching Hemosiderin Stain Mottled   x x  x  x   Odor Malodorous Yeast Fungal infection  WNL      x   Temperature Warm Cool wnl    x     Pitting Edema   1+ 2+ 3+ 4+ Non-pitting         x   Girth Symmetrical Asymmetrical                   Distribution    L>R toes to groin    Stemmer Sign Positive Negative   +    Lymphorrhea History Of:  Present Absent   x  x    Wounds History Of Present Absent Venous Arterial Pressure Sheer   x  x x       Signs of Infection Odor Defined Border Erythema Acute Swelling Drainage                  Sensation Light Touch Deep pressure Hypersensitivity   In tact Impaired In tact Impaired Absent  Impaired   x x x  x     Nails WNL   Fungus nail dystrophy   TBA     Hair Growth Symmetrical Asymmetrical   x    Skin Creases Base of toes  Ankles   Base of Fingers knees       Abdominal pannus Thigh Lobules  Face/ neck   x x  x       TREATMENT THIS DATE:  BLE comparative limb volumetrics Multi layer compression wraps to L LEG  PATIENT EDUCATION:  Continued Pt/ CG edu for lymphedema self care home program throughout session. Topics include outcome of comparative limb volumetrics- starting limb volume differentials (LVDs), technology and gradient techniques used for short stretch, multilayer compression wrapping, simple self-MLD, therapeutic lymphatic pumping exercises, skin/nail care, LE precautions, compression garment recommendations and specifications, wear and care schedule and compression garment donning / doffing w assistive devices. Discussed progress towards all OT goals since commencing CDT. Discussed detrimental impact of obesity on lower and upper extremity lymphedema over time. Reviewed OT goals for lymphedema care with Pt and discussed progress to date.  All questions answered to the Pt's satisfaction. Good return. Person educated: Patient  Education method: Explanation, Demonstration, and Handouts Education comprehension: verbalized understanding, returned demonstration, verbal cues required, and needs further education   LYMPHEDEMA SELF-CARE HOME PROGRAM: BLE lymphatic pumping there ex- 1 set of 10 each element, in order. Hold 5. 2 x daily  2. 23/7 hours daily, short stretch, knee length, multilayer compression bandages to one leg at a time only during Intensive Phase CDT  3. During self-management phase of CDT Custom-made gradient compression garments and HOS devices are medically necessary because they are uniquely sized and shaped to fit the exact dimensions of the affected extremities, and to provide appropriate medical grade, graduated compression essential  for optimally managing chronic, progressive lymphedema. Multiple custom compression garments are needed to ensure proper hygiene to limit infection risk. Custom compression garments should be replaced q 3-6 months When worn consistently for optimal lymphedema self-management over time. HOS devices, medically necessary to limit fibrosis buildup in tissue, should be replaced q 2 years and PRN when worn out.     4. Daily skin care with low ph lotion matching skin ph  5.  Daily simple self MLD. Consider fitting Pt with the advanced, [employer] Flexitouch sequential, pneumatic , compression device for optimal lymphedema self care at home over time. [facility] research demonstrates that this device reduces infection risk of recurrent cellulitis.     ASSESSMENT: CLINICAL IMPRESSION: Pt reporting LLE lymphedema-related pain is resolved below the knee since commencing OT for CDT, but bunion pain has worsened. By visual assessment volume appears to be mildly reduced, but leg remains full and dense with spongy fibrosis.  R leg volumetrics reveals  R leg volume is  INCREASED 2.6% since last measured on 12/13/23. Increases appear in the calf region while the ankle is decreased in volume. Pt reminded not to wrapped too tightly thinking tighter is better because the opposite is true with lymphatic compression.Applied multilayer wraps as established with verbal review during demonstration. Cont as per POC.   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 She has a hx of slow healing wounds and recurrent cellulitis. Lymphedema limits her functional performance in all occupational domains, including basic and instrumental ADLs ( functional ambulation and mobility, LB dressing, productive activities and work, leisure pursuits, social participation and quality of life. Ms. Shoaf will benefit from skilled Occupational Therapy for Complete Decongestive  Therapy, (CDT), the gold standard protocol for lymphedema care. CDT includes manual lymphatic drainage, therapeutic lymphatic pumping exercise, skin care and compression therapies. The goals of CDT are to reduce limb swelling and associated pain,  to reduce infection risk and to limit lymphedema progression. Emphasis of Occupational Therapy throughout treatment course is on Pt education and self-care training for long term self management at home with OT support PRN. Without  OT for CDT lymphedema physical and sensory symptoms will progress and further functional decline is expected.)  OBJECTIVE IMPAIRMENTS: decreased knowledge of condition, decreased knowledge of use of DME, decreased mobility (impaired standing, walking and sitting tolerance) , increased chronic, progressive, edema and associated leg pain, impaired flexibility, increased infection risk.   ACTIVITY LIMITATIONS: functional mobility and ambulation (decreased standing, walking and dependent sitting tolerance < 15 minutes, , basic and instrumental ADLs  (LB dressing, ability to fit preferred street shoes, shopping, home management, cooking, driving) , productive activities (limits ability to perform work duties requiring prolonged standing, sitting and walking), leisure pursuits (limits ability to exercise), social participation (limits spending time with family and friends when prolonged standing, dependent sitting and/ or walking is required), quality of life ( self conscious about appearance of legs, disguises with clothing).  PERSONAL FACTORS: Time since onset of injury/illness/exacerbation, co morbidities (CVI, Obesity, HTN, OA), and occupational demands (standing, walking, dependent sitting) also patient's functional outcome.   REHAB POTENTIAL: Good  EVALUATION COMPLEXITY: Moderate   GOALS: Goals reviewed with patient? Yes   SHORT TERM GOALS: Target date: 4th OT Rx visit    Pt will demonstrate understanding of lymphedema  precautions and prevention strategies with modified independence using a printed reference to identify at least 5 precautions and discussing how s/he may implement them into daily life to reduce risk of progression with modified assistance Baseline: max a Goal status: INITIAL   2.  With modified independence (extra time) Pt will be able to apply multilayer, knee length, compression wraps using gradient techniques to decrease limb volume, to limit infection risk, and to limit lymphedema progression.   Baseline: Dependent Goal status: INITIAL     LONG TERM GOALS: Target date: 02/15/24     Given this patient's Intake score of TBA % on the Lymphedema Life Impact Scale (LLIS), patient will experience a reduction of at least 5% in her perceived level of functional impairment resulting from lymphedema to improve functional performance and quality of life (QOL).Baseline: tbd Baseline: max a Goal status: INITIAL   2.  With modified independence (extra time and assistive devices) Pt will be able to don and doff appropriate compression garments and/or devices to control BLE lymphedema and to limit progression.  Baseline: Dependent Goal status: INITIAL   3. Pt will achieve at least a 10% volume reductions bilaterally below the knees to return limb to more typical size and shape, to limit infection risk and LE progression, to decrease pain, to improve function, and to improve body image and QOL. Baseline: Dependent Goal status: INITIAL   5. Pt will achieve and sustain at least 85% attendance at OT sessions, and with compliance with all  LE self-care home program components throughout CDT, including modified simple self-MLD, daily skin care and inspection, lymphatic pumping the ex and appropriate compression to limit lymphedema progression and to limit further functional decline. Baseline: Dependent Goal status: INITIAL PLAN:   OT FREQUENCY: 2 x/week   OT DURATION: 12 weeks and PRN   PLANNED  INTERVENTIONS: CDT: Therapeutic exercise, MLD, skin care, simple self MLD, fit with appropriate compression garments/ devices, consider Flexitouch trial;  Therapeutic activity, Patient/Family education, Self Care, DME instructions,    PLAN FOR NEXT SESSION:  Anatomical measurements for BLE comparative limb volumetrics Pt EDU for LE self-care home program- There ex Compression wrap one leg given time  Zebedee Dec, MS, OTR/L, CLT-LANA 01/10/24 11:01 AM

## 2024-01-11 ENCOUNTER — Ambulatory Visit: Admitting: Occupational Therapy

## 2024-01-12 ENCOUNTER — Ambulatory Visit: Admitting: Occupational Therapy

## 2024-01-24 ENCOUNTER — Encounter: Payer: Self-pay | Admitting: Occupational Therapy

## 2024-01-24 ENCOUNTER — Ambulatory Visit: Attending: Student in an Organized Health Care Education/Training Program | Admitting: Occupational Therapy

## 2024-01-24 DIAGNOSIS — I890 Lymphedema, not elsewhere classified: Secondary | ICD-10-CM | POA: Insufficient documentation

## 2024-01-24 NOTE — Therapy (Signed)
 OUTPATIENT OCCUPATIONAL THERAPY TREATMENT NOTE  BILATERAL LOWER EXTREMITY/ BILATERAL LOWER QUADRANT  LYMPHEDEMA  Patient Name: [name] MRN: [id number] DOB:[birth year], 68 y.o., female Today's Date: 01/24/2024  END OF SESSION:   OT End of Session - 01/24/24 0825     Visit Number 7    Number of Visits 36    Date for Recertification  02/15/24    OT Start Time 0819    OT Stop Time 0905    OT Time Calculation (min) 46 min    Activity Tolerance Patient tolerated treatment well;No increased pain    Behavior During Therapy [facility] for tasks assessed/performed            Past Medical History:  Diagnosis Date   Arthritis    C. difficile diarrhea    Heart attack (HCC) 10/24/2008   HLD (hyperlipidemia)    HTN (hypertension)    UC (ulcerative colitis) (HCC)    Past Surgical History:  Procedure Laterality Date   BIOPSY  10/13/2017   Procedure: BIOPSY;  Surgeon: Wilhelmenia Aloha Raddle., MD;  Location: [facility];  Service: Gastroenterology;;   CESAREAN SECTION     x 3   COLONOSCOPY WITH PROPOFOL  N/A 10/13/2017   Procedure: COLONOSCOPY WITH PROPOFOL ;  Surgeon: Wilhelmenia Aloha Raddle., MD;  Location: [facility];  Service: Gastroenterology;  Laterality: N/A;   TONSILLECTOMY     age 16   TOTAL KNEE ARTHROPLASTY Bilateral 10/22/2008   Patient Active Problem List   Diagnosis Date Noted   Dehydration    Other ulcerative colitis with rectal bleeding (HCC)    Ulcerative colitis, acute (HCC) 10/12/2017   History of myocardial infarction 10/12/2017   AKI (acute kidney injury) 10/12/2017   Hyperglycemia 10/12/2017   Hyperlipidemia 10/12/2017   Thrombocytosis 10/12/2017   Leukocytosis 10/12/2017    PCP: SULA How, MD  REFERRING PROVIDER: Vernell Farm, MD  REFERRING DIAG: I89.0  THERAPY DIAG:  Lymphedema  Rationale for Evaluation and Treatment: Rehabilitation  ONSET DATE: ~ 1/2 years ago- I would say July, in 2023.  SUBJECTIVE:                                                                                                                                                                                            SUBJECTIVE STATEMENT: [name] presents to Occupational Therapy for lymphedema Rx visit. Pt is 19 minutes late for session. Pt denies LE related pain today. Pt reports she is wrapping her L leg with compression between visits as directed, leaving wraps off for 4 hours daily.  OT reviewed attendance policy and importance of arriving on time for full hour Rx . OT  reviewed LE self care for wrapping protocol including not going without compression wraps for more than an hour during Intensive Phase of Rx.    PERTINENT HISTORY: TAKES AMLODIPINE; arthritis, HTN, B TKA 2010, Hx mi 2010, CVI, Obesity, CAD, reports recurrent episodes of cellulitis  PAIN:  Are you having pain? No LE -related pain in the R lower extremity. RLE pain in bunion only throbbing, pressure improves with Tylenol , elevation, soaking my feet, rubbing worsens with extended standing, walking and/ or dependent sitting > 15 minutes  PRECAUTIONS: Other: LYMPHEDEMA  WEIGHT BEARING RESTRICTIONS: No  FALLS:  Has patient fallen in last 6 months? No  LIVING ENVIRONMENT: Lives with: lives alone Lives in: House/apartment Stairs: No; level entry Has following equipment at home: None  OCCUPATION: part time LPN; retired full time LPN  LEISURE: garden, flowers, plants, watching sports  HAND DOMINANCE: right   PRIOR LEVEL OF FUNCTION: Independent  PATIENT GOALS: to reduce leg swelling, associated pain, and keep them from getting worse. To learn about lymphedema   OBJECTIVE: Note: Objective measures were completed at Evaluation unless otherwise noted.  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 SURGERY TYPE/DATE: Non-cancer related INFECTIONS: recurrent cellulitis WOUNDS: +hx for L medial ankle ulcer 2/2 CVI  LYMPHEDEMA LIFE IMPACT SCALE (LLIS): 12/13/23 INITIAL: 29.41%   (The extent to which LE-related problems impacted your life in the last week.)  BLE COMPARATIVE LIMB VOLUMETRICS 12/13/23 INITIAL  LANDMARK RIGHT    R LEG (A-D) 3788.7 ml  R THIGH (E-G) ml  R FULL LIMB (A-G) ml  Limb Volume differential (LVD)  5.89%, R (Dom) > L  Volume change since initial %  Volume change overall V  (Blank rows = not tested)  LANDMARK LEFT    L LEG (A-D) 3566.4 ml  L THIGH (E-G) ml  L FULL LIMB (A-G) ml  Limb Volume differential (LVD)  %  Volume change since initial %  Volume change overall %  (Blank rows = not tested)    LLE COMPARATIVE LIMB VOLUMETRICS 6th Visit 01/10/24  LANDMARK LEFT    L LEG (A-D) 3672.5 ml  L THIGH (E-G) ml  L FULL LIMB (A-G) ml  Limb Volume differential (LVD)  %  Volume change since initial INCREASED 2.6% since 12/13/23  Volume change overall %  (Blank rows = not tested)    SKIN CONDITION/ TISSUE INTEGRITY:   Skin  Description Hyper Keratosis Peau d' Orange Shiny Tight Fibrotic/ Indurated Fatty/ doughy Lipo dermato sclerosis Spongy/ boggy   x x  x x  x x   Skin dry Flaky WNL Macerated   x x     Color Redness Varicosities Blanching Hemosiderin Stain Mottled   x x  x  x   Odor Malodorous Yeast Fungal infection  WNL      x   Temperature Warm Cool wnl    x     Pitting Edema   1+ 2+ 3+ 4+ Non-pitting         x   Girth Symmetrical Asymmetrical                   Distribution    L>R toes to groin    Stemmer Sign Positive Negative   +    Lymphorrhea History Of:  Present Absent   x  x    Wounds History Of Present Absent Venous Arterial Pressure Sheer   x  x x       Signs of Infection Odor Defined Border Erythema Acute Swelling Drainage                  Sensation Light Touch Deep pressure  Hypersensitivity   In tact Impaired In tact Impaired Absent Impaired   x x x  x     Nails WNL   Fungus nail dystrophy   TBA     Hair Growth Symmetrical Asymmetrical   x    Skin Creases Base of toes  Ankles   Base of Fingers knees       Abdominal pannus Thigh Lobules  Face/ neck   x x  x       TREATMENT THIS DATE:  BLE comparative limb volumetrics Multi layer compression wraps to L LEG  PATIENT EDUCATION:  Continued Pt/ CG edu for lymphedema self care home program throughout session. Topics include outcome of comparative limb volumetrics- starting limb volume differentials (LVDs), technology and gradient techniques used for short stretch, multilayer compression wrapping, simple self-MLD, therapeutic lymphatic pumping exercises, skin/nail care, LE precautions, compression garment recommendations and specifications, wear and care schedule and compression garment donning / doffing w assistive devices. Discussed progress towards all OT goals since commencing CDT. Discussed detrimental impact of obesity on lower and upper extremity lymphedema over time. Reviewed OT goals for lymphedema care with Pt and discussed progress to date.  All questions answered to the Pt's satisfaction. Good return. Person educated: Patient  Education method: Explanation, Demonstration, and Handouts Education comprehension: verbalized understanding, returned demonstration, verbal cues required, and needs further education   LYMPHEDEMA SELF-CARE HOME PROGRAM: BLE lymphatic pumping there ex- 1 set of 10 each element, in order. Hold 5. 2 x daily  2. 23/7 hours daily, short stretch, knee length, multilayer compression bandages to one leg at a time only during Intensive Phase CDT  3. During self-management phase of CDT Custom-made gradient compression garments and HOS devices are medically necessary because they are uniquely sized and shaped to fit the exact dimensions of the affected extremities, and to provide  appropriate medical grade, graduated compression essential for optimally managing chronic, progressive lymphedema. Multiple custom compression garments are needed to ensure proper hygiene to limit infection risk. Custom compression garments should be replaced q 3-6 months When worn consistently for optimal lymphedema self-management over time. HOS devices, medically necessary to limit fibrosis buildup in tissue, should be replaced q 2 years and PRN when worn out.     4. Daily skin care with low ph lotion matching skin ph  5.  Daily simple self MLD. Consider fitting Pt with the advanced, Tactile Medical Flexitouch sequential, pneumatic , compression device for optimal lymphedema self care at home over time. [facility] research demonstrates that this device reduces infection risk of recurrent cellulitis.     ASSESSMENT: CLINICAL IMPRESSION:  Noticed limited dorsiflexion and plantarflexion bilaterally, L>R, today during manual therapy. Pt taught flexibility stretches fro ankle AROM. We'll incorporate  this into manual therapy going forward to increase effectiveness of calf pump and improve balance on uneven terrain. Continued MLD as established with fibrosis techniques to medial and laterally ankle. No wraps applied in clinic today due to time constraints. Pt will apply at home after session. Cont as per POC.  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 She  has a hx of slow healing wounds and recurrent cellulitis. Lymphedema limits her functional performance in all occupational domains, including basic and instrumental ADLs ( functional ambulation and mobility, LB dressing, productive activities and work, leisure pursuits, social participation and quality of life. Ms. Ballester will benefit from skilled Occupational Therapy for Complete Decongestive Therapy, (CDT), the gold standard protocol for lymphedema care. CDT includes manual lymphatic  drainage, therapeutic lymphatic pumping exercise, skin care and compression therapies. The goals of CDT are to reduce limb swelling and associated pain,  to reduce infection risk and to limit lymphedema progression. Emphasis of Occupational Therapy throughout treatment course is on Pt education and self-care training for long term self management at home with OT support PRN. Without  OT for CDT lymphedema physical and sensory symptoms will progress and further functional decline is expected.)  OBJECTIVE IMPAIRMENTS: decreased knowledge of condition, decreased knowledge of use of DME, decreased mobility (impaired standing, walking and sitting tolerance) , increased chronic, progressive, edema and associated leg pain, impaired flexibility, increased infection risk.   ACTIVITY LIMITATIONS: functional mobility and ambulation (decreased standing, walking and dependent sitting tolerance < 15 minutes, , basic and instrumental ADLs  (LB dressing, ability to fit preferred street shoes, shopping, home management, cooking, driving) , productive activities (limits ability to perform work duties requiring prolonged standing, sitting and walking), leisure pursuits (limits ability to exercise), social participation (limits spending time with family and friends when prolonged standing, dependent sitting and/ or walking is required), quality of life ( self conscious about appearance of legs, disguises with clothing).  PERSONAL FACTORS: Time since onset of injury/illness/exacerbation, co morbidities (CVI, Obesity, HTN, OA), and occupational demands (standing, walking, dependent sitting) also patient's functional outcome.   REHAB POTENTIAL: Good  EVALUATION COMPLEXITY: Moderate   GOALS: Goals reviewed with patient? Yes   SHORT TERM GOALS: Target date: 4th OT Rx visit    Pt will demonstrate understanding of lymphedema precautions and prevention strategies with modified independence using a printed reference to  identify at least 5 precautions and discussing how s/he may implement them into daily life to reduce risk of progression with modified assistance Baseline: max a Goal status: INITIAL   2.  With modified independence (extra time) Pt will be able to apply multilayer, knee length, compression wraps using gradient techniques to decrease limb volume, to limit infection risk, and to limit lymphedema progression.   Baseline: Dependent Goal status: INITIAL     LONG TERM GOALS: Target date: 02/15/24     Given this patient's Intake score of TBA % on the Lymphedema Life Impact Scale (LLIS), patient will experience a reduction of at least 5% in her perceived level of functional impairment resulting from lymphedema to improve functional performance and quality of life (QOL).Baseline: tbd Baseline: max a Goal status: INITIAL   2.  With modified independence (extra time and assistive devices) Pt will be able to don and doff appropriate compression garments and/or devices to control BLE lymphedema and to limit progression.  Baseline: Dependent Goal status: INITIAL   3. Pt will achieve at least a 10% volume reductions bilaterally below the knees to return limb to more typical size and shape, to limit infection risk and LE progression, to decrease pain, to improve function, and to improve body image and QOL. Baseline: Dependent Goal status: INITIAL   5. Pt will achieve and sustain at least 85% attendance at OT sessions, and with compliance with all LE self-care home program components throughout CDT, including modified simple self-MLD, daily  skin care and inspection, lymphatic pumping the ex and appropriate compression to limit lymphedema progression and to limit further functional decline. Baseline: Dependent Goal status: INITIAL PLAN:   OT FREQUENCY: 2 x/week   OT DURATION: 12 weeks and PRN   PLANNED INTERVENTIONS: CDT: Therapeutic exercise, MLD, skin care, simple self MLD, fit with appropriate  compression garments/ devices, consider Flexitouch trial;  Therapeutic activity, Patient/Family education, Self Care, DME instructions,    PLAN FOR NEXT SESSION:  Anatomical measurements for BLE comparative limb volumetrics Pt EDU for LE self-care home program- There ex Compression wrap one leg given time  Zebedee Dec, MS, OTR/L, CLT-LANA 01/24/24 9:05 AM

## 2024-01-28 ENCOUNTER — Ambulatory Visit: Admitting: Occupational Therapy

## 2024-01-31 ENCOUNTER — Emergency Department (HOSPITAL_COMMUNITY)

## 2024-01-31 ENCOUNTER — Emergency Department (HOSPITAL_COMMUNITY)
Admission: EM | Admit: 2024-01-31 | Discharge: 2024-01-31 | Disposition: A | Discharge status: Home / Self Care | Attending: Emergency Medicine | Admitting: Emergency Medicine

## 2024-01-31 ENCOUNTER — Other Ambulatory Visit: Payer: Self-pay

## 2024-01-31 DIAGNOSIS — J101 Influenza due to other identified influenza virus with other respiratory manifestations: Secondary | ICD-10-CM

## 2024-01-31 DIAGNOSIS — J189 Pneumonia, unspecified organism: Secondary | ICD-10-CM

## 2024-01-31 LAB — RESP PANEL BY RT-PCR (RSV, FLU A&B, COVID)  RVPGX2
Influenza A by PCR: POSITIVE — AB
Influenza B by PCR: NEGATIVE
Resp Syncytial Virus by PCR: NEGATIVE
SARS Coronavirus 2 by RT PCR: NEGATIVE

## 2024-01-31 MED ORDER — AMOXICILLIN-POT CLAVULANATE 875-125 MG PO TABS: 1.0000 | ORAL_TABLET | Freq: Two times a day (BID) | ORAL | 0 refills | Status: AC

## 2024-01-31 MED ORDER — AZITHROMYCIN 250 MG PO TABS: 250.0000 mg | ORAL_TABLET | Freq: Every day | ORAL | 0 refills | Status: AC

## 2024-01-31 MED ORDER — KETOROLAC TROMETHAMINE 15 MG/ML IJ SOLN
15.0000 mg | Freq: Once | INTRAMUSCULAR | Status: AC
  Administered 2024-01-31: 15 mg via INTRAMUSCULAR
  Filled 2024-01-31: qty 1

## 2024-01-31 NOTE — ED Triage Notes (Signed)
 Pt ambulatory to triage with complaints of a productive cough with white mucus, and a headache that began on Friday. Pt reports that she feels short of breath and light headed from coughing frequently. Breathing noted to be labored during triage.

## 2024-01-31 NOTE — ED Provider Notes (Signed)
 [place] EMERGENCY DEPARTMENT AT [facility] Provider Note   CSN: [id number] Arrival date & time: 01/31/24  1130     Patient presents with: Cough and Shortness of Breath   [name] is a 68 y.o. female.    Cough Associated symptoms: shortness of breath   Shortness of Breath Associated symptoms: cough   Presents with cough shortness of breath.  Productive cough somewhat.  Gets.  Has had symptoms around 3 or 4 days.  Has had negative COVID test at home.  Also headache.  Some shortness of breath and lightheadedness.  Worse with walking.  Denies underlying lung issues.    Past Medical History:  Diagnosis Date   Arthritis    C. difficile diarrhea    Heart attack (HCC) 10/24/2008   HLD (hyperlipidemia)    HTN (hypertension)    UC (ulcerative colitis) (HCC)     Prior to Admission medications   Medication Sig Start Date End Date Taking? Authorizing Provider  amoxicillin -clavulanate (AUGMENTIN ) 875-125 MG tablet Take 1 tablet by mouth every 12 (twelve) hours. 01/31/24  Yes Patsey Lot, MD  azithromycin  (ZITHROMAX ) 250 MG tablet Take 1 tablet (250 mg total) by mouth daily. Take first 2 tablets together, then 1 every day until finished. 01/31/24  Yes Patsey Lot, MD  acetaminophen  (TYLENOL ) 500 MG tablet Take 500 mg by mouth every 6 (six) hours as needed (For arthritis pain.).     [provider]  Adalimumab  (HUMIRA  PEN) 40 MG/0.4ML PNKT Inject 40 mg into the skin every 14 (fourteen) days. After starter kit. Patient not taking: Reported on 09/16/2022 11/12/17   Legrand Victory LITTIE DOUGLAS, MD  Adalimumab  (HUMIRA  PEN-CD/UC/HS STARTER) 40 MG/0.8ML PNKT Inject 40 mg into the skin as directed. Needs starter pack: Inject 160 mg Dalton on day 1, 80 mg Malo on day 15, then maintenance dose. Patient not taking: Reported on 09/16/2022 11/18/17   Legrand Victory LITTIE DOUGLAS, MD  Calcium Carb-Cholecalciferol (CALCIUM-VITAMIN D ) 600-400 MG-UNIT TABS Take 1 tablet by mouth daily.     [provider]  dicyclomine  (BENTYL ) 10 MG capsule Take 1 capsule (10 mg total) by mouth every 8 (eight) hours as needed for spasms. 10/08/17   Legrand Victory LITTIE DOUGLAS, MD  furosemide  (LASIX ) 20 MG tablet Take 1 tablet (20 mg total) by mouth daily. 07/22/23   Pamella Ozell LABOR, DO  hydrochlorothiazide  (HYDRODIURIL ) 25 MG tablet Take 1 tablet (25 mg total) by mouth daily. 03/20/23   Davis, Jonathon H, MD  hydrocortisone  (ANUSOL -HC) 25 MG suppository Place 1 suppository (25 mg total) rectally 2 (two) times daily. 10/18/17   Sebastian Toribio GAILS, MD  metoprolol  succinate (TOPROL -XL) 50 MG 24 hr tablet Take 50 mg by mouth daily. Take with or immediately following a meal.    [provider]  Multiple Vitamin (MULTIVITAMIN) tablet Take 1 tablet by mouth daily.    [provider]  ondansetron  (ZOFRAN ) 4 MG tablet Take 1 tablet (4 mg total) by mouth every 8 (eight) hours as needed for nausea or vomiting. 10/08/17   Danis, Victory LITTIE DOUGLAS, MD  ondansetron  (ZOFRAN -ODT) 4 MG disintegrating tablet Take 1 tablet (4 mg total) by mouth every 8 (eight) hours as needed for nausea or vomiting. 04/28/22   Towana Ozell BROCKS, MD  ondansetron  (ZOFRAN -ODT) 4 MG disintegrating tablet Take 1 tablet (4 mg total) by mouth every 8 (eight) hours as needed for nausea or vomiting. 04/30/22   Zackowski, Scott, MD  oxyCODONE -acetaminophen  (PERCOCET/ROXICET) 5-325 MG tablet Take 1  tablet by mouth every 6 (six) hours as needed for severe pain. Patient not taking: Reported on 09/16/2022 04/28/22   Towana Ozell BROCKS, MD  predniSONE  (DELTASONE ) 20 MG tablet Take 1-2 tablets (20-40 mg total) by mouth daily with breakfast. Take 2 tablets (40mg ) daily x 14 days, then 1.5 tablets(30mg ) daily x 14 days, then 1 tablets (20mg ) daily 10/19/17   Sebastian Toribio GAILS, MD  tamsulosin  (FLOMAX ) 0.4 MG CAPS capsule Take 1 capsule (0.4 mg total) by mouth daily. Patient not taking: Reported on 09/16/2022 04/28/22   Towana Ozell BROCKS, MD    Allergies:  Prednisone     Review of Systems  Respiratory:  Positive for cough and shortness of breath.     Updated Vital Signs BP (!) 157/90 (BP Location: Left Arm)   Pulse 90   Temp 97.7 F (36.5 C) (Oral)   Resp (!) 22   Ht 5' 6 (1.676 m)   Wt 100.7 kg   SpO2 98%   BMI 35.83 kg/m   Physical Exam Vitals and nursing note reviewed.  Pulmonary:     Effort: Tachypnea present.     Breath sounds: No wheezing or rhonchi.  Chest:     Chest wall: No tenderness.  Musculoskeletal:     Right lower leg: No edema.     Left lower leg: No edema.  Neurological:     Mental Status: She is alert.     (all labs ordered are listed, but only abnormal results are displayed) Labs Reviewed  RESP PANEL BY RT-PCR (RSV, FLU A&B, COVID)  [id number] - Abnormal; Notable for the following components:      Result Value   Influenza A by PCR POSITIVE (*)    All other components within normal limits    EKG: None  Radiology: DG Chest Portable 1 View Result Date: 01/31/2024 CLINICAL DATA:  Cough, headache. EXAM: PORTABLE CHEST 1 VIEW COMPARISON:  07/22/2023 and CT chest 07/22/2023. FINDINGS: Trachea is midline. Heart is enlarged. Opacification in the right upper lobe. Lungs are otherwise clear. No pleural fluid. IMPRESSION: New airspace opacification in the right upper lobe is likely due to pneumonia. Followup PA and lateral chest X-ray is recommended in 3-4 weeks following trial of antibiotic therapy to ensure resolution and exclude underlying malignancy. Electronically Signed   By: Newell Eke M.D.   On: 01/31/2024 12:56     Procedures   Medications Ordered in the ED - No data to display                                  Medical Decision Making Amount and/or Complexity of Data Reviewed Radiology: ordered.   Patient with URI symptoms cough.  Some myalgias.  Also headache.  History of ulcerative colitis.  Doubt meningitis.  Pneumonia and viral infection considered.  Will get x-ray and flu  testing.  Reviewed previous blood work and is really not had recent abnormal findings.  Flu testing is positive.  More than 48 hours of symptoms so no antivirals ,however x-ray does show potential pneumonia.  Potentially could be flu pneumonia.  Still I think has criteria for discharge home.  However I think with the findings on x-ray we will also give antibiotics.  Return precautions given.     Final diagnoses:  Influenza A  Community acquired pneumonia of right upper lobe of lung    ED Discharge Orders  Ordered    azithromycin  (ZITHROMAX ) 250 MG tablet  Daily        01/31/24 1310    amoxicillin -clavulanate (AUGMENTIN ) 875-125 MG tablet  Every 12 hours        01/31/24 1310               Patsey Lot, MD 01/31/24 1310

## 2024-01-31 NOTE — Discharge Instructions (Addendum)
 Your flu test was positive but you also may have pneumonia.  Since we cannot tell if it is viral versus bacterial we will treat with some antibiotics.  Return for worsening symptoms.

## 2024-01-31 NOTE — ED Notes (Signed)
 Respiratory swab sent.  Dr to see patient.

## 2024-01-31 NOTE — ED Notes (Signed)
 Pt up to bathroom sob with exertion.

## 2024-02-01 ENCOUNTER — Ambulatory Visit: Admitting: Occupational Therapy

## 2024-02-03 ENCOUNTER — Ambulatory Visit: Admitting: Occupational Therapy

## 2024-02-07 ENCOUNTER — Ambulatory Visit: Admitting: Occupational Therapy

## 2024-02-09 ENCOUNTER — Ambulatory Visit: Admitting: Occupational Therapy

## 2024-02-09 ENCOUNTER — Other Ambulatory Visit (HOSPITAL_BASED_OUTPATIENT_CLINIC_OR_DEPARTMENT_OTHER): Payer: Self-pay | Admitting: Student in an Organized Health Care Education/Training Program

## 2024-02-09 DIAGNOSIS — J189 Pneumonia, unspecified organism: Secondary | ICD-10-CM

## 2024-02-25 ENCOUNTER — Encounter: Admitting: Occupational Therapy

## 2024-02-28 ENCOUNTER — Ambulatory Visit: Attending: Student in an Organized Health Care Education/Training Program | Admitting: Occupational Therapy

## 2024-03-01 ENCOUNTER — Ambulatory Visit: Admitting: Occupational Therapy

## 2024-03-07 ENCOUNTER — Ambulatory Visit (HOSPITAL_BASED_OUTPATIENT_CLINIC_OR_DEPARTMENT_OTHER)
Admission: RE | Admit: 2024-03-07 | Discharge: 2024-03-07 | Disposition: A | Discharge status: Home / Self Care | Source: Ambulatory Visit | Attending: Student in an Organized Health Care Education/Training Program | Admitting: Student in an Organized Health Care Education/Training Program

## 2024-03-07 ENCOUNTER — Encounter: Admitting: Occupational Therapy

## 2024-03-07 DIAGNOSIS — J189 Pneumonia, unspecified organism: Secondary | ICD-10-CM | POA: Diagnosis present

## 2024-03-09 ENCOUNTER — Encounter: Admitting: Occupational Therapy

## 2024-03-14 ENCOUNTER — Encounter: Admitting: Occupational Therapy

## 2024-03-16 ENCOUNTER — Encounter: Admitting: Occupational Therapy

## 2024-03-21 ENCOUNTER — Encounter: Admitting: Occupational Therapy

## 2024-03-23 ENCOUNTER — Encounter: Admitting: Occupational Therapy
